# Patient Record
Sex: Male | Born: 1955 | Race: White | Hispanic: No | Marital: Married | Smoking: Never smoker
Health system: Southern US, Community
[De-identification: ages and names within clinical notes are randomized; demographics above are authoritative.]

## PROBLEM LIST (undated history)

## (undated) DIAGNOSIS — I219 Acute myocardial infarction, unspecified: Secondary | ICD-10-CM

## (undated) DIAGNOSIS — G4730 Sleep apnea, unspecified: Secondary | ICD-10-CM

## (undated) DIAGNOSIS — K649 Unspecified hemorrhoids: Secondary | ICD-10-CM

## (undated) DIAGNOSIS — E785 Hyperlipidemia, unspecified: Secondary | ICD-10-CM

## (undated) HISTORY — PX: SPINE SURGERY: SHX786

## (undated) HISTORY — DX: Acute myocardial infarction, unspecified: I21.9

## (undated) HISTORY — DX: Hyperlipidemia, unspecified: E78.5

## (undated) HISTORY — PX: FRACTURE SURGERY: SHX138

---
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
## 2007-04-17 DIAGNOSIS — I219 Acute myocardial infarction, unspecified: Secondary | ICD-10-CM

## 2007-04-17 HISTORY — PX: CARDIAC CATHETERIZATION: SHX172

## 2007-04-17 HISTORY — DX: Acute myocardial infarction, unspecified: I21.9

## 2018-04-17 ENCOUNTER — Ambulatory Visit: Payer: BLUE CROSS/BLUE SHIELD | Admitting: Emergency Medicine

## 2018-04-17 ENCOUNTER — Encounter: Payer: Self-pay | Admitting: Emergency Medicine

## 2018-04-17 VITALS — BP 146/92 | HR 60 | Temp 97.8°F | Resp 17 | Ht 70.5 in | Wt 213.0 lb

## 2018-04-17 DIAGNOSIS — Z8679 Personal history of other diseases of the circulatory system: Secondary | ICD-10-CM | POA: Diagnosis not present

## 2018-04-17 DIAGNOSIS — E785 Hyperlipidemia, unspecified: Secondary | ICD-10-CM | POA: Diagnosis not present

## 2018-04-17 DIAGNOSIS — Z7689 Persons encountering health services in other specified circumstances: Secondary | ICD-10-CM

## 2018-04-17 NOTE — Patient Instructions (Signed)

## 2018-04-17 NOTE — Progress Notes (Signed)
Johnny Boyer 63 y.o.   Chief Complaint  Patient presents with  . Establish Care    HISTORY OF PRESENT ILLNESS: This is a 63 y.o. male here to establish care.  Recently moved to this area from [place].  Has a history of coronary artery disease, status post MI 2009, had 2 stents placed.  Also has a history of high cholesterol and sleep apnea.  HPI   Prior to Admission medications   Medication Sig Start Date End Date Taking? Authorizing Provider  aspirin EC 81 MG tablet Take 81 mg by mouth daily.   Yes [provider]  atorvastatin (LIPITOR) 80 MG tablet Take 80 mg by mouth daily.   Yes [provider]    Not on File  There are no active problems to display for this patient.   Past Medical History:  Diagnosis Date  . Hyperlipidemia   . Myocardial infarction [facility](HCC)       Social History   Socioeconomic History  . Marital status: Married    Spouse name: Not on file  . Number of children: Not on file  . Years of education: Not on file  . Highest education level: Not on file  Occupational History  . Not on file  Social Needs  . Financial resource strain: Not on file  . Food insecurity:    Worry: Not on file    Inability: Not on file  . Transportation needs:    Medical: Not on file    Non-medical: Not on file  Tobacco Use  . Smoking status: Never Smoker  . Smokeless tobacco: Never Used  Substance and Sexual Activity  . Alcohol use: Not on file  . Drug use: Never  . Sexual activity: Never  Lifestyle  . Physical activity:    Days per week: Not on file    Minutes per session: Not on file  . Stress: Not on file  Relationships  . Social connections:    Talks on phone: Not on file    Gets together: Not on file    Attends religious service: Not on file    Active member of club or organization: Not on file    Attends meetings of clubs or organizations: Not on file    Relationship status: Not on file  . Intimate partner violence:    Fear of  current or ex partner: Not on file    Emotionally abused: Not on file    Physically abused: Not on file    Forced sexual activity: Not on file  Other Topics Concern  . Not on file  Social History Narrative  . Not on file    Family History  Problem Relation Age of Onset  . Heart disease Mother   . Hyperlipidemia Mother   . Hyperlipidemia Sister   . Hypertension Sister   . Hyperlipidemia Brother   . Hypertension Brother      Review of Systems  Constitutional: Negative.  Negative for chills and fever.  HENT: Negative.  Negative for sore throat.   Eyes: Negative.  Negative for blurred vision and double vision.  Respiratory: Negative for cough, hemoptysis and shortness of breath.   Cardiovascular: Negative.  Negative for chest pain and palpitations.  Gastrointestinal: Negative.  Negative for abdominal pain, diarrhea, nausea and vomiting.  Genitourinary: Negative.   Skin: Negative.  Negative for rash.  Neurological: Negative for dizziness and headaches.  Endo/Heme/Allergies: Negative.   All other systems reviewed and are negative.   Vitals:   04/17/18  1542  BP: (!) 146/92  Pulse: 60  Resp: 17  Temp: 97.8 F (36.6 C)  SpO2: 98%    Physical Exam Vitals signs reviewed.  Constitutional:      Appearance: Normal appearance.  HENT:     Head: Normocephalic and atraumatic.     Nose: Nose normal.     Mouth/Throat:     Mouth: Mucous membranes are moist.     Pharynx: Oropharynx is clear.  Eyes:     Extraocular Movements: Extraocular movements intact.     Conjunctiva/sclera: Conjunctivae normal.     Pupils: Pupils are equal, round, and reactive to light.  Neck:     Musculoskeletal: Normal range of motion and neck supple.  Cardiovascular:     Rate and Rhythm: Normal rate and regular rhythm.     Heart sounds: Normal heart sounds.  Pulmonary:     Effort: Pulmonary effort is normal.     Breath sounds: Normal breath sounds.  Neurological:     Mental Status: He is alert.       ASSESSMENT & PLAN: Johnny Boyer was seen today for establish care.  Diagnoses and all orders for this visit:  Encounter to establish care  Dyslipidemia  History of coronary artery disease    Patient Instructions  Health Maintenance, Male A healthy lifestyle and preventive care is important for your health and wellness. Ask your health care provider about what schedule of regular examinations is right for you. What should I know about weight and diet? Eat a Healthy Diet  Eat plenty of vegetables, fruits, whole grains, low-fat dairy products, and lean protein.  Do not eat a lot of foods high in solid fats, added sugars, or salt.  Maintain a Healthy Weight Regular exercise can help you achieve or maintain a healthy weight. You should:  Do at least 150 minutes of exercise each week. The exercise should increase your heart rate and make you sweat (moderate-intensity exercise).  Do strength-training exercises at least twice a week. Watch Your Levels of Cholesterol and Blood Lipids  Have your blood tested for lipids and cholesterol every 5 years starting at 64 years of age. If you are at high risk for heart disease, you should start having your blood tested when you are 64 years old. You may need to have your cholesterol levels checked more often if: ? Your lipid or cholesterol levels are high. ? You are older than 64 years of age. ? You are at high risk for heart disease. What should I know about cancer screening? Many types of cancers can be detected early and may often be prevented. Lung Cancer  You should be screened every year for lung cancer if: ? You are a current smoker who has smoked for at least 30 years. ? You are a former smoker who has quit within the past 15 years.  Talk to your health care provider about your screening options, when you should start screening, and how often you should be screened. Colorectal Cancer  Routine colorectal cancer screening usually  begins at 64 years of age and should be repeated every 5-10 years until you are 64 years old. You may need to be screened more often if early forms of precancerous polyps or small growths are found. Your health care provider may recommend screening at an earlier age if you have risk factors for colon cancer.  Your health care provider may recommend using home test kits to check for hidden blood in the stool.  A small  camera at the end of a tube can be used to examine your colon (sigmoidoscopy or colonoscopy). This checks for the earliest forms of colorectal cancer. Prostate and Testicular Cancer  Depending on your age and overall health, your health care provider may do certain tests to screen for prostate and testicular cancer.  Talk to your health care provider about any symptoms or concerns you have about testicular or prostate cancer. Skin Cancer  Check your skin from head to toe regularly.  Tell your health care provider about any new moles or changes in moles, especially if: ? There is a change in a mole's size, shape, or color. ? You have a mole that is larger than a pencil eraser.  Always use sunscreen. Apply sunscreen liberally and repeat throughout the day.  Protect yourself by wearing long sleeves, pants, a wide-brimmed hat, and sunglasses when outside. What should I know about heart disease, diabetes, and high blood pressure?  If you are 63-57 years of age, have your blood pressure checked every 3-5 years. If you are 63 years of age or older, have your blood pressure checked every year. You should have your blood pressure measured twice-once when you are at a hospital or clinic, and once when you are not at a hospital or clinic. Record the average of the two measurements. To check your blood pressure when you are not at a hospital or clinic, you can use: ? An automated blood pressure machine at a pharmacy. ? A home blood pressure monitor.  Talk to your health care provider  about your target blood pressure.  If you are between 28-34 years old, ask your health care provider if you should take aspirin to prevent heart disease.  Have regular diabetes screenings by checking your fasting blood sugar level. ? If you are at a normal weight and have a low risk for diabetes, have this test once every three years after the age of 28. ? If you are overweight and have a high risk for diabetes, consider being tested at a younger age or more often.  A one-time screening for abdominal aortic aneurysm (AAA) by ultrasound is recommended for men aged 65-75 years who are current or former smokers. What should I know about preventing infection? Hepatitis B If you have a higher risk for hepatitis B, you should be screened for this virus. Talk with your health care provider to find out if you are at risk for hepatitis B infection. Hepatitis C Blood testing is recommended for:  Everyone born from [birth year] through 1966.  Anyone with known risk factors for hepatitis C. Sexually Transmitted Diseases (STDs)  You should be screened each year for STDs including gonorrhea and chlamydia if: ? You are sexually active and are younger than 64 years of age. ? You are older than 64 years of age and your health care provider tells you that you are at risk for this type of infection. ? Your sexual activity has changed since you were last screened and you are at an increased risk for chlamydia or gonorrhea. Ask your health care provider if you are at risk.  Talk with your health care provider about whether you are at high risk of being infected with HIV. Your health care provider may recommend a prescription medicine to help prevent HIV infection. What else can I do?  Schedule regular health, dental, and eye exams.  Stay current with your vaccines (immunizations).  Do not use any tobacco products, such as cigarettes, chewing tobacco,  and e-cigarettes. If you need help quitting, ask your health  care provider.  Limit alcohol intake to no more than 2 drinks per day. One drink equals 12 ounces of beer, 5 ounces of wine, or 1 ounces of hard liquor.  Do not use street drugs.  Do not share needles.  Ask your health care provider for help if you need support or information about quitting drugs.  Tell your health care provider if you often feel depressed.  Tell your health care provider if you have ever been abused or do not feel safe at home. This information is not intended to replace advice given to you by your health care provider. Make sure you discuss any questions you have with your health care provider. Document Released: 09/29/2007 Document Revised: 11/30/2015 Document Reviewed: 01/04/2015 Elsevier Interactive Patient Education  2019 Elsevier Inc.      Edwina BarthMiguel Charlot Gouin, MD [facility]

## 2018-06-05 DIAGNOSIS — J00 Acute nasopharyngitis [common cold]: Secondary | ICD-10-CM | POA: Diagnosis not present

## 2018-12-03 ENCOUNTER — Encounter: Payer: Self-pay | Admitting: Emergency Medicine

## 2018-12-03 ENCOUNTER — Telehealth (INDEPENDENT_AMBULATORY_CARE_PROVIDER_SITE_OTHER): Payer: BC Managed Care – PPO | Admitting: Emergency Medicine

## 2018-12-03 ENCOUNTER — Other Ambulatory Visit: Payer: Self-pay

## 2018-12-03 VITALS — Ht 69.0 in | Wt 210.0 lb

## 2018-12-03 DIAGNOSIS — E785 Hyperlipidemia, unspecified: Secondary | ICD-10-CM

## 2018-12-03 DIAGNOSIS — Z8679 Personal history of other diseases of the circulatory system: Secondary | ICD-10-CM

## 2018-12-03 DIAGNOSIS — Z1211 Encounter for screening for malignant neoplasm of colon: Secondary | ICD-10-CM | POA: Diagnosis not present

## 2018-12-03 DIAGNOSIS — K644 Residual hemorrhoidal skin tags: Secondary | ICD-10-CM | POA: Diagnosis not present

## 2018-12-03 MED ORDER — HYDROCORTISONE (PERIANAL) 2.5 % EX CREA: 1.0000 "application " | TOPICAL_CREAM | Freq: Two times a day (BID) | CUTANEOUS | 0 refills | Status: DC

## 2018-12-03 NOTE — Progress Notes (Signed)
Telemedicine Encounter- SOAP NOTE Established Patient  This telephone encounter was conducted with the patient's (or proxy's) verbal consent via audio telecommunications: yes/no: Yes Patient was instructed to have this encounter in a suitably private space; and to only have persons present to whom they give permission to participate. In addition, patient identity was confirmed by use of name plus two identifiers (DOB and address).  I discussed the limitations, risks, security and privacy concerns of performing an evaluation and management service by telephone and the availability of in person appointments. I also discussed with the patient that there may be a patient responsible charge related to this service. The patient expressed understanding and agreed to proceed.  I spent a total of TIME; 0 MIN TO 60 MIN: 15 minutes talking with the patient or their proxy.  No chief complaint on file. Requesting blood work  Subjective   Johnny Boyer is a 63 y.o. male established patient. Telephone visit today for follow-up of chronic medical problems.  Requesting blood work.  Patient has history of coronary artery disease and dyslipidemia.  On atorvastatin. Also inquiring about Cologuard. Also complaining of painful external hemorrhoids. No other complaints or medical concerns today.  HPI   There are no active problems to display for this patient.   Past Medical History:  Diagnosis Date  . Hyperlipidemia   . Myocardial infarction [facility])     Current Outpatient Medications  Medication Sig Dispense Refill  . aspirin EC 81 MG tablet Take 81 mg by mouth daily.    Marland Kitchen atorvastatin (LIPITOR) 80 MG tablet Take 80 mg by mouth daily.     No current facility-administered medications for this visit.     No Known Allergies  Social History   Socioeconomic History  . Marital status: Married    Spouse name: Not on file  . Number of children: Not on file  . Years of education: Not on file  .  Highest education level: Not on file  Occupational History  . Not on file  Social Needs  . Financial resource strain: Not on file  . Food insecurity    Worry: Not on file    Inability: Not on file  . Transportation needs    Medical: Not on file    Non-medical: Not on file  Tobacco Use  . Smoking status: Never Smoker  . Smokeless tobacco: Never Used  Substance and Sexual Activity  . Alcohol use: Not on file  . Drug use: Never  . Sexual activity: Never  Lifestyle  . Physical activity    Days per week: Not on file    Minutes per session: Not on file  . Stress: Not on file  Relationships  . Social Herbalist on phone: Not on file    Gets together: Not on file    Attends religious service: Not on file    Active member of club or organization: Not on file    Attends meetings of clubs or organizations: Not on file    Relationship status: Not on file  . Intimate partner violence    Fear of current or ex partner: Not on file    Emotionally abused: Not on file    Physically abused: Not on file    Forced sexual activity: Not on file  Other Topics Concern  . Not on file  Social History Narrative  . Not on file    Review of Systems  Constitutional: Negative.  Negative for chills and fever.  HENT: Negative for congestion and sore throat.   Respiratory: Negative.  Negative for cough and shortness of breath.   Cardiovascular: Negative.  Negative for chest pain and palpitations.  Gastrointestinal:       Painful external hemorrhoids  Genitourinary: Negative.   Musculoskeletal: Negative.  Negative for myalgias.  Skin: Negative for rash.  Neurological: Negative for dizziness and headaches.  All other systems reviewed and are negative.   Objective   Vitals as reported by the patient: Today's Vitals   12/03/18 1320  Weight: 210 lb (95.3 kg)  Height: 5\' 9"  (1.753 m)  Alert and oriented x3 in no apparent respiratory distress  There are no diagnoses linked to this  encounter.  Diagnoses and all orders for this visit:  External hemorrhoids -     hydrocortisone (ANUSOL-HC) 2.5 % rectal cream; Place 1 application rectally 2 (two) times daily. -     CBC with Differential/Platelet; Future  Dyslipidemia -     Hemoglobin A1c; Future -     Comprehensive metabolic panel; Future -     Lipid panel; Future  History of coronary artery disease -     Hemoglobin A1c; Future -     Comprehensive metabolic panel; Future  Colon cancer screening -     Cologuard; Future    Clinically stable.  No medical concerns identified during this visit. Blood work requested.  Cologuard requested. Office visit in 6 months.  I discussed the assessment and treatment plan with the patient. The patient was provided an opportunity to ask questions and all were answered. The patient agreed with the plan and demonstrated an understanding of the instructions.   The patient was advised to call back or seek an in-person evaluation if the symptoms worsen or if the condition fails to improve as anticipated.  I provided 15 minutes of non-face-to-face time during this encounter.  Georgina QuintMiguel Jose Fatema Rabe, MD  [facility]

## 2018-12-03 NOTE — Progress Notes (Signed)
Requested to get labs work done  Normal labs.  Cont.

## 2018-12-04 ENCOUNTER — Telehealth: Payer: Self-pay | Admitting: Emergency Medicine

## 2018-12-04 NOTE — Telephone Encounter (Signed)
LVM to schedule lab appt for 12/05/2018 per Dr. Mitchel Honour

## 2018-12-04 NOTE — Progress Notes (Signed)
LVM to schedule appt

## 2018-12-05 ENCOUNTER — Ambulatory Visit (INDEPENDENT_AMBULATORY_CARE_PROVIDER_SITE_OTHER): Payer: BC Managed Care – PPO | Admitting: Family Medicine

## 2018-12-05 ENCOUNTER — Other Ambulatory Visit: Payer: Self-pay

## 2018-12-05 DIAGNOSIS — E785 Hyperlipidemia, unspecified: Secondary | ICD-10-CM

## 2018-12-05 DIAGNOSIS — K644 Residual hemorrhoidal skin tags: Secondary | ICD-10-CM

## 2018-12-05 DIAGNOSIS — Z8679 Personal history of other diseases of the circulatory system: Secondary | ICD-10-CM

## 2018-12-06 ENCOUNTER — Encounter: Payer: Self-pay | Admitting: Emergency Medicine

## 2018-12-06 LAB — COMPREHENSIVE METABOLIC PANEL
ALT: 31 IU/L (ref 0–44)
AST: 32 IU/L (ref 0–40)
Albumin/Globulin Ratio: 2 (ref 1.2–2.2)
Albumin: 4.5 g/dL (ref 3.8–4.8)
Alkaline Phosphatase: 101 IU/L (ref 39–117)
BUN/Creatinine Ratio: 18 (ref 10–24)
BUN: 22 mg/dL (ref 8–27)
Bilirubin Total: 0.4 mg/dL (ref 0.0–1.2)
CO2: 21 mmol/L (ref 20–29)
Calcium: 9.4 mg/dL (ref 8.6–10.2)
Chloride: 103 mmol/L (ref 96–106)
Creatinine, Ser: 1.19 mg/dL (ref 0.76–1.27)
GFR calc Af Amer: 74 mL/min/{1.73_m2} (ref >59)
GFR calc non Af Amer: 64 mL/min/{1.73_m2} (ref >59)
Globulin, Total: 2.3 g/dL (ref 1.5–4.5)
Glucose: 92 mg/dL (ref 65–99)
Potassium: 4.3 mmol/L (ref 3.5–5.2)
Sodium: 140 mmol/L (ref 134–144)
Total Protein: 6.8 g/dL (ref 6.0–8.5)

## 2018-12-06 LAB — CBC WITH DIFFERENTIAL/PLATELET
Basophils Absolute: 0.1 10*3/uL (ref 0.0–0.2)
Basos: 1 %
EOS (ABSOLUTE): 0.1 10*3/uL (ref 0.0–0.4)
Eos: 2 %
Hematocrit: 41.8 % (ref 37.5–51.0)
Hemoglobin: 14.5 g/dL (ref 13.0–17.7)
Immature Grans (Abs): 0 10*3/uL (ref 0.0–0.1)
Immature Granulocytes: 0 %
Lymphocytes Absolute: 3.1 10*3/uL (ref 0.7–3.1)
Lymphs: 41 %
MCH: 31.3 pg (ref 26.6–33.0)
MCHC: 34.7 g/dL (ref 31.5–35.7)
MCV: 90 fL (ref 79–97)
Monocytes Absolute: 0.7 10*3/uL (ref 0.1–0.9)
Monocytes: 9 %
Neutrophils Absolute: 3.6 10*3/uL (ref 1.4–7.0)
Neutrophils: 47 %
Platelets: 191 10*3/uL (ref 150–450)
RBC: 4.64 x10E6/uL (ref 4.14–5.80)
RDW: 13.8 % (ref 11.6–15.4)
WBC: 7.5 10*3/uL (ref 3.4–10.8)

## 2018-12-06 LAB — LIPID PANEL
Chol/HDL Ratio: 3.3 ratio (ref 0.0–5.0)
Cholesterol, Total: 184 mg/dL (ref 100–199)
HDL: 55 mg/dL (ref >39)
LDL Calculated: 109 mg/dL — ABNORMAL HIGH (ref 0–99)
Triglycerides: 102 mg/dL (ref 0–149)
VLDL Cholesterol Cal: 20 mg/dL (ref 5–40)

## 2018-12-06 LAB — HEMOGLOBIN A1C
Est. average glucose Bld gHb Est-mCnc: 114 mg/dL
Hgb A1c MFr Bld: 5.6 % (ref 4.8–5.6)

## 2018-12-27 ENCOUNTER — Encounter: Payer: Self-pay | Admitting: Emergency Medicine

## 2018-12-28 NOTE — Progress Notes (Signed)
labs

## 2019-01-14 ENCOUNTER — Encounter: Payer: Self-pay | Admitting: Emergency Medicine

## 2019-01-19 ENCOUNTER — Other Ambulatory Visit: Payer: Self-pay

## 2019-01-19 DIAGNOSIS — Z1211 Encounter for screening for malignant neoplasm of colon: Secondary | ICD-10-CM

## 2019-03-03 LAB — COLOGUARD: Cologuard: NEGATIVE

## 2019-06-22 ENCOUNTER — Telehealth: Payer: Self-pay | Admitting: Emergency Medicine

## 2019-06-22 NOTE — Telephone Encounter (Signed)
Pt dropped off COVID vaccine form for pcp to fill. Section E only. Form given to [place]. She will give to provider

## 2019-08-18 ENCOUNTER — Other Ambulatory Visit: Payer: Self-pay

## 2019-08-18 ENCOUNTER — Encounter: Payer: Self-pay | Admitting: Emergency Medicine

## 2019-08-18 DIAGNOSIS — W57XXXA Bitten or stung by nonvenomous insect and other nonvenomous arthropods, initial encounter: Secondary | ICD-10-CM

## 2019-08-18 NOTE — Telephone Encounter (Signed)
Spoke with pt, pt has been scheduled to come in for lab draw on Friday, labs have been placed

## 2019-08-20 ENCOUNTER — Encounter: Payer: Self-pay | Admitting: Emergency Medicine

## 2019-08-21 ENCOUNTER — Ambulatory Visit: Payer: BC Managed Care – PPO | Admitting: Family Medicine

## 2019-08-21 ENCOUNTER — Other Ambulatory Visit: Payer: Self-pay

## 2019-08-21 DIAGNOSIS — W57XXXA Bitten or stung by nonvenomous insect and other nonvenomous arthropods, initial encounter: Secondary | ICD-10-CM

## 2019-08-21 NOTE — Progress Notes (Unsigned)
Lab only visit 

## 2019-08-24 LAB — RICKETTSIAL FEVER GROUP IGG/M
Spotted Fever Group IgG: <1:64 {titer}
Spotted Fever Group IgM: <1:64 {titer}
Typhus Fever Group IgG: <1:64 {titer}
Typhus Fever Group IgM: <1:64 {titer}

## 2019-08-24 LAB — LYME AB/WESTERN BLOT REFLEX
LYME DISEASE AB, QUANT, IGM: <0.8 index (ref 0.00–0.79)
Lyme IgG/IgM Ab: <0.91 {ISR} (ref 0.00–0.90)

## 2019-09-30 ENCOUNTER — Encounter: Payer: Self-pay | Admitting: Emergency Medicine

## 2019-10-15 ENCOUNTER — Telehealth: Payer: Self-pay | Admitting: Emergency Medicine

## 2019-10-15 DIAGNOSIS — Z1211 Encounter for screening for malignant neoplasm of colon: Secondary | ICD-10-CM

## 2019-10-15 DIAGNOSIS — E785 Hyperlipidemia, unspecified: Secondary | ICD-10-CM

## 2019-10-15 DIAGNOSIS — Z131 Encounter for screening for diabetes mellitus: Secondary | ICD-10-CM

## 2019-10-15 NOTE — Telephone Encounter (Signed)
Please order fasting labs for physical scheduled for 12/15/2019

## 2019-10-16 NOTE — Telephone Encounter (Signed)
Labs signed for Future.

## 2019-10-31 ENCOUNTER — Emergency Department (HOSPITAL_COMMUNITY)
Admission: EM | Admit: 2019-10-31 | Discharge: 2019-10-31 | Disposition: A | Discharge status: Home / Self Care | Payer: BC Managed Care – PPO | Attending: Emergency Medicine | Admitting: Emergency Medicine

## 2019-10-31 ENCOUNTER — Other Ambulatory Visit: Payer: Self-pay

## 2019-10-31 ENCOUNTER — Encounter (HOSPITAL_COMMUNITY): Payer: Self-pay

## 2019-10-31 DIAGNOSIS — E860 Dehydration: Secondary | ICD-10-CM

## 2019-10-31 DIAGNOSIS — Z7982 Long term (current) use of aspirin: Secondary | ICD-10-CM | POA: Diagnosis not present

## 2019-10-31 DIAGNOSIS — Z79899 Other long term (current) drug therapy: Secondary | ICD-10-CM | POA: Diagnosis not present

## 2019-10-31 LAB — URINALYSIS, ROUTINE W REFLEX MICROSCOPIC
Bilirubin Urine: NEGATIVE
Glucose, UA: NEGATIVE mg/dL
Ketones, ur: NEGATIVE mg/dL
Leukocytes,Ua: NEGATIVE
Nitrite: NEGATIVE
Protein, ur: NEGATIVE mg/dL
Specific Gravity, Urine: 1.01 (ref 1.005–1.030)
pH: 7 (ref 5.0–8.0)

## 2019-10-31 LAB — BASIC METABOLIC PANEL
Anion gap: 17 — ABNORMAL HIGH (ref 5–15)
BUN: 22 mg/dL (ref 8–23)
CO2: 18 mmol/L — ABNORMAL LOW (ref 22–32)
Calcium: 9 mg/dL (ref 8.9–10.3)
Chloride: 101 mmol/L (ref 98–111)
Creatinine, Ser: 2.03 mg/dL — ABNORMAL HIGH (ref 0.61–1.24)
GFR calc Af Amer: 39 mL/min — ABNORMAL LOW (ref >60)
GFR calc non Af Amer: 34 mL/min — ABNORMAL LOW (ref >60)
Glucose, Bld: 85 mg/dL (ref 70–99)
Potassium: 3.4 mmol/L — ABNORMAL LOW (ref 3.5–5.1)
Sodium: 136 mmol/L (ref 135–145)

## 2019-10-31 LAB — CBC WITH DIFFERENTIAL/PLATELET
Abs Immature Granulocytes: 0.04 10*3/uL (ref 0.00–0.07)
Basophils Absolute: 0.1 10*3/uL (ref 0.0–0.1)
Basophils Relative: 1 %
Eosinophils Absolute: 0 10*3/uL (ref 0.0–0.5)
Eosinophils Relative: 0 %
HCT: 39.5 % (ref 39.0–52.0)
Hemoglobin: 13.8 g/dL (ref 13.0–17.0)
Immature Granulocytes: 0 %
Lymphocytes Relative: 12 %
Lymphs Abs: 1.6 10*3/uL (ref 0.7–4.0)
MCH: 31.2 pg (ref 26.0–34.0)
MCHC: 34.9 g/dL (ref 30.0–36.0)
MCV: 89.4 fL (ref 80.0–100.0)
Monocytes Absolute: 1.1 10*3/uL — ABNORMAL HIGH (ref 0.1–1.0)
Monocytes Relative: 8 %
Neutro Abs: 10.4 10*3/uL — ABNORMAL HIGH (ref 1.7–7.7)
Neutrophils Relative %: 79 %
Platelets: 170 10*3/uL (ref 150–400)
RBC: 4.42 MIL/uL (ref 4.22–5.81)
RDW: 12.6 % (ref 11.5–15.5)
WBC: 13.2 10*3/uL — ABNORMAL HIGH (ref 4.0–10.5)
nRBC: 0 % (ref 0.0–0.2)

## 2019-10-31 LAB — TROPONIN I (HIGH SENSITIVITY)
Troponin I (High Sensitivity): 20 ng/L — ABNORMAL HIGH (ref <18)
Troponin I (High Sensitivity): 19 ng/L — ABNORMAL HIGH (ref <18)

## 2019-10-31 LAB — CK: Total CK: 220 U/L (ref 49–397)

## 2019-10-31 NOTE — Discharge Instructions (Signed)
You are seen today for heat exposure and dehydration.  Overall, your work-up is reassuring.  It did appear that your kidney function was slightly increased today from your last kidney function 11 months ago.  However, this is likely from the dehydration and the fluids should fix this.  You should follow-up with your primary care doctor in 1 week as well for reevaluation.  Initially, your heart enzyme called troponin was also elevated which would be normal in the setting of running as much as you did.  We did a second troponin which was decreasing so this is reassuring.  If you have any new or worsening symptoms that you need to return to the emergency department.  Continue to drink plenty of fluids and stay hydrated and rest.

## 2019-10-31 NOTE — ED Notes (Signed)
CBC clotted per lab, new order placed

## 2019-10-31 NOTE — ED Provider Notes (Signed)
[facility] EMERGENCY DEPARTMENT Provider Note   CSN: [id number] Arrival date & time: 10/31/19  1127     History Chief Complaint  Patient presents with  . Dehydration  . Heat Exposure    Johnny Boyer is a 64 y.o. male.  Patient is a 64 year old gentleman who has a past medical history of hyperlipidemia and myocardial infarction. He takes aspirin and atorvastatin. He is presenting to the emergency department for lightheadedness and heat exposure. Patient reports that he is an avid runner and runs frequently. He was attempting to run the longest that he has run before which is 18 miles when, at mile 16, he began to feel fatigued and lightheaded. He sat down on the ground near his daughter and then had to lay down due to the severity of the symptoms. He did not lose consciousness. 911 was called and fluids were started. He reports now that he feels very fatigued and he is having cramps in his muscles all over. Patient reports that he usually runs 3 to 5 miles during the week but once a week he runs an extended amount of time. Last week he did complete 17 miles without any issue. Reports that he was staying hydrated as usual throughout his run and preceding the days before today. Denies any chest pain, shortness of breath, palpitations        Past Medical History:  Diagnosis Date  . Hyperlipidemia   . Myocardial infarction (HCC)     There are no problems to display for this patient.   Past Surgical History:  Procedure Laterality Date  . FRACTURE SURGERY    . SPINE SURGERY         Family History  Problem Relation Age of Onset  . Heart disease Mother   . Hyperlipidemia Mother   . Hyperlipidemia Sister   . Hypertension Sister   . Hyperlipidemia Brother   . Hypertension Brother     Social History   Tobacco Use  . Smoking status: Never Smoker  . Smokeless tobacco: Never Used  Substance Use Topics  . Alcohol use: Not on file  . Drug use: Never     Home Medications Prior to Admission medications   Medication Sig Start Date End Date Taking? Authorizing Provider  aspirin EC 81 MG tablet Take 81 mg by mouth daily.    [provider]  atorvastatin (LIPITOR) 80 MG tablet Take 80 mg by mouth daily.    [provider]  hydrocortisone (ANUSOL-HC) 2.5 % rectal cream Place 1 application rectally 2 (two) times daily. 12/03/18   Georgina Quint, MD    Allergies    Patient has no known allergies.  Review of Systems   Review of Systems  Constitutional: Positive for fatigue. Negative for chills and fever.  HENT: Negative for congestion.   Respiratory: Negative for cough and shortness of breath.   Cardiovascular: Negative for chest pain, palpitations and leg swelling.  Gastrointestinal: Negative for abdominal pain, diarrhea, nausea and vomiting.  Musculoskeletal: Positive for myalgias. Negative for back pain.  Skin: Negative for rash.  Neurological: Positive for light-headedness. Negative for dizziness, syncope, speech difficulty and numbness.    Physical Exam Updated Vital Signs BP (!) 141/88   Pulse 60   Temp 97.8 F (36.6 C) (Oral)   Resp (!) 23   Ht 5\' 9"  (1.753 m)   Wt 86.2 kg   SpO2 99%   BMI 28.06 kg/m   Physical Exam Vitals and nursing note reviewed.  Constitutional:      General: He is not in acute distress.    Appearance: Normal appearance. He is not ill-appearing, toxic-appearing or diaphoretic.  HENT:     Head: Normocephalic.  Eyes:     Conjunctiva/sclera: Conjunctivae normal.  Cardiovascular:     Rate and Rhythm: Normal rate and regular rhythm.  Pulmonary:     Effort: Pulmonary effort is normal.  Musculoskeletal:     Comments: He does have occasional rigors.   Skin:    General: Skin is dry.     Capillary Refill: Capillary refill takes less than 2 seconds.  Neurological:     General: No focal deficit present.     Mental Status: He is alert and oriented to person, place, and time.   Psychiatric:        Mood and Affect: Mood normal.     ED Results / Procedures / Treatments   Labs (all labs ordered are listed, but only abnormal results are displayed) Labs Reviewed  BASIC METABOLIC PANEL - Abnormal; Notable for the following components:      Result Value   Potassium 3.4 (*)    CO2 18 (*)    Creatinine, Ser 2.03 (*)    GFR calc non Af Amer 34 (*)    GFR calc Af Amer 39 (*)    Anion gap 17 (*)    All other components within normal limits  URINALYSIS, ROUTINE W REFLEX MICROSCOPIC - Abnormal; Notable for the following components:   Hgb urine dipstick SMALL (*)    Bacteria, UA RARE (*)    All other components within normal limits  CBC WITH DIFFERENTIAL/PLATELET - Abnormal; Notable for the following components:   WBC 13.2 (*)    Neutro Abs 10.4 (*)    Monocytes Absolute 1.1 (*)    All other components within normal limits  TROPONIN I (HIGH SENSITIVITY) - Abnormal; Notable for the following components:   Troponin I (High Sensitivity) 20 (*)    All other components within normal limits  TROPONIN I (HIGH SENSITIVITY) - Abnormal; Notable for the following components:   Troponin I (High Sensitivity) 19 (*)    All other components within normal limits  CK  CBC WITH DIFFERENTIAL/PLATELET    EKG None  Radiology No results found.  Procedures Procedures (including critical care time)  Medications Ordered in ED Medications - No data to display  ED Course  I have reviewed the triage vital signs and the nursing notes.  Pertinent labs & imaging results that were available during my care of the patient were reviewed by me and considered in my medical decision making (see chart for details).  Clinical Course as of Oct 31 1602  Sat Oct 31, 2019  1356 Patient presenting with extreme fatigue and near syncope after running several miles a day.  He does run regularly.  Also has a history of MI in the past.  Has not had chest pain or shortness of breath.  Work-up  reveals a normal CK, he has a creatinine of 2.3 which is increased from his baseline 11 months ago which was normal.  Will obtain a troponin given his history which was twenty today.  We will repeat this.  He was given a liter of fluids and he is now feeling significantly improved.  If his troponin is flat or decreasing he may go home.  However, if his troponin is increasing we will likely consult with cardiology   [KM]  1603 Patient is feeling much better,  trop down trending. He would like to go home. Discussed f/u with PMD and return precautions.    [KM]    Clinical Course User Index [KM] Jeral Pinch   MDM Rules/Calculators/A&P                          Based on review of vitals, medical screening exam, lab work and/or imaging, there does not appear to be an acute, emergent etiology for the patient's symptoms. Counseled pt on good return precautions and encouraged both PCP and ED follow-up as needed.  Prior to discharge, I also discussed incidental imaging findings with patient in detail and advised appropriate, recommended follow-up in detail.  Clinical Impression: 1. Dehydration     Disposition: Discharge  Prior to providing a prescription for a controlled substance, I independently reviewed the patient's recent prescription history on the [place] Controlled Substance Reporting System. The patient had no recent or regular prescriptions and was deemed appropriate for a brief, less than 3 day prescription of narcotic for acute analgesia.  This note was prepared with assistance of Conservation officer, historic buildings. Occasional wrong-word or sound-a-like substitutions may have occurred due to the inherent limitations of voice recognition software.  Final Clinical Impression(s) / ED Diagnoses Final diagnoses:  Dehydration    Rx / DC Orders ED Discharge Orders    None       Jeral Pinch 10/31/19 1604    Geoffery Lyons, MD 10/31/19 2040

## 2019-10-31 NOTE — ED Triage Notes (Signed)
[place] [facility] [place] c/o dehydration/heat exposure after running 16 mi. Pt experiencing tremors/cramping, received 500 mL NS per EMS. Outside approx 4 hours, cardiac, hx, NSR att. VS stable. A&Ox4.

## 2019-11-02 ENCOUNTER — Encounter: Payer: Self-pay | Admitting: Emergency Medicine

## 2019-11-03 NOTE — Telephone Encounter (Signed)
EKG looks normal!

## 2019-11-04 ENCOUNTER — Telehealth: Payer: Self-pay | Admitting: Emergency Medicine

## 2019-11-04 DIAGNOSIS — Z1159 Encounter for screening for other viral diseases: Secondary | ICD-10-CM

## 2019-11-04 NOTE — Telephone Encounter (Signed)
Noted. Orders have been placed.

## 2019-11-04 NOTE — Telephone Encounter (Signed)
Pt sent in a request to have Hepatitis C screening added to there nurse visit for their cpe on 12/10/19. Please advise.

## 2019-12-10 ENCOUNTER — Other Ambulatory Visit: Payer: Self-pay

## 2019-12-10 ENCOUNTER — Ambulatory Visit (INDEPENDENT_AMBULATORY_CARE_PROVIDER_SITE_OTHER): Payer: Medicare Other | Admitting: Emergency Medicine

## 2019-12-10 DIAGNOSIS — Z131 Encounter for screening for diabetes mellitus: Secondary | ICD-10-CM

## 2019-12-10 DIAGNOSIS — Z1159 Encounter for screening for other viral diseases: Secondary | ICD-10-CM

## 2019-12-10 DIAGNOSIS — E785 Hyperlipidemia, unspecified: Secondary | ICD-10-CM

## 2019-12-11 LAB — CMP14+EGFR
ALT: 34 IU/L (ref 0–44)
AST: 32 IU/L (ref 0–40)
Albumin/Globulin Ratio: 1.9 (ref 1.2–2.2)
Albumin: 4.5 g/dL (ref 3.8–4.8)
Alkaline Phosphatase: 122 IU/L — ABNORMAL HIGH (ref 48–121)
BUN/Creatinine Ratio: 18 (ref 10–24)
BUN: 20 mg/dL (ref 8–27)
Bilirubin Total: 0.7 mg/dL (ref 0.0–1.2)
CO2: 23 mmol/L (ref 20–29)
Calcium: 9.7 mg/dL (ref 8.6–10.2)
Chloride: 100 mmol/L (ref 96–106)
Creatinine, Ser: 1.14 mg/dL (ref 0.76–1.27)
GFR calc Af Amer: 78 mL/min/{1.73_m2} (ref >59)
GFR calc non Af Amer: 67 mL/min/{1.73_m2} (ref >59)
Globulin, Total: 2.4 g/dL (ref 1.5–4.5)
Glucose: 77 mg/dL (ref 65–99)
Potassium: 5.3 mmol/L — ABNORMAL HIGH (ref 3.5–5.2)
Sodium: 139 mmol/L (ref 134–144)
Total Protein: 6.9 g/dL (ref 6.0–8.5)

## 2019-12-11 LAB — HEMOGLOBIN A1C
Est. average glucose Bld gHb Est-mCnc: 117 mg/dL
Hgb A1c MFr Bld: 5.7 % — ABNORMAL HIGH (ref 4.8–5.6)

## 2019-12-11 LAB — LIPID PANEL
Chol/HDL Ratio: 3.2 ratio (ref 0.0–5.0)
Cholesterol, Total: 181 mg/dL (ref 100–199)
HDL: 56 mg/dL (ref >39)
LDL Chol Calc (NIH): 104 mg/dL — ABNORMAL HIGH (ref 0–99)
Triglycerides: 121 mg/dL (ref 0–149)
VLDL Cholesterol Cal: 21 mg/dL (ref 5–40)

## 2019-12-11 LAB — HEPATITIS C ANTIBODY: Hep C Virus Ab: <0.1 s/co ratio (ref 0.0–0.9)

## 2019-12-15 ENCOUNTER — Other Ambulatory Visit: Payer: Self-pay

## 2019-12-15 ENCOUNTER — Ambulatory Visit (INDEPENDENT_AMBULATORY_CARE_PROVIDER_SITE_OTHER): Payer: Medicare Other | Admitting: Emergency Medicine

## 2019-12-15 ENCOUNTER — Encounter: Payer: Self-pay | Admitting: Emergency Medicine

## 2019-12-15 VITALS — BP 157/92 | HR 50 | Temp 98.0°F | Resp 16 | Ht 69.0 in | Wt 197.0 lb

## 2019-12-15 DIAGNOSIS — E785 Hyperlipidemia, unspecified: Secondary | ICD-10-CM | POA: Diagnosis not present

## 2019-12-15 DIAGNOSIS — Z8679 Personal history of other diseases of the circulatory system: Secondary | ICD-10-CM | POA: Diagnosis not present

## 2019-12-15 DIAGNOSIS — Z23 Encounter for immunization: Secondary | ICD-10-CM

## 2019-12-15 DIAGNOSIS — K644 Residual hemorrhoidal skin tags: Secondary | ICD-10-CM | POA: Diagnosis not present

## 2019-12-15 DIAGNOSIS — Z0000 Encounter for general adult medical examination without abnormal findings: Secondary | ICD-10-CM

## 2019-12-15 MED ORDER — ATORVASTATIN CALCIUM 80 MG PO TABS: 80.0000 mg | ORAL_TABLET | Freq: Every day | ORAL | 3 refills | Status: DC

## 2019-12-15 NOTE — Progress Notes (Signed)
Johnny Boyer 64 y.o.   Chief Complaint  Patient presents with  . Annual Exam  . Medication Refill    Atorvastatin    HISTORY OF PRESENT ILLNESS: This is a 65 y.o. male here for annual exam. Has history of dyslipidemia and coronary artery disease status post MI 2009. Doing well.  Exercises regularly.  No chest pain on exertion. Takes atorvastatin.  Needs medication refill. Complaining of external hemorrhoids. Fully vaccinated against Covid. Up-to-date with colonoscopy and Cologuard. Recent blood work unremarkable labs with acceptable values. Completely recovered kidney function test.  July 2021 visit to ED for dehydration showed acute kidney injury which has completely resolved now. No other complaints or medical concerns.  HPI   Prior to Admission medications   Medication Sig Start Date End Date Taking? Authorizing Provider  aspirin EC 81 MG tablet Take 81 mg by mouth daily.   Yes [provider]  atorvastatin (LIPITOR) 80 MG tablet Take 80 mg by mouth daily.   Yes [provider]  hydrocortisone (ANUSOL-HC) 2.5 % rectal cream Place 1 application rectally 2 (two) times daily. 12/03/18   Georgina Quint, MD    No Known Allergies  There are no problems to display for this patient.   Past Medical History:  Diagnosis Date  . Hyperlipidemia   . Myocardial infarction [facility])     Past Surgical History:  Procedure Laterality Date  . FRACTURE SURGERY    . SPINE SURGERY      Social History   Socioeconomic History  . Marital status: Married    Spouse name: Not on file  . Number of children: Not on file  . Years of education: Not on file  . Highest education level: Not on file  Occupational History  . Not on file  Tobacco Use  . Smoking status: Never Smoker  . Smokeless tobacco: Never Used  Substance and Sexual Activity  . Alcohol use: Not on file  . Drug use: Never  . Sexual activity: Never  Other Topics Concern  . Not on file  Social  History Narrative  . Not on file   Social Determinants of Health   Financial Resource Strain:   . Difficulty of Paying Living Expenses: Not on file  Food Insecurity:   . Worried About Programme researcher, broadcasting/film/video in the Last Year: Not on file  . Ran Out of Food in the Last Year: Not on file  Transportation Needs:   . Lack of Transportation (Medical): Not on file  . Lack of Transportation (Non-Medical): Not on file  Physical Activity:   . Days of Exercise per Week: Not on file  . Minutes of Exercise per Session: Not on file  Stress:   . Feeling of Stress : Not on file  Social Connections:   . Frequency of Communication with Friends and Family: Not on file  . Frequency of Social Gatherings with Friends and Family: Not on file  . Attends Religious Services: Not on file  . Active Member of Clubs or Organizations: Not on file  . Attends Banker Meetings: Not on file  . Marital Status: Not on file  Intimate Partner Violence:   . Fear of Current or Ex-Partner: Not on file  . Emotionally Abused: Not on file  . Physically Abused: Not on file  . Sexually Abused: Not on file    Family History  Problem Relation Age of Onset  . Heart disease Mother   . Hyperlipidemia Mother   . Hyperlipidemia  Sister   . Hypertension Sister   . Hyperlipidemia Brother   . Hypertension Brother      Review of Systems  Constitutional: Negative.  Negative for chills and fever.  HENT: Negative.  Negative for congestion and sore throat.   Respiratory: Negative.  Negative for cough and shortness of breath.   Cardiovascular: Negative.  Negative for chest pain and palpitations.  Gastrointestinal: Negative.  Negative for abdominal pain, blood in stool, diarrhea, melena, nausea and vomiting.  Genitourinary: Negative.  Negative for dysuria and hematuria.  Musculoskeletal: Negative.  Negative for myalgias and neck pain.  Skin: Negative.   Neurological: Negative.  Negative for dizziness and headaches.    All other systems reviewed and are negative.    Physical Exam Vitals reviewed.  Constitutional:      Appearance: Normal appearance.  HENT:     Head: Normocephalic.  Eyes:     Extraocular Movements: Extraocular movements intact.     Conjunctiva/sclera: Conjunctivae normal.     Pupils: Pupils are equal, round, and reactive to light.  Cardiovascular:     Rate and Rhythm: Normal rate and regular rhythm.     Pulses: Normal pulses.     Heart sounds: Normal heart sounds.  Pulmonary:     Effort: Pulmonary effort is normal.     Breath sounds: Normal breath sounds.  Abdominal:     General: Bowel sounds are normal. There is no distension.     Palpations: Abdomen is soft. There is no mass.     Tenderness: There is no abdominal tenderness.  Genitourinary:    Rectum: External hemorrhoid (Not swollen or thrombosed.  No bleeding.) present.  Musculoskeletal:     Cervical back: Normal range of motion and neck supple. No tenderness.  Lymphadenopathy:     Cervical: No cervical adenopathy.  Skin:    General: Skin is warm and dry.     Capillary Refill: Capillary refill takes less than 2 seconds.  Neurological:     General: No focal deficit present.     Mental Status: He is alert and oriented to person, place, and time.  Psychiatric:        Mood and Affect: Mood normal.        Behavior: Behavior normal.      ASSESSMENT & PLAN: Johnny Boyer was seen today for annual exam and medication refill.  Diagnoses and all orders for this visit:  Routine general medical examination at a health care facility  Dyslipidemia -     atorvastatin (LIPITOR) 80 MG tablet; Take 1 tablet (80 mg total) by mouth daily.  History of coronary artery disease  External hemorrhoids -     Ambulatory referral to Colorectal Surgery  Need for vaccination with 13-polyvalent pneumococcal conjugate vaccine -     Pneumococcal conjugate vaccine 13-valent IM    Patient Instructions       If you have lab work done  today you will be contacted with your lab results within the next 2 weeks.  If you have not heard from [place] then please contact us. The fastest way to get your results is to register for My Chart.   IF you received an x-ray today, you will receive an invoice from [facility]. Please contact [facility] at [phone] with questions or concerns regarding your invoice.   IF you received labwork today, you will receive an invoice from [facility]. Please contact [facility] at [phone] with questions or concerns regarding your invoice.   Our billing staff will not be  able to assist you with questions regarding bills from these companies.  You will be contacted with the lab results as soon as they are available. The fastest way to get your results is to activate your My Chart account. Instructions are located on the last page of this paperwork. If you have not heard from [place] regarding the results in 2 weeks, please contact this office.      Health Maintenance, Male Adopting a healthy lifestyle and getting preventive care are important in promoting health and wellness. Ask your health care provider about:  The right schedule for you to have regular tests and exams.  Things you can do on your own to prevent diseases and keep yourself healthy. What should I know about diet, weight, and exercise? Eat a healthy diet   Eat a diet that includes Boyer of vegetables, fruits, low-fat dairy products, and lean protein.  Do not eat a lot of foods that are high in solid fats, added sugars, or sodium. Maintain a healthy weight Body mass index (BMI) is a measurement that can be used to identify possible weight problems. It estimates body fat based on height and weight. Your health care provider can help determine your BMI and help you achieve or maintain a healthy weight. Get regular exercise Get regular exercise. This is one of the most important things you can do for your health. Most  adults should:  Exercise for at least 150 minutes each week. The exercise should increase your heart rate and make you sweat (moderate-intensity exercise).  Do strengthening exercises at least twice a week. This is in addition to the moderate-intensity exercise.  Spend less time sitting. Even light physical activity can be beneficial. Watch cholesterol and blood lipids Have your blood tested for lipids and cholesterol at 65 years of age, then have this test every 5 years. You may need to have your cholesterol levels checked more often if:  Your lipid or cholesterol levels are high.  You are older than 65 years of age.  You are at high risk for heart disease. What should I know about cancer screening? Many types of cancers can be detected early and may often be prevented. Depending on your health history and family history, you may need to have cancer screening at various ages. This may include screening for:  Colorectal cancer.  Prostate cancer.  Skin cancer.  Lung cancer. What should I know about heart disease, diabetes, and high blood pressure? Blood pressure and heart disease  High blood pressure causes heart disease and increases the risk of stroke. This is more likely to develop in people who have high blood pressure readings, are of African descent, or are overweight.  Talk with your health care provider about your target blood pressure readings.  Have your blood pressure checked: ? Every 3-5 years if you are 21-65 years of age. ? Every year if you are 49 years old or older.  If you are between the ages of 40 and 67 and are a current or former smoker, ask your health care provider if you should have a one-time screening for abdominal aortic aneurysm (AAA). Diabetes Have regular diabetes screenings. This checks your fasting blood sugar level. Have the screening done:  Once every three years after age 47 if you are at a normal weight and have a low risk for  diabetes.  More often and at a younger age if you are overweight or have a high risk for diabetes. What should I know  about preventing infection? Hepatitis B If you have a higher risk for hepatitis B, you should be screened for this virus. Talk with your health care provider to find out if you are at risk for hepatitis B infection. Hepatitis C Blood testing is recommended for:  Everyone born from [id number] through 1966.  Anyone with known risk factors for hepatitis C. Sexually transmitted infections (STIs)  You should be screened each year for STIs, including gonorrhea and chlamydia, if: ? You are sexually active and are younger than 65 years of age. ? You are older than 65 years of age and your health care provider tells you that you are at risk for this type of infection. ? Your sexual activity has changed since you were last screened, and you are at increased risk for chlamydia or gonorrhea. Ask your health care provider if you are at risk.  Ask your health care provider about whether you are at high risk for HIV. Your health care provider may recommend a prescription medicine to help prevent HIV infection. If you choose to take medicine to prevent HIV, you should first get tested for HIV. You should then be tested every 3 months for as long as you are taking the medicine. Follow these instructions at home: Lifestyle  Do not use any products that contain nicotine or tobacco, such as cigarettes, e-cigarettes, and chewing tobacco. If you need help quitting, ask your health care provider.  Do not use street drugs.  Do not share needles.  Ask your health care provider for help if you need support or information about quitting drugs. Alcohol use  Do not drink alcohol if your health care provider tells you not to drink.  If you drink alcohol: ? Limit how much you have to 0-2 drinks a day. ? Be aware of how much alcohol is in your drink. In the [place] one drink equals one 12 oz bottle of  beer (355 mL), one 5 oz glass of wine (148 mL), or one 1 oz glass of hard liquor (44 mL). General instructions  Schedule regular health, dental, and eye exams.  Stay current with your vaccines.  Tell your health care provider if: ? You often feel depressed. ? You have ever been abused or do not feel safe at home. Summary  Adopting a healthy lifestyle and getting preventive care are important in promoting health and wellness.  Follow your health care provider's instructions about healthy diet, exercising, and getting tested or screened for diseases.  Follow your health care provider's instructions on monitoring your cholesterol and blood pressure. This information is not intended to replace advice given to you by your health care provider. Make sure you discuss any questions you have with your health care provider. Document Revised: 03/26/2018 Document Reviewed: 03/26/2018 Elsevier Patient Education  2020 [employer]      Edwina BarthMiguel Nashon Erbes, MD [facility]

## 2019-12-15 NOTE — Patient Instructions (Addendum)
   If you have lab work done today you will be contacted with your lab results within the next 2 weeks.  If you have not heard from us then please contact us. The fastest way to get your results is to register for My Chart.   IF you received an x-ray today, you will receive an invoice from [facility]. Please contact [facility] at [phone] with questions or concerns regarding your invoice.   IF you received labwork today, you will receive an invoice from [facility]. Please contact [facility] at [phone] with questions or concerns regarding your invoice.   Our billing staff will not be able to assist you with questions regarding bills from these companies.  You will be contacted with the lab results as soon as they are available. The fastest way to get your results is to activate your My Chart account. Instructions are located on the last page of this paperwork. If you have not heard from us regarding the results in 2 weeks, please contact this office.      Health Maintenance, Male Adopting a healthy lifestyle and getting preventive care are important in promoting health and wellness. Ask your health care provider about:  The right schedule for you to have regular tests and exams.  Things you can do on your own to prevent diseases and keep yourself healthy. What should I know about diet, weight, and exercise? Eat a healthy diet   Eat a diet that includes plenty of vegetables, fruits, low-fat dairy products, and lean protein.  Do not eat a lot of foods that are high in solid fats, added sugars, or sodium. Maintain a healthy weight Body mass index (BMI) is a measurement that can be used to identify possible weight problems. It estimates body fat based on height and weight. Your health care provider can help determine your BMI and help you achieve or maintain a healthy weight. Get regular exercise Get regular exercise. This is one of the most important things you  can do for your health. Most adults should:  Exercise for at least 150 minutes each week. The exercise should increase your heart rate and make you sweat (moderate-intensity exercise).  Do strengthening exercises at least twice a week. This is in addition to the moderate-intensity exercise.  Spend less time sitting. Even light physical activity can be beneficial. Watch cholesterol and blood lipids Have your blood tested for lipids and cholesterol at 65 years of age, then have this test every 5 years. You may need to have your cholesterol levels checked more often if:  Your lipid or cholesterol levels are high.  You are older than 65 years of age.  You are at high risk for heart disease. What should I know about cancer screening? Many types of cancers can be detected early and may often be prevented. Depending on your health history and family history, you may need to have cancer screening at various ages. This may include screening for:  Colorectal cancer.  Prostate cancer.  Skin cancer.  Lung cancer. What should I know about heart disease, diabetes, and high blood pressure? Blood pressure and heart disease  High blood pressure causes heart disease and increases the risk of stroke. This is more likely to develop in people who have high blood pressure readings, are of African descent, or are overweight.  Talk with your health care provider about your target blood pressure readings.  Have your blood pressure checked: ? Every 3-5 years if you are 18-39   years of age. ? Every year if you are 40 years old or older.  If you are between the ages of 65 and 75 and are a current or former smoker, ask your health care provider if you should have a one-time screening for abdominal aortic aneurysm (AAA). Diabetes Have regular diabetes screenings. This checks your fasting blood sugar level. Have the screening done:  Once every three years after age 45 if you are at a normal weight and have  a low risk for diabetes.  More often and at a younger age if you are overweight or have a high risk for diabetes. What should I know about preventing infection? Hepatitis B If you have a higher risk for hepatitis B, you should be screened for this virus. Talk with your health care provider to find out if you are at risk for hepatitis B infection. Hepatitis C Blood testing is recommended for:  Everyone born from 1945 through 1965.  Anyone with known risk factors for hepatitis C. Sexually transmitted infections (STIs)  You should be screened each year for STIs, including gonorrhea and chlamydia, if: ? You are sexually active and are younger than 65 years of age. ? You are older than 65 years of age and your health care provider tells you that you are at risk for this type of infection. ? Your sexual activity has changed since you were last screened, and you are at increased risk for chlamydia or gonorrhea. Ask your health care provider if you are at risk.  Ask your health care provider about whether you are at high risk for HIV. Your health care provider may recommend a prescription medicine to help prevent HIV infection. If you choose to take medicine to prevent HIV, you should first get tested for HIV. You should then be tested every 3 months for as long as you are taking the medicine. Follow these instructions at home: Lifestyle  Do not use any products that contain nicotine or tobacco, such as cigarettes, e-cigarettes, and chewing tobacco. If you need help quitting, ask your health care provider.  Do not use street drugs.  Do not share needles.  Ask your health care provider for help if you need support or information about quitting drugs. Alcohol use  Do not drink alcohol if your health care provider tells you not to drink.  If you drink alcohol: ? Limit how much you have to 0-2 drinks a day. ? Be aware of how much alcohol is in your drink. In the [place] one drink equals one 12  oz bottle of beer (355 mL), one 5 oz glass of wine (148 mL), or one 1 oz glass of hard liquor (44 mL). General instructions  Schedule regular health, dental, and eye exams.  Stay current with your vaccines.  Tell your health care provider if: ? You often feel depressed. ? You have ever been abused or do not feel safe at home. Summary  Adopting a healthy lifestyle and getting preventive care are important in promoting health and wellness.  Follow your health care provider's instructions about healthy diet, exercising, and getting tested or screened for diseases.  Follow your health care provider's instructions on monitoring your cholesterol and blood pressure. This information is not intended to replace advice given to you by your health care provider. Make sure you discuss any questions you have with your health care provider. Document Revised: 03/26/2018 Document Reviewed: 03/26/2018 Elsevier Patient Education  2020 Elsevier Inc.  

## 2019-12-24 NOTE — Addendum Note (Signed)
Addended by: Evie Lacks on: 12/24/2019 06:08 PM   Modules accepted: Level of Service

## 2020-02-15 ENCOUNTER — Ambulatory Visit: Payer: Self-pay | Admitting: Surgery

## 2020-02-15 NOTE — H&P (Signed)
Johnny Boyer Appointment: 02/15/2020 8:45 AM Location: [facility] Patient #: [id number] DOB: 08/24/1955 Married / Language: Johnny Boyer / Race: White Male  History of Present Illness Johnny Sportsman MD; 02/15/2020 1:00 PM) The patient is a 64 year old male who presents with hemorrhoids. Note for "Hemorrhoids": ` ` ` Patient sent for surgical consultation at the request of Johnny Boyer, Johnny Kempf, MD Chief Complaint: Worsening hemorrhoid ` ` The patient is a active relatively healthy male who has had hemorrhoids for most of his life. Usually the been manageable but in the last year they have gotten worse. Painful irritated. Bleeding with bowel movements. Originally from [place]. [place]ocated [place]. Recalls having colonoscopy about 5 years ago that showed a few small polyps. Pull-up those records. Usually moves his bowels every day. 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 Since that he is adjusted diet. He runs about 4 miles a day. No other cardiac events. On low-dose aspirin. He was diagnosed with sleep apnea. Not able to tolerate CPAP machine surgery does not use it. Sounds like he's intentionally lost some weight. No anorectal interventions. No prior abdominal surgeries. No tobacco or diabetes.  No personal nor family history of GI/colon cancer, inflammatory bowel disease, irritable bowel syndrome, allergy such as Celiac Sprue, dietary/dairy problems, colitis, ulcers nor gastritis. No recent sick contacts/gastroenteritis. No travel outside the country. No changes in diet. No dysphagia to solids or liquids. No significant heartburn or reflux. No melena, hematemesis, coffee ground emesis. No evidence of prior gastric/peptic ulceration.  (Review of systems as stated in this history (HPI) or in the review of systems. Otherwise all other 12 point ROS are  negative) ` ` ###########################################`  This patient encounter took 30 minutes today to perform the following: obtain history, perform exam, review outside records, interpret tests & imaging, counsel the patient on their diagnosis; and, document this encounter, including findings & plan in the electronic health record (EHR).   Past Surgical History (Chanel Lonni Fix, CMA; 02/15/2020 8:33 AM) Colon Polyp Removal - Colonoscopy Foot Surgery Right. Knee Surgery Left. Oral Surgery Shoulder Surgery Bilateral. Spinal Surgery - Neck  Diagnostic Studies History [facility] Lonni Fix, CMA; 02/15/2020 8:33 AM) Colonoscopy 5-10 years ago  Allergies Hedy Camara Lonni Fix, CMA; 02/15/2020 8:34 AM) No Known Drug Allergies [02/15/2020]: Allergies Reconciled  Medication History (Chanel Lonni Fix, CMA; 02/15/2020 8:34 AM) Atorvastatin Calcium (80MG  Tablet, Oral) Active. Aspirin (81MG  Tablet, Oral) Active. Medications Reconciled  Other Problems (Chanel , CMA; 02/15/2020 8:33 AM) Back Pain Hemorrhoids Hypercholesterolemia Myocardial infarction Sleep Apnea     Review of Systems ([name] CMA; 02/15/2020 8:33 AM) General Not Present- Appetite Loss, Chills, Fatigue, Fever, Night Sweats, Weight Gain and Weight Loss. Skin Not Present- Change in Wart/Mole, Dryness, Hives, Jaundice, New Lesions, Non-Healing Wounds, Rash and Ulcer. HEENT Present- Nose Bleed and Wears glasses/contact lenses. Not Present- Earache, Hearing Loss, Hoarseness, Oral Ulcers, Ringing in the Ears, Seasonal Allergies, Sinus Pain, Sore Throat, Visual Disturbances and Yellow Eyes. Respiratory Not Present- Bloody sputum, Chronic Cough, Difficulty Breathing, Snoring and Wheezing. Breast Not Present- Breast Mass, Breast Pain, Nipple Discharge and Skin Changes. Cardiovascular Not Present- Chest Pain, Difficulty Breathing Lying Down, Leg Cramps, Palpitations, Rapid Heart Rate, Shortness of Breath and Swelling of  Extremities. Gastrointestinal Present- Hemorrhoids and Rectal Pain. Not Present- Abdominal Pain, Bloating, Bloody Stool, Change in Bowel Habits, Chronic diarrhea, Constipation, Difficulty Swallowing, Excessive gas, Gets full quickly at meals, Indigestion, Nausea and Vomiting. Male Genitourinary  Present- Impotence. Not Present- Blood in Urine, Change in Urinary Stream, Frequency, Nocturia, Painful Urination, Urgency and Urine Leakage. Musculoskeletal Present- Back Pain. Not Present- Joint Pain, Joint Stiffness, Muscle Pain, Muscle Weakness and Swelling of Extremities. Neurological Not Present- Decreased Memory, Fainting, Headaches, Numbness, Seizures, Tingling, Tremor, Trouble walking and Weakness. Psychiatric Not Present- Anxiety, Bipolar, Change in Sleep Pattern, Depression, Fearful and Frequent crying. Endocrine Not Present- Cold Intolerance, Excessive Hunger, Hair Changes, Heat Intolerance, Hot flashes and New Diabetes. Hematology Not Present- Blood Thinners, Easy Bruising, Excessive bleeding, Gland problems, HIV and Persistent Infections.  Vitals ([name] CMA; 02/15/2020 8:34 AM) 02/15/2020 8:34 AM Weight: 196.38 lb Height: 69in Body Surface Area: 2.05 m Body Mass Index: 29 kg/m  Temp.: 96.53F  Pulse: 62 (Regular)  BP: 128/80(Sitting, Left Arm, Standard)        Physical Exam Johnny Sportsman MD; 02/15/2020 9:26 AM)  General Mental Status-Alert. General Appearance-Not in acute distress, Not Sickly. Orientation-Oriented X3. Hydration-Well hydrated. Voice-Normal.  Integumentary Global Assessment Upon inspection and palpation of skin surfaces of the - Axillae: non-tender, no inflammation or ulceration, no drainage. and Distribution of scalp and body hair is normal. General Characteristics Temperature - normal warmth is noted.  Head and Neck Head-normocephalic, atraumatic with no lesions or palpable masses. Face Global Assessment - atraumatic, no  absence of expression. Neck Global Assessment - no abnormal movements, no bruit auscultated on the right, no bruit auscultated on the left, no decreased range of motion, non-tender. Trachea-midline. Thyroid Gland Characteristics - non-tender.  Eye Eyeball - Left-Extraocular movements intact, No Nystagmus - Left. Eyeball - Right-Extraocular movements intact, No Nystagmus - Right. Cornea - Left-No Hazy - Left. Cornea - Right-No Hazy - Right. Sclera/Conjunctiva - Left-No scleral icterus, No Discharge - Left. Sclera/Conjunctiva - Right-No scleral icterus, No Discharge - Right. Pupil - Left-Direct reaction to light normal. Pupil - Right-Direct reaction to light normal.  ENMT Ears Pinna - Left - no drainage observed, no generalized tenderness observed. Pinna - Right - no drainage observed, no generalized tenderness observed. Nose and Sinuses External Inspection of the Nose - no destructive lesion observed. Inspection of the nares - Left - quiet respiration. Inspection of the nares - Right - quiet respiration. Mouth and Throat Lips - Upper Lip - no fissures observed, no pallor noted. Lower Lip - no fissures observed, no pallor noted. Nasopharynx - no discharge present. Oral Cavity/Oropharynx - Tongue - no dryness observed. Oral Mucosa - no cyanosis observed. Hypopharynx - no evidence of airway distress observed.  Chest and Lung Exam Inspection Movements - Normal and Symmetrical. Accessory muscles - No use of accessory muscles in breathing. Palpation Palpation of the chest reveals - Non-tender. Auscultation Breath sounds - Normal and Clear.  Cardiovascular Auscultation Rhythm - Regular. Murmurs & Other Heart Sounds - Auscultation of the heart reveals - No Murmurs and No Systolic Clicks.  Abdomen Inspection Inspection of the abdomen reveals - No Visible peristalsis and No Abnormal pulsations. Umbilicus - No Bleeding, No Urine drainage. Palpation/Percussion Palpation  and Percussion of the abdomen reveal - Soft, Non Tender, No Rebound tenderness, No Rigidity (guarding) and No Cutaneous hyperesthesia. Note: Abdomen soft. Nontender. Not distended. No umbilical or incisional hernias. No guarding.  Male Genitourinary Sexual Maturity Tanner 5 - Adult hair pattern and Adult penile size and shape. Note: No inguinal hernias. Normal external genitalia. Epididymi, testes, and spermatic cords normal without any masses.  Rectal Note: Perianal skin clear. Large grade 4 right anterior hemorrhoid with external/internal component. Numerous external hemorrhoidal  tags as well especially right posterior and left lateral.  No fissure or fistula or abscess. Irritated internal hemorrhoids as well. Prostate smooth and not large fatty masses. No proctitis. No condyloma. Skin nodule in upper left inner gluteal cleft but not consistent with pilonidal disease. More like flat acrochordon. A few others on his lower back as well.  Peripheral Vascular Upper Extremity Inspection - Left - No Cyanotic nailbeds - Left, Not Ischemic. Inspection - Right - No Cyanotic nailbeds - Right, Not Ischemic.  Neurologic Neurologic evaluation reveals -normal attention span and ability to concentrate, able to name objects and repeat phrases. Appropriate fund of knowledge , normal sensation and normal coordination. Mental Status Affect - not angry, not paranoid. Cranial Nerves-Normal Bilaterally. Gait-Normal.  Neuropsychiatric Mental status exam performed with findings of-able to articulate well with normal speech/language, rate, volume and coherence, thought content normal with ability to perform basic computations and apply abstract reasoning and no evidence of hallucinations, delusions, obsessions or homicidal/suicidal ideation.  Musculoskeletal Global Assessment Spine, Ribs and Pelvis - no instability, subluxation or laxity. Right Upper Extremity - no instability, subluxation or  laxity.  Lymphatic Head & Neck  General Head & Neck Lymphatics: Bilateral - Description - No Localized lymphadenopathy. Axillary  General Axillary Region: Bilateral - Description - No Localized lymphadenopathy. Femoral & Inguinal  Generalized Femoral & Inguinal Lymphatics: Left - Description - No Localized lymphadenopathy. Right - Description - No Localized lymphadenopathy.   Results Johnny Boyer(Kinley Ferrentino C. Bralynn Velador MD; 02/15/2020 1:01 PM) Procedures  Name Value Date Hemorrhoids Procedure Other: Perianal skin clear. Large grade 4 right anterior hemorrhoid with external/internal component. Numerous external hemorrhoidal tags as well especially right posterior and left lateral............Marland Kitchen.No fissure or fistula or abscess. Irritated internal hemorrhoids as well. Prostate smooth and not large fatty masses. No proctitis. No condyloma. Skin nodule in upper left inner gluteal cleft but not consistent with pilonidal disease. More like flat acrochordon. A few others on his lower back as well.  Performed: 02/15/2020 9:27 AM    Assessment & Plan Johnny Boyer(Jeffrie Stander C. Kyri Dai MD; 02/15/2020 1:00 PM)  EXTERNAL HEMORRHOIDS WITH COMPLICATION (K64.4) Impression: Enlarged hemorrhoids with especially external component. Right anterior grade 4.  I think he would benefit from hemorrhoid surgery. Ligation pexy with embolectomy of draining tissue. He is interested in proceeding.  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 I cautioned against him going back to work right-of-way really working for home for the first few weeks. Gradually increased physical activity to unrestricted activity by 6 weeks. Questions answered. He is interested in proceeding. We will work to coordinate a convenient time  Current Plans Pt Education - [facility] Hemorrhoids (Dahna Hattabaugh): discussed with patient and provided information. Pt Education - Pamphlet Given - The Hemorrhoid Book: discussed with patient and provided  information. You are being scheduled for surgery- Our schedulers will call you.  You should hear from our office's scheduling department within 5 working days about the location, date, and time of surgery. We try to make accommodations for patient's preferences in scheduling surgery, but sometimes the OR schedule or the surgeon's schedule prevents us from making those accommodations.  If you have not heard from our office [phone]) in 5 working days, call the office and ask for your surgeon's nurse.  If you have other questions about your diagnosis, plan, or surgery, call the office and ask for your surgeon's nurse.  The anatomy & physiology of the anorectal region was discussed. The pathophysiology of hemorrhoids and differential diagnosis was discussed. Natural  history risks without surgery was discussed. I stressed the importance of a bowel regimen to have daily soft bowel movements to minimize progression of disease. Interventions such as sclerotherapy & banding were discussed.  The patient's symptoms are not adequately controlled by medicines and other non-operative treatments. I feel the risks & problems of no surgery outweigh the operative risks; therefore, I recommended surgery to treat the hemorrhoids by ligation, pexy, and possible resection.  Risks such as bleeding, infection, urinary difficulties, need for further treatment, heart attack, death, and other risks were discussed. I noted a good likelihood this will help address the problem. Goals of post-operative recovery were discussed as well. Possibility that this will not correct all symptoms was explained. Post-operative pain, bleeding, constipation, and other problems after surgery were discussed. We will work to minimize complications. Educational handouts further explaining the pathology, treatment options, and bowel regimen were given as well. Questions were answered. The patient expresses understanding &  wishes to proceed with surgery.   PROLAPSED INTERNAL HEMORRHOIDS, GRADE 4 (K64.3) Impression: Worsening hemorrhoids. I think he would benefit from surgical removal. He agrees.  Current Plans ANOSCOPY, DIAGNOSTIC (38182)  ENCOUNTER FOR PREOPERATIVE EXAMINATION FOR GENERAL SURGICAL PROCEDURE (Z01.818)  Current Plans You are being scheduled for surgery- Our schedulers will call you.  You should hear from our office's scheduling department within 5 working days about the location, date, and time of surgery. We try to make accommodations for patient's preferences in scheduling surgery, but sometimes the OR schedule or the surgeon's schedule prevents [place] from making those accommodations.  If you have not heard from our office [phone]) in 5 working days, call the office and ask for your surgeon's nurse.  If you have other questions about your diagnosis, plan, or surgery, call the office and ask for your surgeon's nurse.  Pt Education - [facility] Rectal Prep for Anorectal outpatient/office surgery: discussed with patient and provided information. Pt Education - [facility] Rectal Surgery HCI (Correen Bubolz): discussed with patient and provided information.  PROLAPSED INTERNAL HEMORRHOIDS, GRADE 2 (K64.1)   OSA (OBSTRUCTIVE SLEEP APNEA) (G47.33) Impression: He does have sleep apnea but cannot tolerate CPAP. In good chape   PRESENCE OF STENT IN CORONARY ARTERY IN PATIENT WITH CORONARY ARTERY DISEASE (I25.10) Impression: History of coronary disease with stenting in 2009 after myocardial infarction. He is just on aspirin only. He runs 4 miles a day with excellent exercise tolerance.  I do not feel strongly needs cardiac clearance for a low risk surgery. Did not seem to be any concerns from his primary care physician either.  Johnny Sportsman, MD, FACS, MASCRS Gastrointestinal and Minimally Invasive Surgery  [facility] [address] [place], [place] [address] [phone]  Fax [phone] Main/Paging  CONTACT INFORMATION: Weekday (9AM-5PM) concerns: Call [facility] main office at [phone] Weeknight (5PM-9AM) or Weekend/Holiday concerns: Check [email or web address] for General Surgery [facility] coverage (Please, do not use SecureChat as it is not reliable communication to operating surgeons for immediate patient care)

## 2020-04-14 ENCOUNTER — Other Ambulatory Visit: Payer: Self-pay

## 2020-04-14 ENCOUNTER — Encounter (HOSPITAL_BASED_OUTPATIENT_CLINIC_OR_DEPARTMENT_OTHER): Payer: Self-pay | Admitting: Surgery

## 2020-04-14 NOTE — Progress Notes (Signed)
Spoke w/ via phone for pre-op interview---pt Lab needs dos----  I stat 8             Lab results------ekg 10-30-2020  no current cardiologist primary md dr Irving Shows sagardia handles all patient needs, echo 06-03-2013 care everywhere COVID test ------04-19-2019 805 Arrive at -------900 am 04-21-2020 NPO after MN NO Solid Food.  Clear liquids from MN until---800 am then npo Medications to take morning of surgery -----atorvastatin Diabetic medication -----n/a Patient Special Instructions -----patient staying on 81 mg aspirin will take last dose day before surgery Pre-Op special Istructions -----none Patient verbalized understanding of instructions that were given at this phone interview. Patient denies shortness of breath, chest pain, fever, cough at this phone interview.

## 2020-04-18 ENCOUNTER — Other Ambulatory Visit (HOSPITAL_COMMUNITY)
Admission: RE | Admit: 2020-04-18 | Discharge: 2020-04-18 | Disposition: A | Discharge status: Home / Self Care | Payer: Medicare Other | Source: Ambulatory Visit | Attending: Surgery | Admitting: Surgery

## 2020-04-18 DIAGNOSIS — Z20822 Contact with and (suspected) exposure to covid-19: Secondary | ICD-10-CM | POA: Diagnosis not present

## 2020-04-18 DIAGNOSIS — Z01812 Encounter for preprocedural laboratory examination: Secondary | ICD-10-CM | POA: Insufficient documentation

## 2020-04-18 LAB — SARS CORONAVIRUS 2 (TAT 6-24 HRS): SARS Coronavirus 2: NEGATIVE

## 2020-04-20 MED ORDER — BUPIVACAINE LIPOSOME 1.3 % IJ SUSP: 20.0000 mL | Freq: Once | INTRAMUSCULAR | Status: DC

## 2020-04-20 NOTE — Anesthesia Preprocedure Evaluation (Signed)
Anesthesia Evaluation  Patient identified by MRN, date of birth, ID band Patient awake    Reviewed: Allergy & Precautions, NPO status , Patient's Chart, lab work & pertinent test results  Airway Mallampati: II  TM Distance: >3 FB Neck ROM: Full    Dental no notable dental hx. (+) Teeth Intact, Dental Advisory Given   Pulmonary sleep apnea ,    Pulmonary exam normal breath sounds clear to auscultation       Cardiovascular Exercise Tolerance: Good + Past MI (in 2009)  Normal cardiovascular exam Rhythm:Regular Rate:Normal     Neuro/Psych negative neurological ROS  negative psych ROS   GI/Hepatic negative GI ROS, Neg liver ROS,   Endo/Other  negative endocrine ROS  Renal/GU negative Renal ROS     Musculoskeletal negative musculoskeletal ROS (+)   Abdominal   Peds  Hematology Lab Results      Component                Value               Date                      WBC                      13.2 (H)            10/31/2019                HGB                      13.8                10/31/2019                HCT                      39.5                10/31/2019                MCV                      89.4                10/31/2019                PLT                      170                 10/31/2019              Anesthesia Other Findings   Reproductive/Obstetrics                            Anesthesia Physical Anesthesia Plan  ASA: II  Anesthesia Plan: General   Post-op Pain Management:    Induction: Intravenous  PONV Risk Score and Plan: 2 and Treatment may vary due to age or medical condition, Ondansetron, Dexamethasone and Midazolam  Airway Management Planned: Oral ETT  Additional Equipment: None  Intra-op Plan:   Post-operative Plan: Extubation in OR  Informed Consent: I have reviewed the patients History and Physical, chart, labs and discussed the procedure including the risks,  benefits and alternatives for the proposed anesthesia with the patient or authorized  representative who has indicated his/her understanding and acceptance.     Dental advisory given  Plan Discussed with: CRNA and Anesthesiologist  Anesthesia Plan Comments:        Anesthesia Quick Evaluation

## 2020-04-21 ENCOUNTER — Other Ambulatory Visit: Payer: Self-pay

## 2020-04-21 ENCOUNTER — Ambulatory Visit (HOSPITAL_BASED_OUTPATIENT_CLINIC_OR_DEPARTMENT_OTHER): Payer: Medicare Other | Admitting: Anesthesiology

## 2020-04-21 ENCOUNTER — Encounter (HOSPITAL_BASED_OUTPATIENT_CLINIC_OR_DEPARTMENT_OTHER)
Admission: RE | Disposition: A | Discharge status: Home / Self Care | Payer: Self-pay | Source: Home / Self Care | Attending: Surgery

## 2020-04-21 ENCOUNTER — Ambulatory Visit (HOSPITAL_BASED_OUTPATIENT_CLINIC_OR_DEPARTMENT_OTHER)
Admission: RE | Admit: 2020-04-21 | Discharge: 2020-04-21 | Disposition: A | Discharge status: Home / Self Care | Payer: Medicare Other | Attending: Surgery | Admitting: Surgery

## 2020-04-21 ENCOUNTER — Encounter (HOSPITAL_BASED_OUTPATIENT_CLINIC_OR_DEPARTMENT_OTHER): Payer: Self-pay | Admitting: Surgery

## 2020-04-21 DIAGNOSIS — I252 Old myocardial infarction: Secondary | ICD-10-CM | POA: Insufficient documentation

## 2020-04-21 DIAGNOSIS — Z7982 Long term (current) use of aspirin: Secondary | ICD-10-CM | POA: Insufficient documentation

## 2020-04-21 DIAGNOSIS — G4733 Obstructive sleep apnea (adult) (pediatric): Secondary | ICD-10-CM | POA: Insufficient documentation

## 2020-04-21 DIAGNOSIS — Z955 Presence of coronary angioplasty implant and graft: Secondary | ICD-10-CM | POA: Insufficient documentation

## 2020-04-21 DIAGNOSIS — K642 Third degree hemorrhoids: Secondary | ICD-10-CM | POA: Diagnosis not present

## 2020-04-21 DIAGNOSIS — K643 Fourth degree hemorrhoids: Secondary | ICD-10-CM | POA: Insufficient documentation

## 2020-04-21 DIAGNOSIS — K644 Residual hemorrhoidal skin tags: Secondary | ICD-10-CM | POA: Insufficient documentation

## 2020-04-21 HISTORY — DX: Unspecified hemorrhoids: K64.9

## 2020-04-21 HISTORY — DX: Sleep apnea, unspecified: G47.30

## 2020-04-21 HISTORY — PX: EVALUATION UNDER ANESTHESIA WITH HEMORRHOIDECTOMY: SHX5624

## 2020-04-21 LAB — POCT I-STAT, CHEM 8
BUN: 17 mg/dL (ref 8–23)
Calcium, Ion: 1.27 mmol/L (ref 1.15–1.40)
Chloride: 102 mmol/L (ref 98–111)
Creatinine, Ser: 0.9 mg/dL (ref 0.61–1.24)
Glucose, Bld: 94 mg/dL (ref 70–99)
HCT: 44 % (ref 39.0–52.0)
Hemoglobin: 15 g/dL (ref 13.0–17.0)
Potassium: 4.4 mmol/L (ref 3.5–5.1)
Sodium: 140 mmol/L (ref 135–145)
TCO2: 27 mmol/L (ref 22–32)

## 2020-04-21 SURGERY — EXAM UNDER ANESTHESIA WITH HEMORRHOIDECTOMY
Anesthesia: General | Site: Rectum

## 2020-04-21 MED ORDER — KETOROLAC TROMETHAMINE 30 MG/ML IJ SOLN
INTRAMUSCULAR | Status: DC | PRN
  Administered 2020-04-21: 30 mg via INTRAVENOUS

## 2020-04-21 MED ORDER — FENTANYL CITRATE (PF) 100 MCG/2ML IJ SOLN
INTRAMUSCULAR | Status: AC
  Filled 2020-04-21: qty 2

## 2020-04-21 MED ORDER — BUPIVACAINE-EPINEPHRINE 0.25% -1:200000 IJ SOLN
INTRAMUSCULAR | Status: DC | PRN
  Administered 2020-04-21: 20 mL

## 2020-04-21 MED ORDER — KETOROLAC TROMETHAMINE 15 MG/ML IJ SOLN
INTRAMUSCULAR | Status: AC
  Filled 2020-04-21: qty 1

## 2020-04-21 MED ORDER — OXYCODONE HCL 5 MG PO TABS
ORAL_TABLET | ORAL | Status: AC
  Filled 2020-04-21: qty 1

## 2020-04-21 MED ORDER — ROCURONIUM BROMIDE 10 MG/ML (PF) SYRINGE
PREFILLED_SYRINGE | INTRAVENOUS | Status: AC
  Filled 2020-04-21: qty 10

## 2020-04-21 MED ORDER — KETOROLAC TROMETHAMINE 30 MG/ML IJ SOLN
15.0000 mg | Freq: Once | INTRAMUSCULAR | Status: AC | PRN
  Administered 2020-04-21: 15 mg via INTRAVENOUS

## 2020-04-21 MED ORDER — CEFTRIAXONE SODIUM 2 G IJ SOLR
INTRAMUSCULAR | Status: AC
  Filled 2020-04-21: qty 20

## 2020-04-21 MED ORDER — HYDROMORPHONE HCL 1 MG/ML IJ SOLN: 0.2500 mg | INTRAMUSCULAR | Status: DC | PRN

## 2020-04-21 MED ORDER — LIDOCAINE HCL (PF) 2 % IJ SOLN
INTRAMUSCULAR | Status: AC
  Filled 2020-04-21: qty 5

## 2020-04-21 MED ORDER — CELECOXIB 200 MG PO CAPS
ORAL_CAPSULE | ORAL | Status: AC
  Filled 2020-04-21: qty 1

## 2020-04-21 MED ORDER — SODIUM CHLORIDE 0.9 % IV SOLN
INTRAVENOUS | Status: AC
  Filled 2020-04-21: qty 100

## 2020-04-21 MED ORDER — SODIUM CHLORIDE 0.9 % IV SOLN: INTRAVENOUS | Status: DC

## 2020-04-21 MED ORDER — CELECOXIB 200 MG PO CAPS
200.0000 mg | ORAL_CAPSULE | ORAL | Status: AC
  Administered 2020-04-21: 200 mg via ORAL

## 2020-04-21 MED ORDER — ONDANSETRON HCL 4 MG/2ML IJ SOLN: 4.0000 mg | Freq: Once | INTRAMUSCULAR | Status: DC | PRN

## 2020-04-21 MED ORDER — ACETAMINOPHEN 500 MG PO TABS
1000.0000 mg | ORAL_TABLET | ORAL | Status: AC
  Administered 2020-04-21: 1000 mg via ORAL

## 2020-04-21 MED ORDER — ONDANSETRON HCL 4 MG/2ML IJ SOLN
INTRAMUSCULAR | Status: AC
  Filled 2020-04-21: qty 2

## 2020-04-21 MED ORDER — MIDAZOLAM HCL 2 MG/2ML IJ SOLN
INTRAMUSCULAR | Status: AC
  Filled 2020-04-21: qty 2

## 2020-04-21 MED ORDER — DIAZEPAM 5 MG PO TABS
ORAL_TABLET | ORAL | Status: AC
  Filled 2020-04-21: qty 1

## 2020-04-21 MED ORDER — LIDOCAINE 2% (20 MG/ML) 5 ML SYRINGE
INTRAMUSCULAR | Status: DC | PRN
  Administered 2020-04-21: 100 mg via INTRAVENOUS

## 2020-04-21 MED ORDER — PROPOFOL 10 MG/ML IV BOLUS
INTRAVENOUS | Status: AC
  Filled 2020-04-21: qty 20

## 2020-04-21 MED ORDER — SUGAMMADEX SODIUM 200 MG/2ML IV SOLN
INTRAVENOUS | Status: DC | PRN
  Administered 2020-04-21: 200 mg via INTRAVENOUS

## 2020-04-21 MED ORDER — SODIUM CHLORIDE 0.9 % IV SOLN
2.0000 g | INTRAVENOUS | Status: AC
  Administered 2020-04-21: 2 g via INTRAVENOUS

## 2020-04-21 MED ORDER — GLYCOPYRROLATE 0.2 MG/ML IJ SOLN
INTRAMUSCULAR | Status: DC | PRN
  Administered 2020-04-21: .2 mg via INTRAVENOUS

## 2020-04-21 MED ORDER — KETOROLAC TROMETHAMINE 30 MG/ML IJ SOLN
INTRAMUSCULAR | Status: AC
  Filled 2020-04-21: qty 1

## 2020-04-21 MED ORDER — GABAPENTIN 300 MG PO CAPS
300.0000 mg | ORAL_CAPSULE | ORAL | Status: AC
  Administered 2020-04-21: 300 mg via ORAL

## 2020-04-21 MED ORDER — CHLORHEXIDINE GLUCONATE CLOTH 2 % EX PADS: 6.0000 | MEDICATED_PAD | Freq: Once | CUTANEOUS | Status: DC

## 2020-04-21 MED ORDER — GABAPENTIN 300 MG PO CAPS
ORAL_CAPSULE | ORAL | Status: AC
  Filled 2020-04-21: qty 1

## 2020-04-21 MED ORDER — ACETAMINOPHEN 500 MG PO TABS
ORAL_TABLET | ORAL | Status: AC
  Filled 2020-04-21: qty 2

## 2020-04-21 MED ORDER — BUPIVACAINE LIPOSOME 1.3 % IJ SUSP
INTRAMUSCULAR | Status: DC | PRN
  Administered 2020-04-21: 20 mL

## 2020-04-21 MED ORDER — DIAZEPAM 5 MG PO TABS: 5.0000 mg | ORAL_TABLET | Freq: Four times a day (QID) | ORAL | 2 refills | Status: DC | PRN

## 2020-04-21 MED ORDER — EPHEDRINE SULFATE 50 MG/ML IJ SOLN
INTRAMUSCULAR | Status: DC | PRN
  Administered 2020-04-21 (×3): 10 mg via INTRAVENOUS

## 2020-04-21 MED ORDER — DIAZEPAM 5 MG PO TABS
5.0000 mg | ORAL_TABLET | Freq: Once | ORAL | Status: AC
  Administered 2020-04-21: 5 mg via ORAL

## 2020-04-21 MED ORDER — DIBUCAINE (PERIANAL) 1 % EX OINT
TOPICAL_OINTMENT | CUTANEOUS | Status: DC | PRN
  Administered 2020-04-21: 1 via RECTAL

## 2020-04-21 MED ORDER — GLYCOPYRROLATE PF 0.2 MG/ML IJ SOSY
PREFILLED_SYRINGE | INTRAMUSCULAR | Status: AC
  Filled 2020-04-21: qty 1

## 2020-04-21 MED ORDER — PROPOFOL 10 MG/ML IV BOLUS
INTRAVENOUS | Status: DC | PRN
  Administered 2020-04-21: 150 mg via INTRAVENOUS

## 2020-04-21 MED ORDER — ENSURE PRE-SURGERY PO LIQD: 296.0000 mL | Freq: Once | ORAL | Status: DC

## 2020-04-21 MED ORDER — METRONIDAZOLE IN NACL 5-0.79 MG/ML-% IV SOLN
500.0000 mg | INTRAVENOUS | Status: AC
  Administered 2020-04-21: 500 mg via INTRAVENOUS

## 2020-04-21 MED ORDER — OXYCODONE HCL 5 MG/5ML PO SOLN
5.0000 mg | Freq: Once | ORAL | Status: AC | PRN
Start: 2020-04-21 — End: 2020-04-21

## 2020-04-21 MED ORDER — FENTANYL CITRATE (PF) 100 MCG/2ML IJ SOLN
INTRAMUSCULAR | Status: DC | PRN
  Administered 2020-04-21: 100 ug via INTRAVENOUS
  Administered 2020-04-21 (×2): 50 ug via INTRAVENOUS

## 2020-04-21 MED ORDER — METRONIDAZOLE IN NACL 5-0.79 MG/ML-% IV SOLN
INTRAVENOUS | Status: AC
  Filled 2020-04-21: qty 100

## 2020-04-21 MED ORDER — MIDAZOLAM HCL 2 MG/2ML IJ SOLN
INTRAMUSCULAR | Status: DC | PRN
  Administered 2020-04-21: 2 mg via INTRAVENOUS

## 2020-04-21 MED ORDER — EPHEDRINE 5 MG/ML INJ
INTRAVENOUS | Status: AC
  Filled 2020-04-21: qty 10

## 2020-04-21 MED ORDER — ROCURONIUM BROMIDE 10 MG/ML (PF) SYRINGE
PREFILLED_SYRINGE | INTRAVENOUS | Status: DC | PRN
  Administered 2020-04-21: 70 mg via INTRAVENOUS

## 2020-04-21 MED ORDER — OXYCODONE HCL 5 MG PO TABS: 5.0000 mg | ORAL_TABLET | Freq: Four times a day (QID) | ORAL | 0 refills | Status: DC | PRN

## 2020-04-21 MED ORDER — ONDANSETRON HCL 4 MG/2ML IJ SOLN
INTRAMUSCULAR | Status: DC | PRN
  Administered 2020-04-21: 4 mg via INTRAVENOUS

## 2020-04-21 MED ORDER — DEXAMETHASONE SODIUM PHOSPHATE 10 MG/ML IJ SOLN
INTRAMUSCULAR | Status: AC
  Filled 2020-04-21: qty 1

## 2020-04-21 MED ORDER — OXYCODONE HCL 5 MG PO TABS
5.0000 mg | ORAL_TABLET | Freq: Once | ORAL | Status: AC | PRN
  Administered 2020-04-21: 5 mg via ORAL

## 2020-04-21 MED ORDER — PROPOFOL 10 MG/ML IV BOLUS
INTRAVENOUS | Status: AC
  Filled 2020-04-21: qty 40

## 2020-04-21 MED ORDER — DEXAMETHASONE SODIUM PHOSPHATE 10 MG/ML IJ SOLN
INTRAMUSCULAR | Status: DC | PRN
  Administered 2020-04-21: 10 mg via INTRAVENOUS

## 2020-04-21 SURGICAL SUPPLY — 59 items
APL SKNCLS STERI-STRIP NONHPOA (GAUZE/BANDAGES/DRESSINGS) ×1
BENZOIN TINCTURE PRP APPL 2/3 (GAUZE/BANDAGES/DRESSINGS) ×2 IMPLANT
BLADE HEX COATED 2.75 (ELECTRODE) ×2 IMPLANT
BLADE SURG 10 STRL SS (BLADE) IMPLANT
BLADE SURG 15 STRL LF DISP TIS (BLADE) ×1 IMPLANT
BLADE SURG 15 STRL SS (BLADE) ×2
BRIEF STRETCH FOR OB PAD LRG (UNDERPADS AND DIAPERS) ×2 IMPLANT
CANISTER SUCT 1200ML W/VALVE (MISCELLANEOUS) IMPLANT
COVER BACK TABLE 60X90IN (DRAPES) ×2 IMPLANT
COVER MAYO STAND STRL (DRAPES) ×2 IMPLANT
COVER WAND RF STERILE (DRAPES) ×2 IMPLANT
DECANTER SPIKE VIAL GLASS SM (MISCELLANEOUS) ×2 IMPLANT
DRAPE HYSTEROSCOPY (MISCELLANEOUS) IMPLANT
DRAPE LAPAROTOMY 100X72 PEDS (DRAPES) ×2 IMPLANT
DRAPE SHEET LG 3/4 BI-LAMINATE (DRAPES) IMPLANT
DRSG PAD ABDOMINAL 8X10 ST (GAUZE/BANDAGES/DRESSINGS) ×2 IMPLANT
ELECT NEEDLE TIP 2.8 STRL (NEEDLE) IMPLANT
ELECT REM PT RETURN 15FT ADLT (MISCELLANEOUS) ×2 IMPLANT
FILTER STRAW (MISCELLANEOUS) IMPLANT
GAUZE SPONGE 4X4 12PLY STRL LF (GAUZE/BANDAGES/DRESSINGS) ×2 IMPLANT
GLOVE BIO SURGEON STRL SZ7 (GLOVE) ×2 IMPLANT
GLOVE BIOGEL PI IND STRL 7.5 (GLOVE) ×1 IMPLANT
GLOVE BIOGEL PI INDICATOR 7.5 (GLOVE) ×1
GLOVE ECLIPSE 8.0 STRL XLNG CF (GLOVE) ×2 IMPLANT
GLOVE INDICATOR 8.0 STRL GRN (GLOVE) ×2 IMPLANT
GLOVE SURG SS PI 7.0 STRL IVOR (GLOVE) ×2 IMPLANT
GOWN STRL REUS W/TWL LRG LVL3 (GOWN DISPOSABLE) ×2 IMPLANT
GOWN STRL REUS W/TWL XL LVL3 (GOWN DISPOSABLE) ×2 IMPLANT
IV CATH PLACEMENT 20 GA (IV SOLUTION) ×2 IMPLANT
KIT SIGMOIDOSCOPE (SET/KITS/TRAYS/PACK) IMPLANT
KIT TURNOVER CYSTO (KITS) ×2 IMPLANT
LEGGING LITHOTOMY PAIR STRL (DRAPES) IMPLANT
MANIFOLD NEPTUNE II (INSTRUMENTS) ×2 IMPLANT
NEEDLE HYPO 22GX1.5 SAFETY (NEEDLE) ×2 IMPLANT
NS IRRIG 500ML POUR BTL (IV SOLUTION) ×2 IMPLANT
PACK BASIN DAY SURGERY FS (CUSTOM PROCEDURE TRAY) ×2 IMPLANT
PAD PREP 24X48 CUFFED NSTRL (MISCELLANEOUS) IMPLANT
PENCIL SMOKE EVACUATOR (MISCELLANEOUS) ×2 IMPLANT
SCRUB TECHNI CARE 4 OZ NO DYE (MISCELLANEOUS) ×2 IMPLANT
SHEARS HARMONIC 9CM CVD (BLADE) IMPLANT
SURGILUBE 2OZ TUBE FLIPTOP (MISCELLANEOUS) ×2 IMPLANT
SUT CHROMIC 2 0 SH (SUTURE) IMPLANT
SUT CHROMIC 3 0 SH 27 (SUTURE) ×4 IMPLANT
SUT VIC AB 2-0 SH 27 (SUTURE)
SUT VIC AB 2-0 SH 27XBRD (SUTURE) IMPLANT
SUT VIC AB 2-0 UR6 27 (SUTURE) ×14 IMPLANT
SUT VICRYL 0 UR6 27IN ABS (SUTURE) IMPLANT
SUT VICRYL AB 2 0 TIE (SUTURE) IMPLANT
SUT VICRYL AB 2 0 TIES (SUTURE)
SYR 20ML LL LF (SYRINGE) ×4 IMPLANT
SYR 27GX1/2 1ML LL SAFETY (SYRINGE) ×2 IMPLANT
SYR BULB IRRIG 60ML STRL (SYRINGE) ×2 IMPLANT
SYR CONTROL 10ML LL (SYRINGE) IMPLANT
TAPE CLOTH 3X10 TAN LF (GAUZE/BANDAGES/DRESSINGS) ×2 IMPLANT
TOWEL OR 17X26 10 PK STRL BLUE (TOWEL DISPOSABLE) ×2 IMPLANT
TRAY DSU PREP LF (CUSTOM PROCEDURE TRAY) ×2 IMPLANT
TUBE CONNECTING 12X1/4 (SUCTIONS) ×2 IMPLANT
UNDERPAD 30X36 HEAVY ABSORB (UNDERPADS AND DIAPERS) ×2 IMPLANT
YANKAUER SUCT BULB TIP NO VENT (SUCTIONS) ×2 IMPLANT

## 2020-04-21 NOTE — H&P (Signed)
Johnny Boyer Appointment: 02/15/2020 8:45 AM Location: [facility] Patient #: [id number] DOB: 1955/04/18 Married / Language: Johnny Boyer / Race: White Male  History of Present Illness Adin Hector MD; 02/15/2020 1:00 PM) The patient is a 65 year old male who presents with hemorrhoids. Note for "Hemorrhoids": ` ` ` Patient sent for surgical consultation at the request of Sagardia, [name], MD Chief Complaint: Worsening hemorrhoid ` ` The patient is a active relatively healthy male who has had hemorrhoids for most of his life. Usually the been manageable but in the last year they have gotten worse. Painful irritated. Bleeding with bowel movements. Originally from [place]. Relocated [place] few years ago. Recalls having colonoscopy about 5 years ago that showed a few small polyps. Pull-up those records. Usually moves his bowels every day. 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 Since that he is adjusted diet. He runs about 4 miles a day. No other cardiac events. On low-dose aspirin. He was diagnosed with sleep apnea. Not able to tolerate CPAP machine surgery does not use it. Sounds like he's intentionally lost some weight. No anorectal interventions. No prior abdominal surgeries. No tobacco or diabetes.  No personal nor family history of GI/colon cancer, inflammatory bowel disease, irritable bowel syndrome, allergy such as Celiac Sprue, dietary/dairy problems, colitis, ulcers nor gastritis. No recent sick contacts/gastroenteritis. No travel outside the country. No changes in diet. No dysphagia to solids or liquids. No significant heartburn or reflux. No melena, hematemesis, coffee ground emesis. No evidence of prior gastric/peptic ulceration.  (Review of systems as stated in this history (HPI) or in the review of systems. Otherwise all other 12 point ROS are  negative) ` ` ###########################################`  This patient encounter took 30 minutes today to perform the following: obtain history, perform exam, review outside records, interpret tests & imaging, counsel the patient on their diagnosis; and, document this encounter, including findings & plan in the electronic health record (EHR).   Past Surgical History (Johnny Boyer, [place]; 02/15/2020 8:33 AM) Colon Polyp Removal - Colonoscopy Foot Surgery Right. Knee Surgery Left. Oral Surgery Shoulder Surgery Bilateral. Spinal Surgery - Neck  Diagnostic Studies History [facility] Johnny Boyer, [place]; 02/15/2020 8:33 AM) Colonoscopy 5-10 years ago  Allergies Emmaline Kluver Johnny Boyer, [place]; 02/15/2020 8:34 AM) No Known Drug Allergies [02/15/2020]: Allergies Reconciled  Medication History (Johnny Boyer, CMA; 02/15/2020 8:34 AM) Atorvastatin Calcium (80MG  Tablet, Oral) Active. Aspirin (81MG  Tablet, Oral) Active. Medications Reconciled  Other Problems (Johnny Boyer, CMA; 02/15/2020 8:33 AM) Back Pain Hemorrhoids Hypercholesterolemia Myocardial infarction Sleep Apnea     Review of Systems (Johnny Boyer CMA; 02/15/2020 8:33 AM) General Not Present- Appetite Loss, Chills, Fatigue, Fever, Night Sweats, Weight Gain and Weight Loss. Skin Not Present- Change in Wart/Mole, Dryness, Hives, Jaundice, New Lesions, Non-Healing Wounds, Rash and Ulcer. HEENT Present- Nose Bleed and Wears glasses/contact lenses. Not Present- Earache, Hearing Loss, Hoarseness, Oral Ulcers, Ringing in the Ears, Seasonal Allergies, Sinus Pain, Sore Throat, Visual Disturbances and Yellow Eyes. Respiratory Not Present- Bloody sputum, Chronic Cough, Difficulty Breathing, Snoring and Wheezing. Breast Not Present- Breast Mass, Breast Pain, Nipple Discharge and Skin Changes. Cardiovascular Not Present- Chest Pain, Difficulty Breathing Lying Down, Leg Cramps, Palpitations, Rapid Heart Rate, Shortness of Breath and  Swelling of Extremities. Gastrointestinal Present- Hemorrhoids and Rectal Pain. Not Present- Abdominal Pain, Bloating, Bloody Stool, Change in Bowel Habits, Chronic diarrhea, Constipation, Difficulty Swallowing, Excessive gas, Gets full quickly at meals, Indigestion, Nausea and Vomiting. Male Genitourinary  Present- Impotence. Not Present- Blood in Urine, Change in Urinary Stream, Frequency, Nocturia, Painful Urination, Urgency and Urine Leakage. Musculoskeletal Present- Back Pain. Not Present- Joint Pain, Joint Stiffness, Muscle Pain, Muscle Weakness and Swelling of Extremities. Neurological Not Present- Decreased Memory, Fainting, Headaches, Numbness, Seizures, Tingling, Tremor, Trouble walking and Weakness. Psychiatric Not Present- Anxiety, Bipolar, Change in Sleep Pattern, Depression, Fearful and Frequent crying. Endocrine Not Present- Cold Intolerance, Excessive Hunger, Hair Changes, Heat Intolerance, Hot flashes and New Diabetes. Hematology Not Present- Blood Thinners, Easy Bruising, Excessive bleeding, Gland problems, HIV and Persistent Infections.  Vitals (Johnny Boyer CMA; 02/15/2020 8:34 AM) 02/15/2020 8:34 AM Weight: 196.38 lb Height: 69in Body Surface Area: 2.05 m Body Mass Index: 29 kg/m  Temp.: 96.90F  Pulse: 62 (Regular)  BP: 128/80(Sitting, Left Arm, Standard)        Physical Exam Ardeth Sportsman MD; 02/15/2020 9:26 AM)  General Mental Status-Alert. General Appearance-Not in acute distress, Not Sickly. Orientation-Oriented X3. Hydration-Well hydrated. Voice-Normal.  Integumentary Global Assessment Upon inspection and palpation of skin surfaces of the - Axillae: non-tender, no inflammation or ulceration, no drainage. and Distribution of scalp and body hair is normal. General Characteristics Temperature - normal warmth is noted.  Head and Neck Head-normocephalic, atraumatic with no lesions or palpable masses. Face Global  Assessment - atraumatic, no absence of expression. Neck Global Assessment - no abnormal movements, no bruit auscultated on the right, no bruit auscultated on the left, no decreased range of motion, non-tender. Trachea-midline. Thyroid Gland Characteristics - non-tender.  Eye Eyeball - Left-Extraocular movements intact, No Nystagmus - Left. Eyeball - Right-Extraocular movements intact, No Nystagmus - Right. Cornea - Left-No Hazy - Left. Cornea - Right-No Hazy - Right. Sclera/Conjunctiva - Left-No scleral icterus, No Discharge - Left. Sclera/Conjunctiva - Right-No scleral icterus, No Discharge - Right. Pupil - Left-Direct reaction to light normal. Pupil - Right-Direct reaction to light normal.  ENMT Ears Pinna - Left - no drainage observed, no generalized tenderness observed. Pinna - Right - no drainage observed, no generalized tenderness observed. Nose and Sinuses External Inspection of the Nose - no destructive lesion observed. Inspection of the nares - Left - quiet respiration. Inspection of the nares - Right - quiet respiration. Mouth and Throat Lips - Upper Lip - no fissures observed, no pallor noted. Lower Lip - no fissures observed, no pallor noted. Nasopharynx - no discharge present. Oral Cavity/Oropharynx - Tongue - no dryness observed. Oral Mucosa - no cyanosis observed. Hypopharynx - no evidence of airway distress observed.  Chest and Lung Exam Inspection Movements - Normal and Symmetrical. Accessory muscles - No use of accessory muscles in breathing. Palpation Palpation of the chest reveals - Non-tender. Auscultation Breath sounds - Normal and Clear.  Cardiovascular Auscultation Rhythm - Regular. Murmurs & Other Heart Sounds - Auscultation of the heart reveals - No Murmurs and No Systolic Clicks.  Abdomen Inspection Inspection of the abdomen reveals - No Visible peristalsis and No Abnormal pulsations. Umbilicus - No Bleeding, No Urine  drainage. Palpation/Percussion Palpation and Percussion of the abdomen reveal - Soft, Non Tender, No Rebound tenderness, No Rigidity (guarding) and No Cutaneous hyperesthesia. Note: Abdomen soft. Nontender. Not distended. No umbilical or incisional hernias. No guarding.  Male Genitourinary Sexual Maturity Tanner 5 - Adult hair pattern and Adult penile size and shape. Note: No inguinal hernias. Normal external genitalia. Epididymi, testes, and spermatic cords normal without any masses.  Rectal Note: Perianal skin clear. Large grade 4 right anterior hemorrhoid with external/internal component. Numerous external hemorrhoidal  tags as well especially right posterior and left lateral.  No fissure or fistula or abscess. Irritated internal hemorrhoids as well. Prostate smooth and not large fatty masses. No proctitis. No condyloma. Skin nodule in upper left inner gluteal cleft but not consistent with pilonidal disease. More like flat acrochordon. A few others on his lower back as well.  Peripheral Vascular Upper Extremity Inspection - Left - No Cyanotic nailbeds - Left, Not Ischemic. Inspection - Right - No Cyanotic nailbeds - Right, Not Ischemic.  Neurologic Neurologic evaluation reveals -normal attention span and ability to concentrate, able to name objects and repeat phrases. Appropriate fund of knowledge , normal sensation and normal coordination. Mental Status Affect - not angry, not paranoid. Cranial Nerves-Normal Bilaterally. Gait-Normal.  Neuropsychiatric Mental status exam performed with findings of-able to articulate well with normal speech/language, rate, volume and coherence, thought content normal with ability to perform basic computations and apply abstract reasoning and no evidence of hallucinations, delusions, obsessions or homicidal/suicidal ideation.  Musculoskeletal Global Assessment Spine, Ribs and Pelvis - no instability, subluxation or laxity. Right  Upper Extremity - no instability, subluxation or laxity.  Lymphatic Head & Neck  General Head & Neck Lymphatics: Bilateral - Description - No Localized lymphadenopathy. Axillary  General Axillary Region: Bilateral - Description - No Localized lymphadenopathy. Femoral & Inguinal  Generalized Femoral & Inguinal Lymphatics: Left - Description - No Localized lymphadenopathy. Right - Description - No Localized lymphadenopathy.   Results Ardeth Sportsman MD; 02/15/2020 1:01 PM) Procedures  Name Value Date Hemorrhoids Procedure Other: Perianal skin clear. Large grade 4 right anterior hemorrhoid with external/internal component. Numerous external hemorrhoidal tags as well especially right posterior and left lateral............Marland KitchenNo fissure or fistula or abscess. Irritated internal hemorrhoids as well. Prostate smooth and not large fatty masses. No proctitis. No condyloma. Skin nodule in upper left inner gluteal cleft but not consistent with pilonidal disease. More like flat acrochordon. A few others on his lower back as well.  Performed: 02/15/2020 9:27 AM    Assessment & Plan ( EXTERNAL HEMORRHOIDS WITH COMPLICATION (K64.4) Impression: Enlarged hemorrhoids with especially external component. Right anterior grade 4.  I think he would benefit from hemorrhoid surgery. Ligation pexy with embolectomy of draining tissue. He is interested in proceeding.  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 I cautioned against him going back to work right-of-way really working for home for the first few weeks. Gradually increased physical activity to unrestricted activity by 6 weeks. Questions answered. He is interested in proceeding. We will work to coordinate a convenient time  The anatomy & physiology of the anorectal region was discussed. The pathophysiology of hemorrhoids and differential diagnosis was discussed. Natural history risks without surgery  was discussed. I stressed the importance of a bowel regimen to have daily soft bowel movements to minimize progression of disease. Interventions such as sclerotherapy & banding were discussed.  The patient's symptoms are not adequately controlled by medicines and other non-operative treatments. I feel the risks & problems of no surgery outweigh the operative risks; therefore, I recommended surgery to treat the hemorrhoids by ligation, pexy, and possible resection.  Risks such as bleeding, infection, urinary difficulties, need for further treatment, heart attack, death, and other risks were discussed. I noted a good likelihood this will help address the problem. Goals of post-operative recovery were discussed as well. Possibility that this will not correct all symptoms was explained. Post-operative pain, bleeding, constipation, and other problems after surgery were discussed. We will work to minimize  complications. Educational handouts further explaining the pathology, treatment options, and bowel regimen were given as well. Questions were answered. The patient expresses understanding & wishes to proceed with surgery.   PROLAPSED INTERNAL HEMORRHOIDS, GRADE 4 (K64.3) Impression: Worsening hemorrhoids. I think he would benefit from surgical removal. He agrees.  Current Plans ANOSCOPY, DIAGNOSTIC (28315)  ENCOUNTER FOR PREOPERATIVE EXAMINATION FOR GENERAL SURGICAL PROCEDURE (Z01.818)  Current Plans You are being scheduled for surgery- Our schedulers will call you.  You should hear from our office's scheduling department within 5 working days about the location, date, and time of surgery. We try to make accommodations for patient's preferences in scheduling surgery, but sometimes the OR schedule or the surgeon's schedule prevents [place] from making those accommodations.  If you have not heard from our office [phone]) in 5 working days, call the office and ask for your  surgeon's nurse.  If you have other questions about your diagnosis, plan, or surgery, call the office and ask for your surgeon's nurse.  Pt Education - [facility] Rectal Prep for Anorectal outpatient/office surgery: discussed with patient and provided information. Pt Education - [facility] Rectal Surgery HCI (Lashon Hillier): discussed with patient and provided information.  PROLAPSED INTERNAL HEMORRHOIDS, GRADE 2 (K64.1)   OSA (OBSTRUCTIVE SLEEP APNEA) (G47.33) Impression: He does have sleep apnea but cannot tolerate CPAP. In good chape   PRESENCE OF STENT IN CORONARY ARTERY IN PATIENT WITH CORONARY ARTERY DISEASE (I25.10) Impression: History of coronary disease with stenting in 2009 after myocardial infarction. He is just on aspirin only. He runs 4 miles a day with excellent exercise tolerance.  I do not feel strongly needs cardiac clearance for a low risk surgery. Did not seem to be any concerns from his primary care physician either.  Ardeth Sportsman, MD, FACS, MASCRS Gastrointestinal and Minimally Invasive Surgery  [facility] [address] [place], [place] [address] [phone] Fax [phone] Main/Paging  CONTACT INFORMATION: Weekday (9AM-5PM) concerns: Call [facility] main office at [phone] Weeknight (5PM-9AM) or Weekend/Holiday concerns: Check [email or web address] for General Surgery [facility] coverage (Please, do not use SecureChat as it is not reliable communication to operating surgeons for immediate patient care)            Electronically signed by Karie Soda, MD at 02/15/2020 1:01 PM

## 2020-04-21 NOTE — Discharge Instructions (Signed)
ANORECTAL SURGERY:  POST OPERATIVE INSTRUCTIONS  ######################################################################  EAT Start with a pureed / full liquid diet After 24 hours, gradually transition to a high fiber diet.    CONTROL PAIN Control pain so you can tolerate bowel movements,  walk, sleep, tolerate sneezing/coughing, and go up/down stairs.   HAVE A BOWEL MOVEMENT DAILY Keep your bowels regular to avoid problems.   Taking a fiber supplement every day to keep bowels soft.   Try a laxative to override constipation. Use an antidairrheal to slow down diarrhea.   Call if not better after 2 tries  WALK Walk an hour a day.  Control your pain to do that.   CALL IF YOU HAVE PROBLEMS/CONCERNS Call if you are still struggling despite following these instructions. Call if you have concerns not answered by these instructions  ######################################################################    1. Take your usually prescribed home medications unless otherwise directed.  2. DIET: Follow a light bland diet & liquids the first 24 hours after arrival home, such as soup, liquids, starches, etc.  Be sure to drink plenty of fluids.  Quickly advance to a usual solid diet within a few days.  Avoid fast food or heavy meals as your are more likely to get nauseated or have irregular bowels.  A low-fat, high-fiber diet for the rest of your life is ideal.  3. PAIN CONTROL: a. Pain is best controlled by a usual combination of three different methods TOGETHER: i. Ice/Heat ii. Over the counter pain medication iii. Prescription pain medication b. Expect swelling and discomfort in the anus/rectal area.  Warm water baths (30-60 minutes up to 6 times a day, especially after bowel meovements) will help. Use ice for the first few days to help decrease swelling and bruising, then switch to heat such as warm towels, sitz baths, warm baths, etc to help relax tight/sore spots and speed recovery.   Some people prefer to use ice alone, heat alone, alternating between ice & heat.  Experiment to what works for you.   c. It is helpful to take an over-the-counter pain medication continuously for the first few weeks.  Choose one of the following that works best for you: i. Naproxen (Aleve, etc)  Two 220mg  tabs twice a day ii. Ibuprofen (Advil, etc) Three 200mg  tabs four times a day (every meal & bedtime) - Next dose after 3:15 PM today if needed.  iii. Acetaminophen (Tylenol, etc) 500-650mg  four times a day (every meal & bedtime) - Next dose after 3:15 PM today if needed.  d. A  prescription for pain medication (such as oxycodone, hydrocodone, etc) should be given to you upon discharge.  Take your pain medication as prescribed.  i. If you are having problems/concerns with the prescription medicine (does not control pain, nausea, vomiting, rash, itching, etc), please call [phone] to see if we need to switch you to a different pain medicine that will work better for you and/or control your side effect better. ii. If you need a refill on your pain medication, please contact your pharmacy.  They will contact our office to request authorization. Prescriptions will not be filled after 5 pm or on week-ends.  If can take up to 48 hours for it to be filled & ready so avoid waiting until you are down to thel ast pill. e. A topical cream (Dibucaine) or a prescription for a cream (such as diltiazem 2% gel) may be given to you.  Many people find relief with topical creams.  Some  people find it burns too much.  Experiment.  If it helps, use it.  If it burns, don't using it.  Use a Sitz Bath 4-8 times a day for relief   [employer] A sitz bath is a warm water bath taken in the sitting position that covers only the hips and buttocks. It may be used for either healing or hygiene purposes. Sitz baths are also used to relieve pain, itching, or muscle spasms. The water may contain medicine. Moist heat will help  you heal and relax.  HOME CARE INSTRUCTIONS  Take 3 to 4 sitz baths a day. 1. Fill the bathtub half full with warm water. 2. Sit in the water and open the drain a little. 3. Turn on the warm water to keep the tub half full. Keep the water running constantly. 4. Soak in the water for 15 to 20 minutes. 5. After the sitz bath, pat the affected area dry first.   4. KEEP YOUR BOWELS REGULAR a. The goal is one soft bowel movement a day b. Avoid getting constipated.  Between the surgery and the pain medications, it is common to experience some constipation.  Increasing fluid intake and taking a fiber supplement (such as Metamucil, Citrucel, FiberCon, MiraLax, etc) 2-3 times a day regularly will usually help prevent this problem from occurring.  A mild laxative (prune juice, Milk of Magnesia, MiraLax, etc) should be taken according to package directions if there are no bowel movements after 48 hours. c. Watch out for diarrhea.  If you have many loose bowel movements, simplify your diet to bland foods & liquids for a few days.  Stop any stool softeners and decrease your fiber supplement.  Switching to mild anti-diarrheal medications (Kayopectate, Pepto Bismol) can help.  Can try an imodium/loperamide dose.  If this worsens or does not improve, please call us.  5. Wound Care  a. Remove your bandages with your first bowel movement, usually the day after surgery.  Let the gauze fall off with the first bowel movement or shower.   b. Wear an absorbent pad or soft cotton balls in your underwear as needed to catch any drainage and help keep the area  c. Keep the area clean and dry.  Bathe / shower every day.  Keep the area clean by showering / bathing over the incision / wound.   It is okay to soak an open wound to help wash it.  Consider using a squeeze bottle filled with warm water to gently wash the anal area.  Wet wipes or showers / gentle washing after bowel movements is often less traumatic than regular  toilet paper. d. Bonita Quin will often notice bleeding with bowel movements.  This should slow down by the end of the first week of surgery.  Sitting on an ice pack can help. e. Expect some drainage.  This should slow down by the end of the first week of surgery, but you will have occasional bleeding or drainage up to a few months after surgery.  Wear an absorbent pad or soft cotton gauze in your underwear until the drainage stops.  6. ACTIVITIES as tolerated:   a. You may resume regular (light) daily activities beginning the next day--such as daily self-care, walking, climbing stairs--gradually increasing activities as tolerated.  If you can walk 30 minutes without difficulty, it is safe to try more intense activity such as jogging, treadmill, bicycling, low-impact aerobics, swimming, etc. b. Save the most intensive and strenuous activity for last such as  sit-ups, heavy lifting, contact sports, etc  Refrain from any heavy lifting or straining until you are off narcotics for pain control.   c. DO NOT PUSH THROUGH PAIN.  Let pain be your guide: If it hurts to do something, don't do it.  Pain is your body warning you to avoid that activity for another week until the pain goes down. d. You may drive when you are no longer taking prescription pain medication, you can comfortably sit for long periods of time, and you can safely maneuver your car and apply brakes. e. Bonita Quin may have sexual intercourse when it is comfortable.  7. FOLLOW UP in our office a. Please call [facility] at [phone] to set up an appointment to see your surgeon in the office for a follow-up appointment approximately 2-3 weeks after your surgery. b. Make sure that you call for this appointment the day you arrive home to ensure a convenient appointment time.  8. IF YOU HAVE DISABILITY OR FAMILY LEAVE FORMS, BRING THEM TO THE OFFICE FOR PROCESSING.  DO NOT GIVE THEM TO YOUR DOCTOR.        WHEN TO CALL us [phone]: 1. Poor pain  control 2. Reactions / problems with new medications (rash/itching, nausea, etc)  3. Fever over 101.5 F (38.5 C) 4. Inability to urinate 5. Nausea and/or vomiting 6. Worsening swelling or bruising 7. Continued bleeding from incision. 8. Increased pain, redness, or drainage from the incision  The clinic staff is available to answer your questions during regular business hours (8:30am-5pm).  Please don't hesitate to call and ask to speak to one of our nurses for clinical concerns.   A surgeon from [facility] is always on call at the hospitals   If you have a medical emergency, go to the nearest emergency room or call 911.    [facility] [address], [address], [place], [place]  [address] ? MAIN: [phone] ? TOLL FREE: [phone] ? FAX [phone] [email or web address]   HEMORRHOIDS   Hemorrhoidal piles are natural clusters of blood vessels that help the rectum and anal canal stretch to hold stool and allow bowel movements.  Most people will develop a flare of hemorrhoids in their lifetime.  When hemorrhoidals are irritated, they can swell, burn, itch, cause pain, and bleed.  Most flares will calm down gradually within a few weeks.  However, once hemorrhoids are created, they tend to flare more easily.  Fortunately, good habits and simple medical treatment usually control hemorrhoids well, and surgery is needed only in severe cases.  TREATMENT OF HEMORRHOID FLARE Warm soaks. 4-8 times a day This helps more than any topical medication.   1. A sitz bath is a warm water bath taken in the sitting position that covers only the hips and buttocks.Fill the bathtub half full with warm water. 2. Soak in the water for 15 to 30 minutes. 3. After the sitz bath, pat the affected area dry first.  Normalize your bowels.  Extremes of diarrhea or constipation will make hemorrhoids worse.  One soft bowel movement a day is the goal.   Wet wipes  instead of toilet paper Pain control with a NSAID such as ibuprofen (Advil) or naproxen (Aleve) or acetaminophen (Tylenol) around the clock.  Narcotics are constipating and should be minimized if possible Topical creams contain steroids (bydrocortisone) or local anesthetic (xylocaine) can help make pain and itching more tolerable.    TROUBLESHOOTING IRREGULAR BOWELS 1) Avoid extremes of bowel  movements (no bad constipation/diarrhea) 2) Miralax 17gm in 8oz. water or juice every day. May use twice a day.  3) Gas-x or Phazyme as needed for gas & bloating.  4) Soft & bland diet. No spicy, greasy, or fried foods.  5) Omeprazole over-the-counter as needed  6) May hold gluten/wheat products from diet to see if symptoms improve.  7)  May try probiotics (Align, Activa, etc) to help calm the bowels down 7) If symptoms become worse: Call back immediately.   Post Anesthesia Home Care Instructions  Activity: Get plenty of rest for the remainder of the day. A responsible individual must stay with you for 24 hours following the procedure.  For the next 24 hours, DO NOT: -Drive a car -Paediatric nurse -Drink alcoholic beverages -Take any medication unless instructed by your physician -Make any legal decisions or sign important papers.  Meals: Start with liquid foods such as gelatin or soup. Progress to regular foods as tolerated. Avoid greasy, spicy, heavy foods. If nausea and/or vomiting occur, drink only clear liquids until the nausea and/or vomiting subsides. Call your physician if vomiting continues.  Special Instructions/Symptoms: Your throat may feel dry or sore from the anesthesia or the breathing tube placed in your throat during surgery. If this causes discomfort, gargle with warm salt water. The discomfort should disappear within 24 hours.     Information for Discharge Teaching: EXPAREL (bupivacaine liposome injectable suspension)   Your surgeon or anesthesiologist gave you  EXPAREL(bupivacaine) to help control your pain after surgery.   EXPAREL is a local anesthetic that provides pain relief by numbing the tissue around the surgical site.  EXPAREL is designed to release pain medication over time and can control pain for up to 72 hours.  Depending on how you respond to EXPAREL, you may require less pain medication during your recovery.  Possible side effects:  Temporary loss of sensation or ability to move in the area where bupivacaine was injected.  Nausea, vomiting, constipation  Rarely, numbness and tingling in your mouth or lips, lightheadedness, or anxiety may occur.  Call your doctor right away if you think you may be experiencing any of these sensations, or if you have other questions regarding possible side effects.  Follow all other discharge instructions given to you by your surgeon or nurse. Eat a healthy diet and drink plenty of water or other fluids.  If you return to the hospital for any reason within 96 hours following the administration of EXPAREL, it is important for health care providers to know that you have received this anesthetic. A teal colored band has been placed on your arm with the date, time and amount of EXPAREL you have received in order to alert and inform your health care providers. Please leave this armband in place for the full 96 hours following administration, and then you may remove the band (Monday, January 10th).

## 2020-04-21 NOTE — Op Note (Signed)
04/21/2020  1:04 PM  PATIENT:  Johnny Boyer  65 y.o. male  Patient Care Team: Georgina Quint, MD as PCP - General (Internal Medicine) Karie Soda, MD as Consulting Physician (General Surgery)  PRE-OPERATIVE DIAGNOSIS:  hemorrhoids prolapsing grade 3 with bleeding and pain  POST-OPERATIVE DIAGNOSIS:  hemorrhoids prolapsing grade 3 with bleeding and pain  PROCEDURE:  Procedure(s): HEMORRHOIDECTOMY X3 WITH LIGATION AND HEMORRHOIDOPEXY ANORECTAL EXAMINATION UNDER ANESTHESIA  Internal and external hemorrhoidectomy x3 Internal hemorrhoidal ligation and pexy Anorectal examination under anesthesia  SURGEON:  Ardeth Sportsman, MD  ANESTHESIA:   General Anorectal & Local field block (0.25% bupivacaine with epinephrine mixed with Liposomal bupivacaine (Experel)   EBL:  Total I/O In: -  Out: 50 [Blood:50].  See operative record  Delay start of Pharmacological VTE agent (>24hrs) due to surgical blood loss or risk of bleeding:  NO  DRAINS: NONE  SPECIMEN:   Internal & external hemorrhoid x3  DISPOSITION OF SPECIMEN:  PATHOLOGY  COUNTS:  YES  PLAN OF CARE: Discharge home after PACU  PATIENT DISPOSITION:  PACU - hemodynamically stable.  INDICATION: Pleasant patient with struggles with hemorrhoids.  Not able to be managed in the office despite an improved bowel regimen.  I recommended examination under anesthesia and surgical treatment:  The anatomy & physiology of the anorectal region was discussed.  The pathophysiology of hemorrhoids and differential diagnosis was discussed.  Natural history risks without surgery was discussed.   I stressed the importance of a bowel regimen to have daily soft bowel movements to minimize progression of disease.  Interventions such as sclerotherapy & banding were discussed.  The patient's symptoms are not adequately controlled by medicines and other non-operative treatments.  I feel the risks & problems of no surgery outweigh the operative  risks; therefore, I recommended surgery to treat the hemorrhoids by ligation, pexy, and possible resection.  Risks such as bleeding, infection, need for further treatment, heart attack, death, and other risks were discussed.   I noted a good likelihood this will help address the problem.  Goals of post-operative recovery were discussed as well.  Possibility that this will not correct all symptoms was explained.  Post-operative pain, bleeding, constipation, urinary difficulties, and other problems after surgery were discussed.  We will work to minimize complications.   Educational handouts further explaining the pathology, treatment options, and bowel regimen were given as well.  Questions were answered.  The patient expresses understanding & wishes to proceed with surgery.  OR FINDINGS: Patient had very enlarged hemorrhoids. Right anterior grade 4 with external component. Right posterior grade 4. Left lateral at least grade 3. Hemorrhoidal ligation/pexy/excisions done.  DESCRIPTION:   Informed consent was confirmed. Patient underwent general anesthesia without difficulty. Patient was placed into prone positioning.  The perianal region was prepped and draped in sterile fashion. Surgical time-out confirmed our plan.  I did digital rectal examination and then transitioned over to anoscopy to get a sense of the anatomy.  Findings noted above.   I proceeded to do hemorrhoidal ligation and pexy.  I used a 2-0 Vicryl suture on a UR-6 needle in a figure-of-eight fashion 6 cm proximal to the anal verge.  I started at the largest hemorrhoid pile.  Because of redundant hemorrhoidal tissue too bulky to merely ligate or pexy, I excised the excess internal hemorrhoid piles longitudinally in a fusiform biconcave fashion, sparing the anal canal to avoid narrowing.  I then ran that stitch longitudinally more distally to close the hemorrhoidectomy wound to  the anal verge over a large Parks self retaining retractor to  avoid narrowing of the anal canal.  I then tied that stitch down to cause a hemorrhoidopexy.   I also had to do an excision at the  right anterior, right posterior and left lateral pile locations.  I then did hemorrhoidal ligation and pexy at the other 3 hemorrhoidal columns.  At the completion of this, all 6 anorectal columns were ligated and pexied in the classic hexagonal fashion (right anterior/lateral/posterior, left anterior/lateral/posterior). There still is some redundant hemorrhoidal tissue on the left lateral and right anterior aspect but I carefully trimmed over a large Sawyer anal retractor.   I closed the external part of the hemorrhoidectomy wounds with interrupted horizontal mattress 2-0 chromic suture, leaving the last 5 mm open to allow natural drainage.    I redid anoscopy & examination.  At completion of this, all hemorrhoids had been removed or reduced into the rectum.  There is no more prolapse.  Internal & external anatomy was more more normal.  Hemostasis was good.  Fluffed gauze was on-laid over the perianal region.  No packing done.  Patient is being extubated go to go to the recovery room.  I had discussed postop care in detail with the patient in the preop holding area.  Instructions for post-operative recovery and prescriptions are written.  I made an attempt to locate family to discuss patient's status and recommendations.  No one is available at this time   Ardeth Sportsman, M.D., F.A.C.S. Gastrointestinal and Minimally Invasive Surgery [facility] [address], [address] [place], [place] [address] [phone] Main / Paging

## 2020-04-21 NOTE — Anesthesia Postprocedure Evaluation (Signed)
Anesthesia Post Note  Patient: Johnny Boyer  Procedure(s) Performed: HEMORRHOIDECTOMY X3 WITH LIGATION AND HEMORRHOIDOPEXY ANORECTAL EXAMINATION UNDER ANESTHESIA (N/A Rectum)     Patient location during evaluation: PACU Anesthesia Type: General Level of consciousness: awake and alert Pain management: pain level controlled Vital Signs Assessment: post-procedure vital signs reviewed and stable Respiratory status: spontaneous breathing, nonlabored ventilation, respiratory function stable and patient connected to nasal cannula oxygen Cardiovascular status: blood pressure returned to baseline and stable Postop Assessment: no apparent nausea or vomiting Anesthetic complications: no   No complications documented.  Last Vitals:  Vitals:   04/21/20 1312 04/21/20 1315  BP: 138/80 138/80  Pulse: (!) 57 (!) 54  Resp: 17 13  Temp: (!) 36.3 C   SpO2: 100% 100%    Last Pain:  Vitals:   04/21/20 1312  TempSrc:   PainSc: 0-No pain                 Trevor Iha

## 2020-04-21 NOTE — Transfer of Care (Addendum)
Immediate Anesthesia Transfer of Care Note  Patient: Johnny Boyer  Procedure(s) Performed: Procedure(s) (LRB): HEMORRHOIDECTOMY X3 WITH LIGATION AND HEMORRHOIDOPEXY ANORECTAL EXAMINATION UNDER ANESTHESIA (N/A)  Patient Location: PACU  Anesthesia Type: General  Level of Consciousness: awake, sedated, patient cooperative and responds to stimulation  Airway & Oxygen Therapy: Patient Spontanous Breathing and Patient connected to [place] 02 and soft FM   Post-op Assessment: Report given to PACU RN, Post -op Vital signs reviewed and stable and Patient moving all extremities  Post vital signs: Reviewed and stable  Complications: No apparent anesthesia complications

## 2020-04-21 NOTE — Anesthesia Procedure Notes (Signed)
Procedure Name: Intubation Date/Time: 04/21/2020 11:43 AM Performed by: Justice Rocher, CRNA Pre-anesthesia Checklist: Patient identified, Emergency Drugs available, Suction available, Patient being monitored and Timeout performed Patient Re-evaluated:Patient Re-evaluated prior to induction Oxygen Delivery Method: Circle system utilized Preoxygenation: Pre-oxygenation with 100% oxygen Induction Type: IV induction Ventilation: Mask ventilation without difficulty Laryngoscope Size: Mac and 4 Grade View: Grade II Tube type: Oral Tube size: 7.5 mm Number of attempts: 1 Airway Equipment and Method: Stylet and Oral airway Placement Confirmation: ETT inserted through vocal cords under direct vision,  positive ETCO2,  breath sounds checked- equal and bilateral and CO2 detector Secured at: 23 cm Tube secured with: Tape Dental Injury: Teeth and Oropharynx as per pre-operative assessment

## 2020-04-22 ENCOUNTER — Encounter (HOSPITAL_BASED_OUTPATIENT_CLINIC_OR_DEPARTMENT_OTHER): Payer: Self-pay | Admitting: Surgery

## 2020-04-22 LAB — SURGICAL PATHOLOGY

## 2020-05-01 ENCOUNTER — Other Ambulatory Visit: Payer: Self-pay

## 2020-05-01 ENCOUNTER — Ambulatory Visit (HOSPITAL_COMMUNITY)
Admission: EM | Admit: 2020-05-01 | Discharge: 2020-05-01 | Disposition: A | Discharge status: Home / Self Care | Payer: Medicare Other

## 2020-05-01 ENCOUNTER — Ambulatory Visit (INDEPENDENT_AMBULATORY_CARE_PROVIDER_SITE_OTHER): Payer: Medicare Other

## 2020-05-01 ENCOUNTER — Encounter (HOSPITAL_COMMUNITY): Payer: Self-pay | Admitting: *Deleted

## 2020-05-01 DIAGNOSIS — M25531 Pain in right wrist: Secondary | ICD-10-CM

## 2020-05-01 DIAGNOSIS — S6991XA Unspecified injury of right wrist, hand and finger(s), initial encounter: Secondary | ICD-10-CM | POA: Diagnosis not present

## 2020-05-01 DIAGNOSIS — R030 Elevated blood-pressure reading, without diagnosis of hypertension: Secondary | ICD-10-CM

## 2020-05-01 MED ORDER — IBUPROFEN 600 MG PO TABS: 600.0000 mg | ORAL_TABLET | Freq: Four times a day (QID) | ORAL | 0 refills | Status: DC | PRN

## 2020-05-01 NOTE — ED Triage Notes (Signed)
Pt reports waking up in middle of night Friday night/Sat morning, punching pillow from vivid dream.  C/O significant right wrist pain with swelling extending into right hand.  States unable to grip with right hand due to wrist pain.  RUE CMS intact; right hand and fingers slightly cooler to touch than left hand.

## 2020-05-01 NOTE — Discharge Instructions (Addendum)
Take the ibuprofen as prescribed.  Rest and elevate your wrist.  Apply ice packs 2-3 times a day for up to 20 minutes each.  Wear the wrist splint as needed for comfort.    Follow up with an orthopedist if your symptoms are not improving.    Your blood pressure is elevated today at 188/99.  Please have this rechecked by your primary care provider in 1-2 weeks.

## 2020-05-01 NOTE — ED Provider Notes (Signed)
[facility]    CSN: [id number] Arrival date & time: 05/01/20  1104      History   Chief Complaint Chief Complaint  Patient presents with  . Wrist Injury    HPI Johnny Boyer is a 65 y.o. male.   Patient presents with pain and swelling in his right wrist since punching a pillow Friday night.  He denies numbness, weakness, paresthesias, open wounds, redness, ecchymosis, or other symptoms.  No treatments attempted at home.  He has remote history of previous fracture of his right wrist requiring surgical repair.  His medical history includes MI, CAD, dyslipidemia.  The history is provided by the patient and medical records.    Past Medical History:  Diagnosis Date  . Hemorrhoids   . Hyperlipidemia   . Myocardial infarction (HCC) 2009  . Sleep apnea    no cpap used did not tolerate, moderate osa    Patient Active Problem List   Diagnosis Date Noted  . Prolapsed internal hemorrhoids, grade 4, s/p ligation/pexy/hemorrhoidectomy 04/21/2020  . History of coronary artery disease 12/15/2019  . External hemorrhoids 12/15/2019  . Dyslipidemia 12/15/2019    Past Surgical History:  Procedure Laterality Date  . CARDIAC CATHETERIZATION  2009   stents to mid rca x 2  . EVALUATION UNDER ANESTHESIA WITH HEMORRHOIDECTOMY N/A 04/21/2020   Procedure: HEMORRHOIDECTOMY X3 WITH LIGATION AND HEMORRHOIDOPEXY ANORECTAL EXAMINATION UNDER ANESTHESIA;  Surgeon: Karie Soda, MD;  Location: [facility] [place];  Service: General;  Laterality: N/A;  . FRACTURE SURGERY  1974   right wrist  . SPINE SURGERY  2008 and 2009   cervical C 3 C4 C5 fusion       Home Medications    Prior to Admission medications   Medication Sig Start Date End Date Taking? Authorizing Provider  Acetaminophen (TYLENOL PO) Take by mouth.   Yes [provider]  aspirin EC 81 MG tablet Take 81 mg by mouth daily.   Yes [provider]  atorvastatin (LIPITOR) 80 MG tablet Take 1 tablet  (80 mg total) by mouth daily. 12/15/19  Yes Sagardia, Eilleen Kempf, MD  diazepam (VALIUM) 5 MG tablet Take 1 tablet (5 mg total) by mouth every 6 (six) hours as needed for muscle spasms (difficulty urinating). 04/21/20  Yes Karie Soda, MD  ibuprofen (ADVIL) 600 MG tablet Take 1 tablet (600 mg total) by mouth every 6 (six) hours as needed. 05/01/20  Yes Mickie Bail, NP  oxyCODONE (OXY IR/ROXICODONE) 5 MG immediate release tablet Take 1-2 tablets (5-10 mg total) by mouth every 6 (six) hours as needed for moderate pain, severe pain or breakthrough pain. 04/21/20  Loletta Specter, MD    Family History Family History  Problem Relation Age of Onset  . Heart disease Mother   . Hyperlipidemia Mother   . Hyperlipidemia Sister   . Hypertension Sister   . Hyperlipidemia Brother   . Hypertension Brother     Social History Social History   Tobacco Use  . Smoking status: Never Smoker  . Smokeless tobacco: Never Used  Vaping Use  . Vaping Use: Never used  Substance Use Topics  . Alcohol use: Yes    Comment: 1 or 2 beers per day  . Drug use: Never     Allergies   Patient has no known allergies.   Review of Systems Review of Systems  Constitutional: Negative for chills and fever.  HENT: Negative for ear pain and sore throat.   Eyes: Negative for  pain and visual disturbance.  Respiratory: Negative for cough and shortness of breath.   Cardiovascular: Negative for chest pain and palpitations.  Gastrointestinal: Negative for abdominal pain and vomiting.  Genitourinary: Negative for dysuria and hematuria.  Musculoskeletal: Positive for arthralgias. Negative for back pain.  Skin: Negative for color change, rash and wound.  Neurological: Negative for syncope, weakness and numbness.  All other systems reviewed and are negative.    Physical Exam Triage Vital Signs ED Triage Vitals  Enc Vitals Group     BP      Pulse      Resp      Temp      Temp src      SpO2      Weight       Height      Head Circumference      Peak Flow      Pain Score      Pain Loc      Pain Edu?      Excl. in GC?    No data found.  Updated Vital Signs BP (!) 188/99   Pulse 61   Temp 98.1 F (36.7 C) (Oral)   Resp 16   SpO2 95%   Visual Acuity Right Eye Distance:   Left Eye Distance:   Bilateral Distance:    Right Eye Near:   Left Eye Near:    Bilateral Near:     Physical Exam Vitals and nursing note reviewed.  Constitutional:      Appearance: He is well-developed and well-nourished.  HENT:     Head: Normocephalic and atraumatic.     Mouth/Throat:     Mouth: Mucous membranes are moist.  Eyes:     Conjunctiva/sclera: Conjunctivae normal.  Cardiovascular:     Rate and Rhythm: Normal rate and regular rhythm.     Heart sounds: Normal heart sounds.  Pulmonary:     Effort: Pulmonary effort is normal. No respiratory distress.     Breath sounds: Normal breath sounds.  Abdominal:     Palpations: Abdomen is soft.     Tenderness: There is no abdominal tenderness.  Musculoskeletal:        General: Swelling and tenderness present. No deformity or edema.     Cervical back: Neck supple.  Skin:    General: Skin is warm and dry.     Capillary Refill: Capillary refill takes less than 2 seconds.     Findings: No bruising, erythema, lesion or rash.  Neurological:     General: No focal deficit present.     Mental Status: He is alert and oriented to person, place, and time.     Sensory: No sensory deficit.     Motor: No weakness.     Gait: Gait normal.  Psychiatric:        Mood and Affect: Mood and affect and mood normal.        Behavior: Behavior normal.      UC Treatments / Results  Labs (all labs ordered are listed, but only abnormal results are displayed) Labs Reviewed - No data to display  EKG   Radiology DG Wrist Complete Right  Result Date: 05/01/2020 CLINICAL DATA:  Acute right wrist pain after injury 2 days ago. EXAM: RIGHT WRIST - COMPLETE 3+ VIEW  COMPARISON:  None. FINDINGS: There is no evidence of fracture or dislocation. Moderate degenerative changes seen involving the first carpometacarpal joint. Soft tissues are unremarkable. 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 No  acute abnormality seen in the right wrist. Electronically Signed   By: Lupita Raider M.D.   On: 05/01/2020 12:00    Procedures Procedures (including critical care time)  Medications Ordered in UC Medications - No data to display  Initial Impression / Assessment and Plan / UC Course  I have reviewed the triage vital signs and the nursing notes.  Pertinent labs & imaging results that were available during my care of the patient were reviewed by me and considered in my medical decision making (see chart for details).   Right wrist pain.  Elevated blood pressure reading.  X-ray negative for acute abnormality.  Treating with ibuprofen, rest, elevation, ice packs, wrist splint.  Instructed patient to follow-up with orthopedic hand specialist if his symptoms are not improving.  Discussed with patient that his blood pressure is elevated today and needs to be rechecked by his PCP in 1 to 2 weeks.  He agrees to plan of care.   Final Clinical Impressions(s) / UC Diagnoses   Final diagnoses:  Right wrist pain  Elevated blood pressure reading     Discharge Instructions     Take the ibuprofen as prescribed.  Rest and elevate your wrist.  Apply ice packs 2-3 times a day for up to 20 minutes each.  Wear the wrist splint as needed for comfort.    Follow up with an orthopedist if your symptoms are not improving.    Your blood pressure is elevated today at 188/99.  Please have this rechecked by your primary care provider in 1-2 weeks.         ED Prescriptions    Medication Sig Dispense Auth. Provider   ibuprofen (ADVIL) 600 MG tablet Take 1 tablet (600 mg total) by mouth every 6 (six) hours as needed. 30 tablet Mickie Bail, NP      PDMP not reviewed this encounter.   Mickie Bail, NP 05/01/20 [phone]

## 2020-05-16 ENCOUNTER — Encounter (HOSPITAL_COMMUNITY): Payer: Self-pay

## 2020-05-16 ENCOUNTER — Other Ambulatory Visit: Payer: Self-pay

## 2020-05-16 ENCOUNTER — Ambulatory Visit (HOSPITAL_COMMUNITY)
Admission: EM | Admit: 2020-05-16 | Discharge: 2020-05-16 | Disposition: A | Discharge status: Home / Self Care | Payer: Medicare Other | Attending: Internal Medicine | Admitting: Internal Medicine

## 2020-05-16 DIAGNOSIS — M545 Low back pain, unspecified: Secondary | ICD-10-CM

## 2020-05-16 MED ORDER — KETOROLAC TROMETHAMINE 60 MG/2ML IM SOLN
30.0000 mg | Freq: Once | INTRAMUSCULAR | Status: AC
  Administered 2020-05-16: 30 mg via INTRAMUSCULAR

## 2020-05-16 MED ORDER — IBUPROFEN 600 MG PO TABS: 600.0000 mg | ORAL_TABLET | Freq: Four times a day (QID) | ORAL | 0 refills | Status: DC | PRN

## 2020-05-16 MED ORDER — KETOROLAC TROMETHAMINE 60 MG/2ML IM SOLN: 60.0000 mg | Freq: Once | INTRAMUSCULAR | Status: DC

## 2020-05-16 MED ORDER — KETOROLAC TROMETHAMINE 30 MG/ML IJ SOLN
INTRAMUSCULAR | Status: AC
  Filled 2020-05-16: qty 1

## 2020-05-16 NOTE — ED Triage Notes (Signed)
Patient presents to Urgent Care with complaints of left lower back pain since Friday. Has a hx of muscle spasms. Treating discomfort with Flexeril.   Denies fever, hematuria.

## 2020-05-16 NOTE — Discharge Instructions (Signed)
Follow up at urgent care if symptoms worsen  Use ibuprofen as needed for pain management as well as muscle relaxer, use heating pad 15 minutes on and off for comfort,  continue to do stretching exercises,

## 2020-05-16 NOTE — ED Provider Notes (Signed)
[facility]    CSN: [id number] Arrival date & time: 05/16/20  7989      History   Chief Complaint Chief Complaint  Patient presents with  . Back Pain    HPI Johnny Boyer is a 65 y.o. male.   Patient c/o of sharp constant left lower back pain at 10/10, starting Friday evening. Not precipitated by any event. No numbness, no tingling, non-radiating. Similar occurrence years prior but on right side. History of bulging disk which required 6 week course of PT. Attempted use of flexeril and stretching exercises with no relief.   Past Medical History:  Diagnosis Date  . Hemorrhoids   . Hyperlipidemia   . Myocardial infarction (HCC) 2009  . Sleep apnea    no cpap used did not tolerate, moderate osa    Patient Active Problem List   Diagnosis Date Noted  . Prolapsed internal hemorrhoids, grade 4, s/p ligation/pexy/hemorrhoidectomy 04/21/2020  . History of coronary artery disease 12/15/2019  . External hemorrhoids 12/15/2019  . Dyslipidemia 12/15/2019    Past Surgical History:  Procedure Laterality Date  . CARDIAC CATHETERIZATION  2009   stents to mid rca x 2  . EVALUATION UNDER ANESTHESIA WITH HEMORRHOIDECTOMY N/A 04/21/2020   Procedure: HEMORRHOIDECTOMY X3 WITH LIGATION AND HEMORRHOIDOPEXY ANORECTAL EXAMINATION UNDER ANESTHESIA;  Surgeon: Karie Soda, MD;  Location: [facility] [place];  Service: General;  Laterality: N/A;  . FRACTURE SURGERY  1974   right wrist  . SPINE SURGERY  2008 and 2009   cervical C 3 C4 C5 fusion       Home Medications    Prior to Admission medications   Medication Sig Start Date End Date Taking? Authorizing Provider  Aspirin Buf,CaCarb-MgCarb-MgO, 81 MG TABS Take by mouth. 03/15/08  Yes [provider]  Acetaminophen (TYLENOL PO) Take by mouth.    [provider]  aspirin EC 81 MG tablet Take 81 mg by mouth daily.    [provider]  atorvastatin (LIPITOR) 80 MG tablet Take 1 tablet (80 mg  total) by mouth daily. 12/15/19   Georgina Quint, MD  cyclobenzaprine (FLEXERIL) 5 MG tablet Take 5 mg by mouth 2 (two) times daily. 05/13/20   [provider]  diazepam (VALIUM) 5 MG tablet Take 1 tablet (5 mg total) by mouth every 6 (six) hours as needed for muscle spasms (difficulty urinating). 04/21/20   Karie Soda, MD  ibuprofen (ADVIL) 600 MG tablet Take 1 tablet (600 mg total) by mouth every 6 (six) hours as needed. 05/01/20   Mickie Bail, NP  oxyCODONE (OXY IR/ROXICODONE) 5 MG immediate release tablet Take 1-2 tablets (5-10 mg total) by mouth every 6 (six) hours as needed for moderate pain, severe pain or breakthrough pain. 04/21/20   Karie Soda, MD    Family History Family History  Problem Relation Age of Onset  . Heart disease Mother   . Hyperlipidemia Mother   . Hyperlipidemia Sister   . Hypertension Sister   . Hyperlipidemia Brother   . Hypertension Brother     Social History Social History   Tobacco Use  . Smoking status: Never Smoker  . Smokeless tobacco: Never Used  Vaping Use  . Vaping Use: Never used  Substance Use Topics  . Alcohol use: Yes    Comment: 1 or 2 beers per day  . Drug use: Never     Allergies   Patient has no known allergies.   Review of Systems Review of Systems  Constitutional:  Negative.   Respiratory: Negative.   Cardiovascular: Negative.   Genitourinary: Negative.   Musculoskeletal: Positive for back pain. Negative for arthralgias, gait problem, joint swelling, myalgias, neck pain and neck stiffness.  Skin: Negative.   Neurological: Negative.      Physical Exam Triage Vital Signs ED Triage Vitals  Enc Vitals Group     BP 05/16/20 0948 (!) 158/96     Pulse Rate 05/16/20 0948 62     Resp 05/16/20 0948 16     Temp 05/16/20 0948 98.1 F (36.7 C)     Temp Source 05/16/20 0948 Oral     SpO2 05/16/20 0948 97 %     Weight 05/16/20 0944 185 lb (83.9 kg)     Height --      Head Circumference --      Peak Flow --       Pain Score 05/16/20 0944 10     Pain Loc --      Pain Edu? --      Excl. in GC? --    No data found.  Updated Vital Signs BP (!) 158/96 (BP Location: Right Arm)   Pulse 62   Temp 98.1 F (36.7 C) (Oral)   Resp 16   Wt 185 lb (83.9 kg)   SpO2 97%   BMI 27.32 kg/m   Visual Acuity Right Eye Distance:   Left Eye Distance:   Bilateral Distance:    Right Eye Near:   Left Eye Near:    Bilateral Near:     Physical Exam Constitutional:      Appearance: Normal appearance. He is normal weight.  HENT:     Head: Normocephalic.  Eyes:     Extraocular Movements: Extraocular movements intact.  Pulmonary:     Effort: Pulmonary effort is normal.  Musculoskeletal:        General: Tenderness (lateral left lumbar region, tightness noted ) present. No swelling or deformity. Normal range of motion.     Cervical back: Normal range of motion.  Skin:    General: Skin is warm and dry.  Neurological:     General: No focal deficit present.     Mental Status: He is alert and oriented to person, place, and time. Mental status is at baseline.     Motor: No weakness.     Coordination: Coordination normal.     Gait: Gait normal.      UC Treatments / Results  Labs (all labs ordered are listed, but only abnormal results are displayed) Labs Reviewed - No data to display  EKG   Radiology No results found.  Procedures Procedures (including critical care time)  Medications Ordered in UC Medications - No data to display  Initial Impression / Assessment and Plan / UC Course  I have reviewed the triage vital signs and the nursing notes.  Pertinent labs & imaging results that were available during my care of the patient were reviewed by me and considered in my medical decision making (see chart for details).   Lower Back Pain Continue use of ibuprofen and muscle relaxer as needed. IM Toradol given today. Encouraged use of heating pad and gentle ROM stretching. Follow up for  worsening symptoms.    Final Clinical Impressions(s) / UC Diagnoses   Final diagnoses:  None   Discharge Instructions   None    ED Prescriptions    None     PDMP not reviewed this encounter.   Valinda Hoar, NP 05/16/20 1032

## 2020-05-17 ENCOUNTER — Encounter: Payer: Self-pay | Admitting: Emergency Medicine

## 2020-05-19 NOTE — ED Provider Notes (Signed)
[facility]    CSN: [id number] Arrival date & time: 05/16/20  6063      History   Chief Complaint Chief Complaint  Patient presents with  . Back Pain    HPI Johnny Boyer is a 65 y.o. male comes to urgent care with lower back pain which started on Friday.  Symptoms onset was insidious and has been persistent.  No fall or injury to the back.  No numbness or tingling in the lower extremities.  No nausea or vomiting.  No fever or chills.   Pain is of moderate severity, sharp and throbbing at times.  Patient denies any radiation of pain into the legs.  No numbness or tingling.  HPI  Past Medical History:  Diagnosis Date  . Hemorrhoids   . Hyperlipidemia   . Myocardial infarction (HCC) 2009  . Sleep apnea    no cpap used did not tolerate, moderate osa    Patient Active Problem List   Diagnosis Date Noted  . Prolapsed internal hemorrhoids, grade 4, s/p ligation/pexy/hemorrhoidectomy 04/21/2020  . History of coronary artery disease 12/15/2019  . External hemorrhoids 12/15/2019  . Dyslipidemia 12/15/2019    Past Surgical History:  Procedure Laterality Date  . CARDIAC CATHETERIZATION  2009   stents to mid rca x 2  . EVALUATION UNDER ANESTHESIA WITH HEMORRHOIDECTOMY N/A 04/21/2020   Procedure: HEMORRHOIDECTOMY X3 WITH LIGATION AND HEMORRHOIDOPEXY ANORECTAL EXAMINATION UNDER ANESTHESIA;  Surgeon: Karie Soda, MD;  Location: [facility] [place];  Service: General;  Laterality: N/A;  . FRACTURE SURGERY  1974   right wrist  . SPINE SURGERY  2008 and 2009   cervical C 3 C4 C5 fusion       Home Medications    Prior to Admission medications   Medication Sig Start Date End Date Taking? Authorizing Provider  Aspirin Buf,CaCarb-MgCarb-MgO, 81 MG TABS Take by mouth. 03/15/08  Yes [provider]  Acetaminophen (TYLENOL PO) Take by mouth.    [provider]  aspirin EC 81 MG tablet Take 81 mg by mouth daily.    [provider]   atorvastatin (LIPITOR) 80 MG tablet Take 1 tablet (80 mg total) by mouth daily. 12/15/19   Georgina Quint, MD  cyclobenzaprine (FLEXERIL) 5 MG tablet Take 5 mg by mouth 2 (two) times daily. 05/13/20   [provider]  diazepam (VALIUM) 5 MG tablet Take 1 tablet (5 mg total) by mouth every 6 (six) hours as needed for muscle spasms (difficulty urinating). 04/21/20   Karie Soda, MD  ibuprofen (ADVIL) 600 MG tablet Take 1 tablet (600 mg total) by mouth every 6 (six) hours as needed. 05/16/20   White, Elita Boone, NP  oxyCODONE (OXY IR/ROXICODONE) 5 MG immediate release tablet Take 1-2 tablets (5-10 mg total) by mouth every 6 (six) hours as needed for moderate pain, severe pain or breakthrough pain. 04/21/20   Karie Soda, MD    Family History Family History  Problem Relation Age of Onset  . Heart disease Mother   . Hyperlipidemia Mother   . Hyperlipidemia Sister   . Hypertension Sister   . Hyperlipidemia Brother   . Hypertension Brother     Social History Social History   Tobacco Use  . Smoking status: Never Smoker  . Smokeless tobacco: Never Used  Vaping Use  . Vaping Use: Never used  Substance Use Topics  . Alcohol use: Yes    Comment: 1 or 2 beers per day  . Drug use: Never  Allergies   Patient has no known allergies.   Review of Systems Review of Systems  Respiratory: Negative.   Gastrointestinal: Negative.   Musculoskeletal: Positive for back pain and myalgias. Negative for arthralgias.  Neurological: Negative.      Physical Exam Triage Vital Signs ED Triage Vitals  Enc Vitals Group     BP 05/16/20 0948 (!) 158/96     Pulse Rate 05/16/20 0948 62     Resp 05/16/20 0948 16     Temp 05/16/20 0948 98.1 F (36.7 C)     Temp Source 05/16/20 0948 Oral     SpO2 05/16/20 0948 97 %     Weight 05/16/20 0944 185 lb (83.9 kg)     Height --      Head Circumference --      Peak Flow --      Pain Score 05/16/20 0944 10     Pain Loc --      Pain Edu?  --      Excl. in GC? --    No data found.  Updated Vital Signs BP (!) 158/96 (BP Location: Right Arm)   Pulse 62   Temp 98.1 F (36.7 C) (Oral)   Resp 16   Wt 83.9 kg   SpO2 97%   BMI 27.32 kg/m   Visual Acuity Right Eye Distance:   Left Eye Distance:   Bilateral Distance:    Right Eye Near:   Left Eye Near:    Bilateral Near:     Physical Exam Vitals and nursing note reviewed.  Constitutional:      General: He is not in acute distress.    Appearance: He is not ill-appearing.  Cardiovascular:     Rate and Rhythm: Normal rate and regular rhythm.     Pulses: Normal pulses.     Heart sounds: Normal heart sounds.  Pulmonary:     Effort: Pulmonary effort is normal.     Breath sounds: Normal breath sounds.  Abdominal:     General: Bowel sounds are normal.     Palpations: Abdomen is soft.  Neurological:     General: No focal deficit present.     Mental Status: He is alert and oriented to person, place, and time. Mental status is at baseline.      UC Treatments / Results  Labs (all labs ordered are listed, but only abnormal results are displayed) Labs Reviewed - No data to display  EKG   Radiology No results found.  Procedures Procedures (including critical care time)  Medications Ordered in UC Medications  ketorolac (TORADOL) injection 30 mg (30 mg Intramuscular Given 05/16/20 1023)    Initial Impression / Assessment and Plan / UC Course  I have reviewed the triage vital signs and the nursing notes.  Pertinent labs & imaging results that were available during my care of the patient were reviewed by me and considered in my medical decision making (see chart for details).     1.  Acute left-sided low back pain without sciatica: Ibuprofen as needed for pain management Continue Flexeril use Gentle range of motion exercises Heating pad 10 to 15 minutes at a time with 10 to 15 minutes in between. Return to urgent care if symptoms worsen. Final Clinical  Impressions(s) / UC Diagnoses   Final diagnoses:  Acute left-sided low back pain without sciatica     Discharge Instructions     Follow up at urgent care if symptoms worsen  Use ibuprofen as needed for  pain management as well as muscle relaxer, use heating pad 15 minutes on and off for comfort,  continue to do stretching exercises,   ED Prescriptions    Medication Sig Dispense Auth. Provider   ibuprofen (ADVIL) 600 MG tablet Take 1 tablet (600 mg total) by mouth every 6 (six) hours as needed. 30 tablet Valinda Hoar, NP     PDMP not reviewed this encounter.   Merrilee Jansky, MD 05/19/20 [phone]

## 2020-12-09 ENCOUNTER — Other Ambulatory Visit: Payer: Self-pay | Admitting: Emergency Medicine

## 2020-12-09 DIAGNOSIS — E785 Hyperlipidemia, unspecified: Secondary | ICD-10-CM

## 2020-12-14 ENCOUNTER — Ambulatory Visit: Payer: Medicare Other | Admitting: Emergency Medicine

## 2020-12-15 ENCOUNTER — Encounter: Payer: Self-pay | Admitting: Emergency Medicine

## 2020-12-15 ENCOUNTER — Other Ambulatory Visit: Payer: Self-pay

## 2020-12-15 ENCOUNTER — Ambulatory Visit (INDEPENDENT_AMBULATORY_CARE_PROVIDER_SITE_OTHER): Payer: Medicare Other | Admitting: Emergency Medicine

## 2020-12-15 VITALS — BP 128/82 | HR 52 | Ht 69.0 in | Wt 185.0 lb

## 2020-12-15 DIAGNOSIS — Z8679 Personal history of other diseases of the circulatory system: Secondary | ICD-10-CM | POA: Diagnosis not present

## 2020-12-15 DIAGNOSIS — Z125 Encounter for screening for malignant neoplasm of prostate: Secondary | ICD-10-CM

## 2020-12-15 DIAGNOSIS — Z0000 Encounter for general adult medical examination without abnormal findings: Secondary | ICD-10-CM

## 2020-12-15 DIAGNOSIS — Z1329 Encounter for screening for other suspected endocrine disorder: Secondary | ICD-10-CM | POA: Diagnosis not present

## 2020-12-15 DIAGNOSIS — Z13228 Encounter for screening for other metabolic disorders: Secondary | ICD-10-CM

## 2020-12-15 DIAGNOSIS — E785 Hyperlipidemia, unspecified: Secondary | ICD-10-CM | POA: Diagnosis not present

## 2020-12-15 DIAGNOSIS — Z23 Encounter for immunization: Secondary | ICD-10-CM

## 2020-12-15 DIAGNOSIS — Z130 Encounter for screening for diseases of the blood and blood-forming organs and certain disorders involving the immune mechanism: Secondary | ICD-10-CM

## 2020-12-15 LAB — LIPID PANEL
Cholesterol: 163 mg/dL (ref 0–200)
HDL: 55.8 mg/dL (ref >39.00)
LDL Cholesterol: 92 mg/dL (ref 0–99)
NonHDL: 107.55
Total CHOL/HDL Ratio: 3
Triglycerides: 79 mg/dL (ref 0.0–149.0)
VLDL: 15.8 mg/dL (ref 0.0–40.0)

## 2020-12-15 LAB — COMPREHENSIVE METABOLIC PANEL
ALT: 36 U/L (ref 0–53)
AST: 31 U/L (ref 0–37)
Albumin: 4.3 g/dL (ref 3.5–5.2)
Alkaline Phosphatase: 89 U/L (ref 39–117)
BUN: 29 mg/dL — ABNORMAL HIGH (ref 6–23)
CO2: 30 mEq/L (ref 19–32)
Calcium: 9.7 mg/dL (ref 8.4–10.5)
Chloride: 106 mEq/L (ref 96–112)
Creatinine, Ser: 1.17 mg/dL (ref 0.40–1.50)
GFR: 65.18 mL/min (ref >60.00)
Glucose, Bld: 88 mg/dL (ref 70–99)
Potassium: 4.5 mEq/L (ref 3.5–5.1)
Sodium: 142 mEq/L (ref 135–145)
Total Bilirubin: 0.5 mg/dL (ref 0.2–1.2)
Total Protein: 7.2 g/dL (ref 6.0–8.3)

## 2020-12-15 NOTE — Patient Instructions (Signed)
Health Maintenance, Male Adopting a healthy lifestyle and getting preventive care are important in promoting health and wellness. Ask your health care provider about: The right schedule for you to have regular tests and exams. Things you can do on your own to prevent diseases and keep yourself healthy. What should I know about diet, weight, and exercise? Eat a healthy diet  Eat a diet that includes plenty of vegetables, fruits, low-fat dairy products, and lean protein. Do not eat a lot of foods that are high in solid fats, added sugars, or sodium. Maintain a healthy weight Body mass index (BMI) is a measurement that can be used to identify possible weight problems. It estimates body fat based on height and weight. Your health care provider can help determine your BMI and help you achieve or maintain a healthy weight. Get regular exercise Get regular exercise. This is one of the most important things you can do for your health. Most adults should: Exercise for at least 150 minutes each week. The exercise should increase your heart rate and make you sweat (moderate-intensity exercise). Do strengthening exercises at least twice a week. This is in addition to the moderate-intensity exercise. Spend less time sitting. Even light physical activity can be beneficial. Watch cholesterol and blood lipids Have your blood tested for lipids and cholesterol at 66 years of age, then have this test every 5 years. You may need to have your cholesterol levels checked more often if: Your lipid or cholesterol levels are high. You are older than 66 years of age. You are at high risk for heart disease. What should I know about cancer screening? Many types of cancers can be detected early and may often be prevented. Depending on your health history and family history, you may need to have cancer screening at various ages. This may include screening for: Colorectal cancer. Prostate cancer. Skin cancer. Lung  cancer. What should I know about heart disease, diabetes, and high blood pressure? Blood pressure and heart disease High blood pressure causes heart disease and increases the risk of stroke. This is more likely to develop in people who have high blood pressure readings, are of African descent, or are overweight. Talk with your health care provider about your target blood pressure readings. Have your blood pressure checked: Every 3-5 years if you are 18-39 years of age. Every year if you are 40 years old or older. If you are between the ages of 65 and 75 and are a current or former smoker, ask your health care provider if you should have a one-time screening for abdominal aortic aneurysm (AAA). Diabetes Have regular diabetes screenings. This checks your fasting blood sugar level. Have the screening done: Once every three years after age 45 if you are at a normal weight and have a low risk for diabetes. More often and at a younger age if you are overweight or have a high risk for diabetes. What should I know about preventing infection? Hepatitis B If you have a higher risk for hepatitis B, you should be screened for this virus. Talk with your health care provider to find out if you are at risk for hepatitis B infection. Hepatitis C Blood testing is recommended for: Everyone born from 1945 through 1965. Anyone with known risk factors for hepatitis C. Sexually transmitted infections (STIs) You should be screened each year for STIs, including gonorrhea and chlamydia, if: You are sexually active and are younger than 66 years of age. You are older than 66 years   of age and your health care provider tells you that you are at risk for this type of infection. Your sexual activity has changed since you were last screened, and you are at increased risk for chlamydia or gonorrhea. Ask your health care provider if you are at risk. Ask your health care provider about whether you are at high risk for HIV.  Your health care provider may recommend a prescription medicine to help prevent HIV infection. If you choose to take medicine to prevent HIV, you should first get tested for HIV. You should then be tested every 3 months for as long as you are taking the medicine. Follow these instructions at home: Lifestyle Do not use any products that contain nicotine or tobacco, such as cigarettes, e-cigarettes, and chewing tobacco. If you need help quitting, ask your health care provider. Do not use street drugs. Do not share needles. Ask your health care provider for help if you need support or information about quitting drugs. Alcohol use Do not drink alcohol if your health care provider tells you not to drink. If you drink alcohol: Limit how much you have to 0-2 drinks a day. Be aware of how much alcohol is in your drink. In the [place] one drink equals one 12 oz bottle of beer (355 mL), one 5 oz glass of wine (148 mL), or one 1 oz glass of hard liquor (44 mL). General instructions Schedule regular health, dental, and eye exams. Stay current with your vaccines. Tell your health care provider if: You often feel depressed. You have ever been abused or do not feel safe at home. Summary Adopting a healthy lifestyle and getting preventive care are important in promoting health and wellness. Follow your health care provider's instructions about healthy diet, exercising, and getting tested or screened for diseases. Follow your health care provider's instructions on monitoring your cholesterol and blood pressure. This information is not intended to replace advice given to you by your health care provider. Make sure you discuss any questions you have with your health care provider. Document Revised: 06/10/2020 Document Reviewed: 03/26/2018 Elsevier Patient Education  2022 Elsevier Inc.  

## 2020-12-15 NOTE — Progress Notes (Signed)
Johnny Boyer M Coltrane 65 y.o.   Chief Complaint  Patient presents with   Annual Exam    HISTORY OF PRESENT ILLNESS: This is a 65 y.o. male here for annual exam. Has history of dyslipidemia on atorvastatin 80 mg daily Also takes 1 baby aspirins daily. Has no complaints or medical concerns today.  HPI   Prior to Admission medications   Medication Sig Start Date End Date Taking? Authorizing Provider  aspirin EC 81 MG tablet Take 81 mg by mouth daily.    [provider]  atorvastatin (LIPITOR) 80 MG tablet TAKE 1 TABLET BY MOUTH EVERY DAY 12/10/20   Georgina QuintSagardia, Tailer Volkert Jose, MD    No Known Allergies  Patient Active Problem List   Diagnosis Date Noted   Prolapsed internal hemorrhoids, grade 4, s/p ligation/pexy/hemorrhoidectomy 04/21/2020   History of coronary artery disease 12/15/2019   External hemorrhoids 12/15/2019   Dyslipidemia 12/15/2019    Past Medical History:  Diagnosis Date   Hemorrhoids    Hyperlipidemia    Myocardial infarction (HCC) 2009   Sleep apnea    no cpap used did not tolerate, moderate osa    Past Surgical History:  Procedure Laterality Date   CARDIAC CATHETERIZATION  2009   stents to mid rca x 2   EVALUATION UNDER ANESTHESIA WITH HEMORRHOIDECTOMY N/A 04/21/2020   Procedure: HEMORRHOIDECTOMY X3 WITH LIGATION AND HEMORRHOIDOPEXY ANORECTAL EXAMINATION UNDER ANESTHESIA;  Surgeon: Karie SodaGross, Steven, MD;  [facility] [place];  Service: General;  Laterality: N/A;   FRACTURE SURGERY  1974   right wrist   SPINE SURGERY  2008 and 2009   cervical C 3 C4 C5 fusion    Social History   Socioeconomic History   Marital status: Married    Spouse name: Not on file   Number of children: Not on file   Years of education: Not on file   Highest education level: Not on file  Occupational History   Not on file  Tobacco Use   Smoking status: Never   Smokeless tobacco: Never  Vaping Use   Vaping Use: Never used  Substance and Sexual Activity    Alcohol use: Yes    Comment: 1 or 2 beers per day   Drug use: Never   Sexual activity: Not on file  Other Topics Concern   Not on file  Social History Narrative   Not on file   Social Determinants of Health   Financial Resource Strain: Not on file  Food Insecurity: Not on file  Transportation Needs: Not on file  Physical Activity: Not on file  Stress: Not on file  Social Connections: Not on file  Intimate Partner Violence: Not on file    Family History  Problem Relation Age of Onset   Heart disease Mother    Hyperlipidemia Mother    Hyperlipidemia Sister    Hypertension Sister    Hyperlipidemia Brother    Hypertension Brother      Review of Systems  Constitutional: Negative.  Negative for chills and fever.  HENT: Negative.  Negative for congestion and sore throat.   Respiratory: Negative.  Negative for cough and shortness of breath.   Cardiovascular:  Negative for chest pain and palpitations.  Gastrointestinal:  Negative for abdominal pain, blood in stool, diarrhea, melena, nausea and vomiting.  Genitourinary: Negative.  Negative for dysuria and hematuria.  Skin: Negative.  Negative for rash.  Neurological: Negative.  Negative for dizziness and headaches.  All other systems reviewed and are negative.  Today's  Vitals   12/15/20 0759  Weight: 185 lb (83.9 kg)  Height: 5\' 9"  (1.753 m)   Body mass index is 27.32 kg/m. Wt Readings from Last 3 Encounters:  12/15/20 185 lb (83.9 kg)  05/16/20 185 lb (83.9 kg)  04/21/20 195 lb 12.8 oz (88.8 kg)    Physical Exam Vitals reviewed.  Constitutional:      Appearance: Normal appearance.  HENT:     Head: Normocephalic.     Right Ear: Tympanic membrane, ear canal and external ear normal.     Left Ear: Tympanic membrane, ear canal and external ear normal.     Mouth/Throat:     Mouth: Mucous membranes are moist.     Pharynx: Oropharynx is clear.  Eyes:     Extraocular Movements: Extraocular movements intact.      Conjunctiva/sclera: Conjunctivae normal.     Pupils: Pupils are equal, round, and reactive to light.  Neck:     Vascular: No carotid bruit.  Cardiovascular:     Rate and Rhythm: Normal rate and regular rhythm.     Pulses: Normal pulses.     Heart sounds: Normal heart sounds.  Pulmonary:     Effort: Pulmonary effort is normal.     Breath sounds: Normal breath sounds.  Abdominal:     General: Bowel sounds are normal. There is no distension.     Palpations: Abdomen is soft.     Tenderness: There is no abdominal tenderness.  Musculoskeletal:        General: Normal range of motion.     Cervical back: Normal range of motion and neck supple. No tenderness.  Lymphadenopathy:     Cervical: No cervical adenopathy.  Skin:    General: Skin is warm and dry.     Capillary Refill: Capillary refill takes less than 2 seconds.  Neurological:     General: No focal deficit present.     Mental Status: He is alert and oriented to person, place, and time.  Psychiatric:        Mood and Affect: Mood normal.        Behavior: Behavior normal.     ASSESSMENT & PLAN: Johnny Boyer was seen today for annual exam.  Diagnoses and all orders for this visit:  Routine general medical examination at a health care facility  History of coronary artery disease  Dyslipidemia -     Lipid panel  Prostate cancer screening  Screening for deficiency anemia  Screening for endocrine, metabolic and immunity disorder -     Comprehensive metabolic panel  Need for shingles vaccine -     Varicella-Zoster vaccine SQ  Modifiable risk factors discussed with patient. Anticipatory guidance according to age provided. The following topics were also discussed: Social Determinants of Health Smoking Diet and nutrition Benefits of exercise Cancer screening and colonoscopy recommendation.  Cologuard test was negative. Vaccinations recommendations, shingles in particular. Cardiovascular risk assessment The 10-year ASCVD risk  score Onalee Hua DC Denman George., et al., 2013) is: 14%   Values used to calculate the score:     Age: 16 years     Sex: Male     Is Non-Hispanic African American: No     Diabetic: No     Tobacco smoker: No     Systolic Blood Pressure: 128 mmHg     Is BP treated: Yes     HDL Cholesterol: 56 mg/dL     Total Cholesterol: 181 mg/dL  Mental health including depression and anxiety Fall and accident prevention  Patient Instructions  Health Maintenance, Male Adopting a healthy lifestyle and getting preventive care are important in promoting health and wellness. Ask your health care provider about: The right schedule for you to have regular tests and exams. Things you can do on your own to prevent diseases and keep yourself healthy. What should I know about diet, weight, and exercise? Eat a healthy diet  Eat a diet that includes plenty of vegetables, fruits, low-fat dairy products, and lean protein. Do not eat a lot of foods that are high in solid fats, added sugars, or sodium. Maintain a healthy weight Body mass index (BMI) is a measurement that can be used to identify possible weight problems. It estimates body fat based on height and weight. Your health care provider can help determine your BMI and help you achieve or maintain a healthy weight. Get regular exercise Get regular exercise. This is one of the most important things you can do for your health. Most adults should: Exercise for at least 150 minutes each week. The exercise should increase your heart rate and make you sweat (moderate-intensity exercise). Do strengthening exercises at least twice a week. This is in addition to the moderate-intensity exercise. Spend less time sitting. Even light physical activity can be beneficial. Watch cholesterol and blood lipids Have your blood tested for lipids and cholesterol at 66 years of age, then have this test every 5 years. You may need to have your cholesterol levels checked more often if: Your  lipid or cholesterol levels are high. You are older than 66 years of age. You are at high risk for heart disease. What should I know about cancer screening? Many types of cancers can be detected early and may often be prevented. Depending on your health history and family history, you may need to have cancer screening at various ages. This may include screening for: Colorectal cancer. Prostate cancer. Skin cancer. Lung cancer. What should I know about heart disease, diabetes, and high blood pressure? Blood pressure and heart disease High blood pressure causes heart disease and increases the risk of stroke. This is more likely to develop in people who have high blood pressure readings, are of African descent, or are overweight. Talk with your health care provider about your target blood pressure readings. Have your blood pressure checked: Every 3-5 years if you are 53-68 years of age. Every year if you are 20 years old or older. If you are between the ages of 22 and 63 and are a current or former smoker, ask your health care provider if you should have a one-time screening for abdominal aortic aneurysm (AAA). Diabetes Have regular diabetes screenings. This checks your fasting blood sugar level. Have the screening done: Once every three years after age 70 if you are at a normal weight and have a low risk for diabetes. More often and at a younger age if you are overweight or have a high risk for diabetes. What should I know about preventing infection? Hepatitis B If you have a higher risk for hepatitis B, you should be screened for this virus. Talk with your health care provider to find out if you are at risk for hepatitis B infection. Hepatitis C Blood testing is recommended for: Everyone born from [birth year] through 1965. Anyone with known risk factors for hepatitis C. Sexually transmitted infections (STIs) You should be screened each year for STIs, including gonorrhea and chlamydia, if: You  are sexually active and are younger than 66 years of age. You are  older than 66 years of age and your health care provider tells you that you are at risk for this type of infection. Your sexual activity has changed since you were last screened, and you are at increased risk for chlamydia or gonorrhea. Ask your health care provider if you are at risk. Ask your health care provider about whether you are at high risk for HIV. Your health care provider may recommend a prescription medicine to help prevent HIV infection. If you choose to take medicine to prevent HIV, you should first get tested for HIV. You should then be tested every 3 months for as long as you are taking the medicine. Follow these instructions at home: Lifestyle Do not use any products that contain nicotine or tobacco, such as cigarettes, e-cigarettes, and chewing tobacco. If you need help quitting, ask your health care provider. Do not use street drugs. Do not share needles. Ask your health care provider for help if you need support or information about quitting drugs. Alcohol use Do not drink alcohol if your health care provider tells you not to drink. If you drink alcohol: Limit how much you have to 0-2 drinks a day. Be aware of how much alcohol is in your drink. In the [place] one drink equals one 12 oz bottle of beer (355 mL), one 5 oz glass of wine (148 mL), or one 1 oz glass of hard liquor (44 mL). General instructions Schedule regular health, dental, and eye exams. Stay current with your vaccines. Tell your health care provider if: You often feel depressed. You have ever been abused or do not feel safe at home. Summary Adopting a healthy lifestyle and getting preventive care are important in promoting health and wellness. Follow your health care provider's instructions about healthy diet, exercising, and getting tested or screened for diseases. Follow your health care provider's instructions on monitoring your cholesterol  and blood pressure. This information is not intended to replace advice given to you by your health care provider. Make sure you discuss any questions you have with your health care provider. Document Revised: 06/10/2020 Document Reviewed: 03/26/2018 Elsevier Patient Education  2022 Elsevier Inc.   Edwina Barth, MD [facility]

## 2020-12-19 ENCOUNTER — Encounter: Payer: Self-pay | Admitting: Emergency Medicine

## 2020-12-21 NOTE — Telephone Encounter (Signed)
He should contact our office and we will prescribe medication accordingly.  Thanks.

## 2020-12-23 NOTE — Telephone Encounter (Signed)
Sent pt mychart message with Dr. Latrelle Dodrill recommendations.

## 2021-02-13 IMAGING — DX DG WRIST COMPLETE 3+V*R*
4 series · 4 of 4 positions shown · non-contrast
Comparison: None.

CLINICAL DATA: Acute right wrist pain after injury 2 days ago.

EXAM:
RIGHT WRIST - COMPLETE 3+ VIEW

[wrist pa]
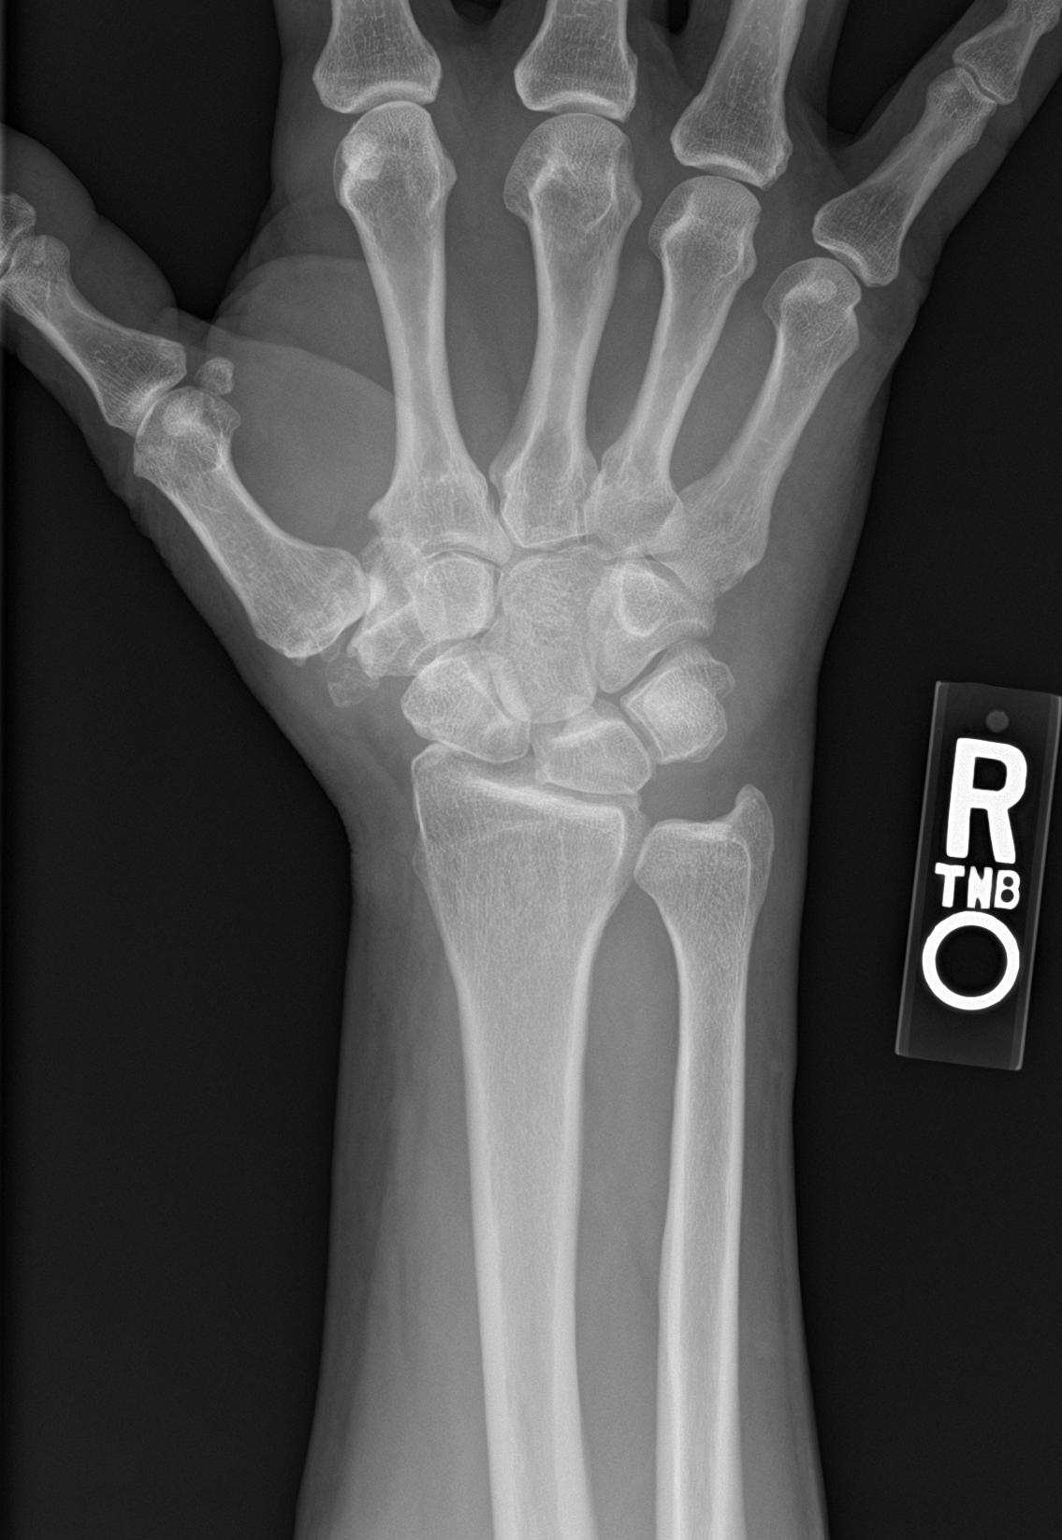

[wrist navicular]
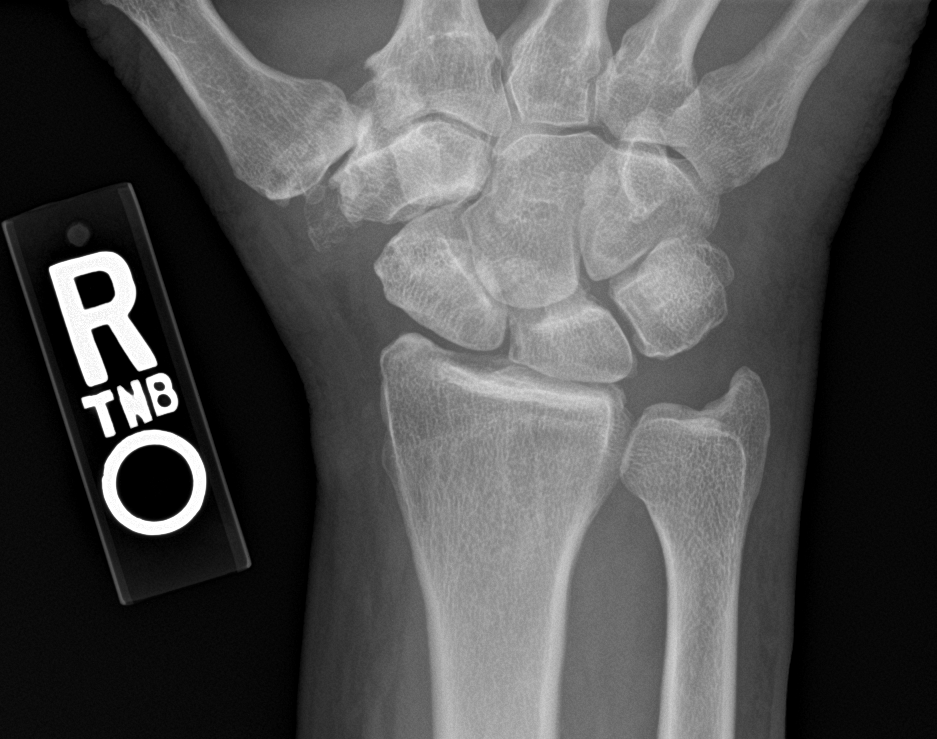

[wrist obl]
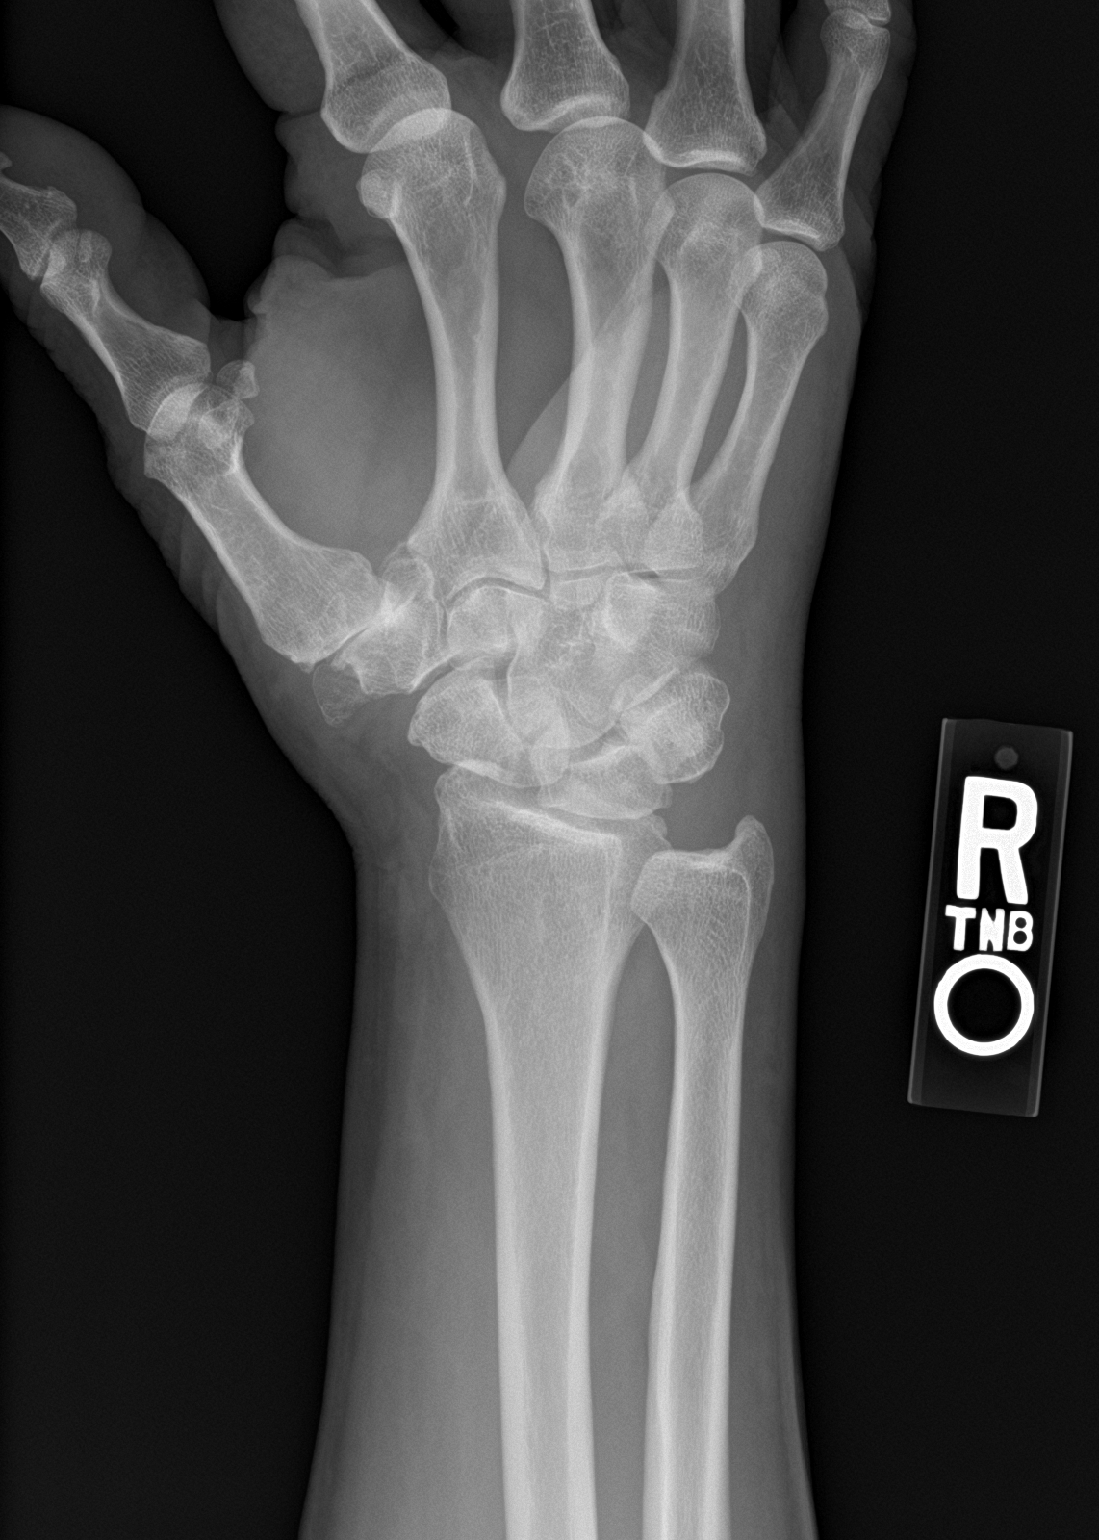

[wrist lat]
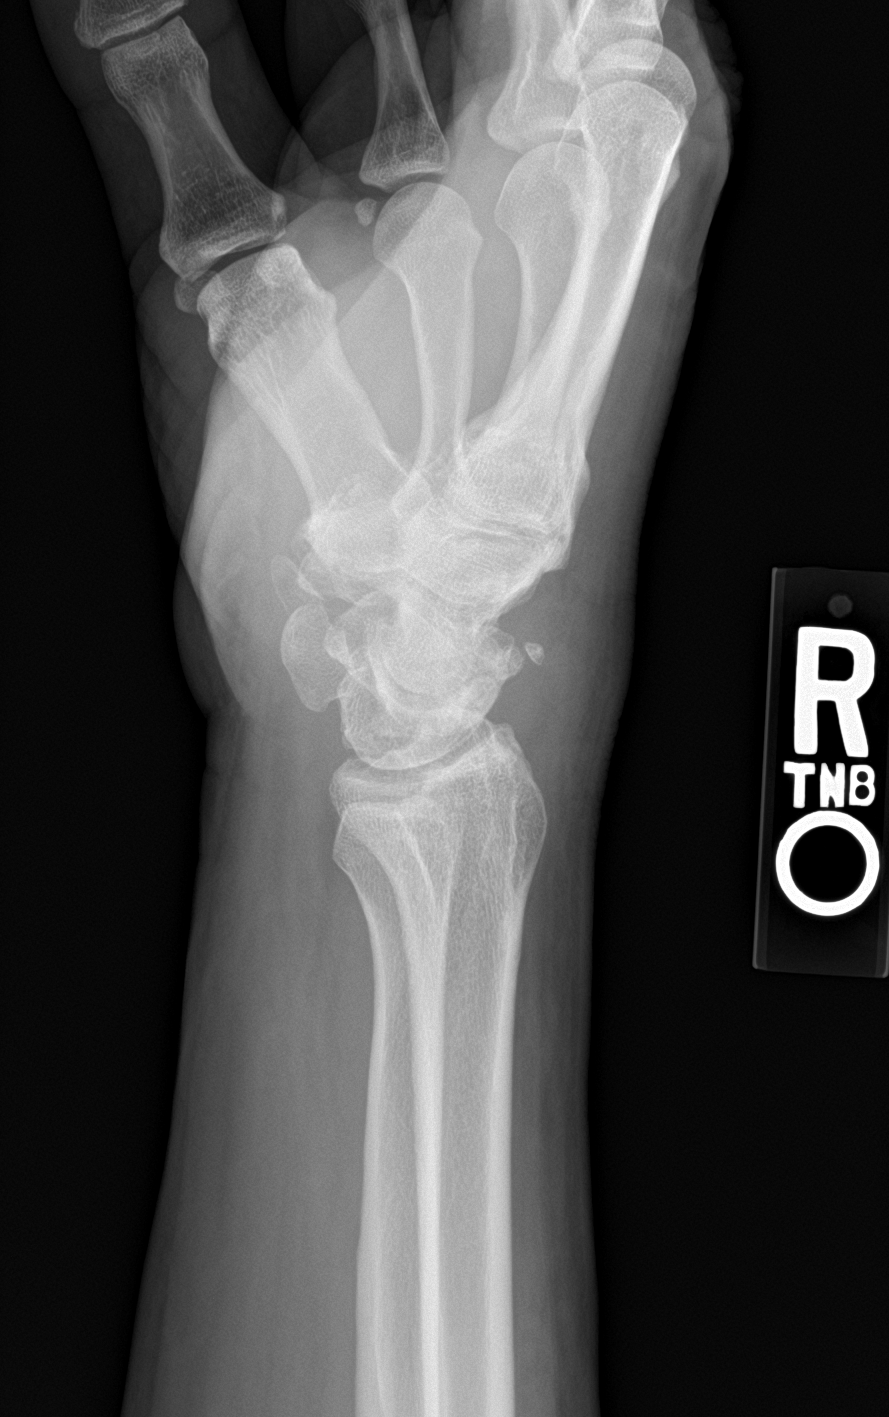

[4 of 4 positions shown; findings below may reference images not displayed]

FINDINGS: There is no evidence of fracture or dislocation. Moderate
degenerative changes seen involving the first carpometacarpal joint.
Soft tissues are unremarkable.
IMPRESSION: Moderate osteoarthritis of the first carpometacarpal joint. No acute
abnormality seen in the right wrist.

## 2021-03-14 ENCOUNTER — Telehealth: Payer: Self-pay | Admitting: Emergency Medicine

## 2021-03-14 NOTE — Telephone Encounter (Signed)
Patient was in for 1 year annual visit 12/15/20   Patient says he received bill for $325 for this visit  Patient says she spoke w/ medicare & they advised him that the visit was coded wrong  Please look into this & update patient [phone]

## 2021-03-17 ENCOUNTER — Ambulatory Visit: Payer: Medicare Other

## 2021-04-19 ENCOUNTER — Encounter: Payer: Self-pay | Admitting: Emergency Medicine

## 2021-04-20 ENCOUNTER — Telehealth: Payer: Self-pay | Admitting: Emergency Medicine

## 2021-04-20 ENCOUNTER — Encounter: Payer: Self-pay | Admitting: Emergency Medicine

## 2021-04-20 ENCOUNTER — Other Ambulatory Visit: Payer: Self-pay | Admitting: Emergency Medicine

## 2021-04-20 DIAGNOSIS — U071 COVID-19: Secondary | ICD-10-CM

## 2021-04-20 MED ORDER — NIRMATRELVIR/RITONAVIR (PAXLOVID)TABLET: 3.0000 | ORAL_TABLET | Freq: Two times a day (BID) | ORAL | 0 refills | Status: AC

## 2021-04-20 NOTE — Telephone Encounter (Signed)
Connected to [facility] 1.4.2023.   Caller states he has a sore throat, congestion, and chills. Tested positive for COVID today. No fever.

## 2021-04-20 NOTE — Telephone Encounter (Signed)
Recommend to start Paxlovid.  Prescription sent to pharmacy of record.

## 2021-04-20 NOTE — Progress Notes (Signed)
Tested positive for COVID today. May benefit from antiviral therapy.

## 2021-04-24 ENCOUNTER — Encounter: Payer: Self-pay | Admitting: Emergency Medicine

## 2021-04-24 NOTE — Telephone Encounter (Signed)
Thanks

## 2021-04-24 NOTE — Telephone Encounter (Signed)
Called and spoke with pt, he states he is feeling better.

## 2021-04-24 NOTE — Telephone Encounter (Signed)
Route to another department.

## 2021-07-03 DIAGNOSIS — H524 Presbyopia: Secondary | ICD-10-CM | POA: Diagnosis not present

## 2021-07-03 DIAGNOSIS — Z0100 Encounter for examination of eyes and vision without abnormal findings: Secondary | ICD-10-CM | POA: Diagnosis not present

## 2021-07-21 ENCOUNTER — Encounter: Payer: Self-pay | Admitting: Emergency Medicine

## 2021-08-17 DIAGNOSIS — Z0100 Encounter for examination of eyes and vision without abnormal findings: Secondary | ICD-10-CM | POA: Diagnosis not present

## 2021-12-02 ENCOUNTER — Other Ambulatory Visit: Payer: Self-pay | Admitting: Emergency Medicine

## 2021-12-02 DIAGNOSIS — E785 Hyperlipidemia, unspecified: Secondary | ICD-10-CM

## 2021-12-19 ENCOUNTER — Encounter: Payer: Self-pay | Admitting: Emergency Medicine

## 2021-12-19 ENCOUNTER — Ambulatory Visit (INDEPENDENT_AMBULATORY_CARE_PROVIDER_SITE_OTHER): Payer: Medicare HMO | Admitting: Emergency Medicine

## 2021-12-19 VITALS — BP 110/72 | HR 53 | Temp 98.1°F | Ht 69.0 in | Wt 197.2 lb

## 2021-12-19 DIAGNOSIS — Z8679 Personal history of other diseases of the circulatory system: Secondary | ICD-10-CM | POA: Diagnosis not present

## 2021-12-19 DIAGNOSIS — E785 Hyperlipidemia, unspecified: Secondary | ICD-10-CM

## 2021-12-19 DIAGNOSIS — Z1329 Encounter for screening for other suspected endocrine disorder: Secondary | ICD-10-CM | POA: Diagnosis not present

## 2021-12-19 DIAGNOSIS — Z130 Encounter for screening for diseases of the blood and blood-forming organs and certain disorders involving the immune mechanism: Secondary | ICD-10-CM | POA: Diagnosis not present

## 2021-12-19 DIAGNOSIS — Z13220 Encounter for screening for lipoid disorders: Secondary | ICD-10-CM

## 2021-12-19 DIAGNOSIS — Z13228 Encounter for screening for other metabolic disorders: Secondary | ICD-10-CM

## 2021-12-19 DIAGNOSIS — Z0000 Encounter for general adult medical examination without abnormal findings: Secondary | ICD-10-CM | POA: Diagnosis not present

## 2021-12-19 LAB — COMPREHENSIVE METABOLIC PANEL
ALT: 27 U/L (ref 0–53)
AST: 24 U/L (ref 0–37)
Albumin: 4.3 g/dL (ref 3.5–5.2)
Alkaline Phosphatase: 91 U/L (ref 39–117)
BUN: 23 mg/dL (ref 6–23)
CO2: 29 mEq/L (ref 19–32)
Calcium: 9.8 mg/dL (ref 8.4–10.5)
Chloride: 108 mEq/L (ref 96–112)
Creatinine, Ser: 1.2 mg/dL (ref 0.40–1.50)
GFR: 62.78 mL/min (ref >60.00)
Glucose, Bld: 74 mg/dL (ref 70–99)
Potassium: 4.4 mEq/L (ref 3.5–5.1)
Sodium: 143 mEq/L (ref 135–145)
Total Bilirubin: 0.5 mg/dL (ref 0.2–1.2)
Total Protein: 7.2 g/dL (ref 6.0–8.3)

## 2021-12-19 LAB — CBC WITH DIFFERENTIAL/PLATELET
Basophils Absolute: 0.1 10*3/uL (ref 0.0–0.1)
Basophils Relative: 0.9 % (ref 0.0–3.0)
Eosinophils Absolute: 0.1 10*3/uL (ref 0.0–0.7)
Eosinophils Relative: 0.9 % (ref 0.0–5.0)
HCT: 41.3 % (ref 39.0–52.0)
Hemoglobin: 14.4 g/dL (ref 13.0–17.0)
Lymphocytes Relative: 23.6 % (ref 12.0–46.0)
Lymphs Abs: 1.7 10*3/uL (ref 0.7–4.0)
MCHC: 34.9 g/dL (ref 30.0–36.0)
MCV: 90.7 fl (ref 78.0–100.0)
Monocytes Absolute: 0.6 10*3/uL (ref 0.1–1.0)
Monocytes Relative: 8.1 % (ref 3.0–12.0)
Neutro Abs: 4.9 10*3/uL (ref 1.4–7.7)
Neutrophils Relative %: 66.5 % (ref 43.0–77.0)
Platelets: 169 10*3/uL (ref 150.0–400.0)
RBC: 4.56 Mil/uL (ref 4.22–5.81)
RDW: 13.3 % (ref 11.5–15.5)
WBC: 7.4 10*3/uL (ref 4.0–10.5)

## 2021-12-19 LAB — LIPID PANEL
Cholesterol: 176 mg/dL (ref 0–200)
HDL: 59 mg/dL (ref >39.00)
LDL Cholesterol: 99 mg/dL (ref 0–99)
NonHDL: 116.55
Total CHOL/HDL Ratio: 3
Triglycerides: 89 mg/dL (ref 0.0–149.0)
VLDL: 17.8 mg/dL (ref 0.0–40.0)

## 2021-12-19 LAB — HEMOGLOBIN A1C: Hgb A1c MFr Bld: 5.9 % (ref 4.6–6.5)

## 2021-12-19 NOTE — Progress Notes (Signed)
Johnny Boyer 66 y.o.   Chief Complaint  Patient presents with   Annual Exam    No concerns     HISTORY OF PRESENT ILLNESS: This is a 66 y.o. male here for annual exam. Has no complaints or medical concerns today. History of dyslipidemia. History of coronary artery disease.  Status post MI 2009 with 2 stents placed. Exercises regularly.  No anginal episodes. Overall doing very well.  HPI   Prior to Admission medications   Medication Sig Start Date End Date Taking? Authorizing Provider  aspirin EC 81 MG tablet Take 81 mg by mouth daily.    [provider]  atorvastatin (LIPITOR) 80 MG tablet TAKE 1 TABLET BY MOUTH EVERY DAY 12/02/21   Horald Pollen, MD    No Known Allergies  Patient Active Problem List   Diagnosis Date Noted   Prolapsed internal hemorrhoids, grade 4, s/p ligation/pexy/hemorrhoidectomy 04/21/2020   History of coronary artery disease 12/15/2019   External hemorrhoids 12/15/2019   Dyslipidemia 12/15/2019    Past Medical History:  Diagnosis Date   Hemorrhoids    Hyperlipidemia    Myocardial infarction ([place]) 2009   Sleep apnea    no cpap used did not tolerate, moderate osa    Past Surgical History:  Procedure Laterality Date   CARDIAC CATHETERIZATION  2009   stents to mid rca x 2   EVALUATION UNDER ANESTHESIA WITH HEMORRHOIDECTOMY N/A 04/21/2020   Procedure: HEMORRHOIDECTOMY X3 WITH LIGATION AND HEMORRHOIDOPEXY ANORECTAL EXAMINATION UNDER ANESTHESIA;  Surgeon: Michael Boston, MD;  Location: [facility];  Service: General;  Laterality: N/A;   FRACTURE SURGERY  1974   right wrist   SPINE SURGERY  2008 and 2009   cervical C 3 C4 C5 fusion    Social History   Socioeconomic History   Marital status: Married    Spouse name: Not on file   Number of children: Not on file   Years of education: Not on file   Highest education level: Not on file  Occupational History   Not on file  Tobacco Use   Smoking status: Never    Smokeless tobacco: Never  Vaping Use   Vaping Use: Never used  Substance and Sexual Activity   Alcohol use: Yes    Comment: 1 or 2 beers per day   Drug use: Never   Sexual activity: Not on file  Other Topics Concern   Not on file  Social History Narrative   Not on file   Social Determinants of Health   Financial Resource Strain: Not on file  Food Insecurity: Not on file  Transportation Needs: Not on file  Physical Activity: Not on file  Stress: Not on file  Social Connections: Not on file  Intimate Partner Violence: Not on file    Family History  Problem Relation Age of Onset   Heart disease Mother    Hyperlipidemia Mother    Hyperlipidemia Sister    Hypertension Sister    Hyperlipidemia Brother    Hypertension Brother      Review of Systems  Constitutional: Negative.  Negative for chills and fever.  HENT:  Positive for nosebleeds. Negative for sore throat.   Respiratory: Negative.  Negative for cough and shortness of breath.   Cardiovascular: Negative.  Negative for chest pain and palpitations.  Gastrointestinal:  Negative for abdominal pain, diarrhea, nausea and vomiting.  Genitourinary: Negative.   Skin:  Negative for rash.  Neurological:  Negative for dizziness and headaches.  All  other systems reviewed and are negative.  Today's Vitals   12/19/21 0808  BP: 110/72  Pulse: (!) 53  Temp: 98.1 F (36.7 C)  TempSrc: Oral  SpO2: 96%  Weight: 197 lb 4 oz (89.5 kg)  Height: 5\' 9"  (1.753 m)   Body mass index is 29.13 kg/m. Wt Readings from Last 3 Encounters:  12/19/21 197 lb 4 oz (89.5 kg)  12/15/20 185 lb (83.9 kg)  05/16/20 185 lb (83.9 kg)     Physical Exam Vitals reviewed.  Constitutional:      Appearance: Normal appearance.  HENT:     Head: Normocephalic.     Right Ear: Tympanic membrane, ear canal and external ear normal.     Left Ear: Tympanic membrane, ear canal and external ear normal.     Mouth/Throat:     Mouth: Mucous membranes are  moist.     Pharynx: Oropharynx is clear.  Eyes:     Extraocular Movements: Extraocular movements intact.     Conjunctiva/sclera: Conjunctivae normal.     Pupils: Pupils are equal, round, and reactive to light.  Cardiovascular:     Rate and Rhythm: Normal rate and regular rhythm.     Pulses: Normal pulses.     Heart sounds: Normal heart sounds.  Pulmonary:     Effort: Pulmonary effort is normal.     Breath sounds: Normal breath sounds.  Abdominal:     General: There is no distension.     Palpations: Abdomen is soft.     Tenderness: There is no abdominal tenderness.  Musculoskeletal:     Cervical back: No tenderness.     Right lower leg: No edema.     Left lower leg: No edema.  Lymphadenopathy:     Cervical: No cervical adenopathy.  Skin:    General: Skin is warm and dry.     Capillary Refill: Capillary refill takes less than 2 seconds.  Neurological:     General: No focal deficit present.     Mental Status: He is alert and oriented to person, place, and time.  Psychiatric:        Mood and Affect: Mood normal.        Behavior: Behavior normal.      ASSESSMENT & PLAN: Problem List Items Addressed This Visit       Other   History of coronary artery disease   Dyslipidemia   Relevant Orders   Lipid panel   Other Visit Diagnoses     Routine general medical examination at a health care facility    -  Primary   Screening for deficiency anemia       Relevant Orders   CBC with Differential   Screening for lipoid disorders       Relevant Orders   Lipid panel   Screening for endocrine, metabolic and immunity disorder       Relevant Orders   Comprehensive metabolic panel   Hemoglobin A1c      Modifiable risk factors discussed with patient. Anticipatory guidance according to age provided. The following topics were also discussed: Social Determinants of Health Smoking.  Non-smoker Diet and nutrition.  Good eating habits Benefits of exercise.  Exercises  regularly Cancer screening and review of most recent Cologuard result Vaccinations recommendations Cardiovascular risk assessment The 10-year ASCVD risk score (Arnett DK, et al., 2019) is: 9.5%   Values used to calculate the score:     Age: 71 years     Sex: Male  Is Non-Hispanic African American: No     Diabetic: No     Tobacco smoker: No     Systolic Blood Pressure: [id number] mmHg     Is BP treated: No     HDL Cholesterol: 55.8 mg/dL     Total Cholesterol: 163 mg/dL Management of recurrent epistaxis episodes. Mental health including depression and anxiety Fall and accident prevention  Patient Instructions  Health Maintenance, Male Adopting a healthy lifestyle and getting preventive care are important in promoting health and wellness. Ask your health care provider about: The right schedule for you to have regular tests and exams. Things you can do on your own to prevent diseases and keep yourself healthy. What should I know about diet, weight, and exercise? Eat a healthy diet  Eat a diet that includes plenty of vegetables, fruits, low-fat dairy products, and lean protein. Do not eat a lot of foods that are high in solid fats, added sugars, or sodium. Maintain a healthy weight Body mass index (BMI) is a measurement that can be used to identify possible weight problems. It estimates body fat based on height and weight. Your health care provider can help determine your BMI and help you achieve or maintain a healthy weight. Get regular exercise Get regular exercise. This is one of the most important things you can do for your health. Most adults should: Exercise for at least 150 minutes each week. The exercise should increase your heart rate and make you sweat (moderate-intensity exercise). Do strengthening exercises at least twice a week. This is in addition to the moderate-intensity exercise. Spend less time sitting. Even light physical activity can be beneficial. Watch cholesterol  and blood lipids Have your blood tested for lipids and cholesterol at 67 years of age, then have this test every 5 years. You may need to have your cholesterol levels checked more often if: Your lipid or cholesterol levels are high. You are older than 67 years of age. You are at high risk for heart disease. What should I know about cancer screening? Many types of cancers can be detected early and may often be prevented. Depending on your health history and family history, you may need to have cancer screening at various ages. This may include screening for: Colorectal cancer. Prostate cancer. Skin cancer. Lung cancer. What should I know about heart disease, diabetes, and high blood pressure? Blood pressure and heart disease High blood pressure causes heart disease and increases the risk of stroke. This is more likely to develop in people who have high blood pressure readings or are overweight. Talk with your health care provider about your target blood pressure readings. Have your blood pressure checked: Every 3-5 years if you are 26-68 years of age. Every year if you are 61 years old or older. If you are between the ages of 50 and 71 and are a current or former smoker, ask your health care provider if you should have a one-time screening for abdominal aortic aneurysm (AAA). Diabetes Have regular diabetes screenings. This checks your fasting blood sugar level. Have the screening done: Once every three years after age 55 if you are at a normal weight and have a low risk for diabetes. More often and at a younger age if you are overweight or have a high risk for diabetes. What should I know about preventing infection? Hepatitis B If you have a higher risk for hepatitis B, you should be screened for this virus. Talk with your health care provider to find  out if you are at risk for hepatitis B infection. Hepatitis C Blood testing is recommended for: Everyone born from [birth year] through  1965. Anyone with known risk factors for hepatitis C. Sexually transmitted infections (STIs) You should be screened each year for STIs, including gonorrhea and chlamydia, if: You are sexually active and are younger than 67 years of age. You are older than 67 years of age and your health care provider tells you that you are at risk for this type of infection. Your sexual activity has changed since you were last screened, and you are at increased risk for chlamydia or gonorrhea. Ask your health care provider if you are at risk. Ask your health care provider about whether you are at high risk for HIV. Your health care provider may recommend a prescription medicine to help prevent HIV infection. If you choose to take medicine to prevent HIV, you should first get tested for HIV. You should then be tested every 3 months for as long as you are taking the medicine. Follow these instructions at home: Alcohol use Do not drink alcohol if your health care provider tells you not to drink. If you drink alcohol: Limit how much you have to 0-2 drinks a day. Know how much alcohol is in your drink. In the [place] one drink equals one 12 oz bottle of beer (355 mL), one 5 oz glass of wine (148 mL), or one 1 oz glass of hard liquor (44 mL). Lifestyle Do not use any products that contain nicotine or tobacco. These products include cigarettes, chewing tobacco, and vaping devices, such as e-cigarettes. If you need help quitting, ask your health care provider. Do not use street drugs. Do not share needles. Ask your health care provider for help if you need support or information about quitting drugs. General instructions Schedule regular health, dental, and eye exams. Stay current with your vaccines. Tell your health care provider if: You often feel depressed. You have ever been abused or do not feel safe at home. Summary Adopting a healthy lifestyle and getting preventive care are important in promoting health and  wellness. Follow your health care provider's instructions about healthy diet, exercising, and getting tested or screened for diseases. Follow your health care provider's instructions on monitoring your cholesterol and blood pressure. This information is not intended to replace advice given to you by your health care provider. Make sure you discuss any questions you have with your health care provider. Document Revised: 08/22/2020 Document Reviewed: 08/22/2020 Elsevier Patient Education  2023 Elsevier Inc.     Edwina Barth, MD [facility]

## 2021-12-19 NOTE — Patient Instructions (Signed)
Health Maintenance, Male Adopting a healthy lifestyle and getting preventive care are important in promoting health and wellness. Ask your health care provider about: The right schedule for you to have regular tests and exams. Things you can do on your own to prevent diseases and keep yourself healthy. What should I know about diet, weight, and exercise? Eat a healthy diet  Eat a diet that includes plenty of vegetables, fruits, low-fat dairy products, and lean protein. Do not eat a lot of foods that are high in solid fats, added sugars, or sodium. Maintain a healthy weight Body mass index (BMI) is a measurement that can be used to identify possible weight problems. It estimates body fat based on height and weight. Your health care provider can help determine your BMI and help you achieve or maintain a healthy weight. Get regular exercise Get regular exercise. This is one of the most important things you can do for your health. Most adults should: Exercise for at least 150 minutes each week. The exercise should increase your heart rate and make you sweat (moderate-intensity exercise). Do strengthening exercises at least twice a week. This is in addition to the moderate-intensity exercise. Spend less time sitting. Even light physical activity can be beneficial. Watch cholesterol and blood lipids Have your blood tested for lipids and cholesterol at 67 years of age, then have this test every 5 years. You may need to have your cholesterol levels checked more often if: Your lipid or cholesterol levels are high. You are older than 67 years of age. You are at high risk for heart disease. What should I know about cancer screening? Many types of cancers can be detected early and may often be prevented. Depending on your health history and family history, you may need to have cancer screening at various ages. This may include screening for: Colorectal cancer. Prostate cancer. Skin cancer. Lung  cancer. What should I know about heart disease, diabetes, and high blood pressure? Blood pressure and heart disease High blood pressure causes heart disease and increases the risk of stroke. This is more likely to develop in people who have high blood pressure readings or are overweight. Talk with your health care provider about your target blood pressure readings. Have your blood pressure checked: Every 3-5 years if you are 18-39 years of age. Every year if you are 40 years old or older. If you are between the ages of 65 and 75 and are a current or former smoker, ask your health care provider if you should have a one-time screening for abdominal aortic aneurysm (AAA). Diabetes Have regular diabetes screenings. This checks your fasting blood sugar level. Have the screening done: Once every three years after age 45 if you are at a normal weight and have a low risk for diabetes. More often and at a younger age if you are overweight or have a high risk for diabetes. What should I know about preventing infection? Hepatitis B If you have a higher risk for hepatitis B, you should be screened for this virus. Talk with your health care provider to find out if you are at risk for hepatitis B infection. Hepatitis C Blood testing is recommended for: Everyone born from 1945 through 1965. Anyone with known risk factors for hepatitis C. Sexually transmitted infections (STIs) You should be screened each year for STIs, including gonorrhea and chlamydia, if: You are sexually active and are younger than 67 years of age. You are older than 67 years of age and your   health care provider tells you that you are at risk for this type of infection. Your sexual activity has changed since you were last screened, and you are at increased risk for chlamydia or gonorrhea. Ask your health care provider if you are at risk. Ask your health care provider about whether you are at high risk for HIV. Your health care provider  may recommend a prescription medicine to help prevent HIV infection. If you choose to take medicine to prevent HIV, you should first get tested for HIV. You should then be tested every 3 months for as long as you are taking the medicine. Follow these instructions at home: Alcohol use Do not drink alcohol if your health care provider tells you not to drink. If you drink alcohol: Limit how much you have to 0-2 drinks a day. Know how much alcohol is in your drink. In the [place] one drink equals one 12 oz bottle of beer (355 mL), one 5 oz glass of wine (148 mL), or one 1 oz glass of hard liquor (44 mL). Lifestyle Do not use any products that contain nicotine or tobacco. These products include cigarettes, chewing tobacco, and vaping devices, such as e-cigarettes. If you need help quitting, ask your health care provider. Do not use street drugs. Do not share needles. Ask your health care provider for help if you need support or information about quitting drugs. General instructions Schedule regular health, dental, and eye exams. Stay current with your vaccines. Tell your health care provider if: You often feel depressed. You have ever been abused or do not feel safe at home. Summary Adopting a healthy lifestyle and getting preventive care are important in promoting health and wellness. Follow your health care provider's instructions about healthy diet, exercising, and getting tested or screened for diseases. Follow your health care provider's instructions on monitoring your cholesterol and blood pressure. This information is not intended to replace advice given to you by your health care provider. Make sure you discuss any questions you have with your health care provider. Document Revised: 08/22/2020 Document Reviewed: 08/22/2020 Elsevier Patient Education  2023 Elsevier Inc.  

## 2022-03-30 ENCOUNTER — Telehealth: Payer: Self-pay | Admitting: Emergency Medicine

## 2022-03-30 NOTE — Telephone Encounter (Signed)
Patient didn't want to schedule

## 2022-03-30 NOTE — Telephone Encounter (Signed)
Left message for patient to call back to schedule Medicare Annual Wellness Visit  ? ?No hx of AWV eligible as of 11/14/20 ? ?Please schedule at anytime with [facility] if patient calls the office back.   ? ? ?Any questions, please call me at [phone]  ?

## 2022-04-27 DIAGNOSIS — R69 Illness, unspecified: Secondary | ICD-10-CM | POA: Diagnosis not present

## 2022-05-31 ENCOUNTER — Other Ambulatory Visit: Payer: Self-pay | Admitting: Emergency Medicine

## 2022-05-31 DIAGNOSIS — E785 Hyperlipidemia, unspecified: Secondary | ICD-10-CM

## 2022-07-23 ENCOUNTER — Encounter: Payer: Self-pay | Admitting: Emergency Medicine

## 2022-07-23 ENCOUNTER — Telehealth (INDEPENDENT_AMBULATORY_CARE_PROVIDER_SITE_OTHER): Payer: Medicare HMO | Admitting: Emergency Medicine

## 2022-07-23 DIAGNOSIS — M25552 Pain in left hip: Secondary | ICD-10-CM | POA: Diagnosis not present

## 2022-07-23 MED ORDER — TRAMADOL HCL 50 MG PO TABS: 50.0000 mg | ORAL_TABLET | Freq: Three times a day (TID) | ORAL | 0 refills | Status: AC | PRN

## 2022-07-23 NOTE — Assessment & Plan Note (Signed)
Differential diagnosis discussed Needs office visit for evaluation Recommend orthopedic evaluation Referral placed today Will need x-rays Pain management discussed Advised to take Tylenol and or Advil for mild to moderate pain and tramadol for moderate to severe pain

## 2022-07-23 NOTE — Progress Notes (Signed)
Telemedicine Encounter- SOAP NOTE Established Patient MyChart video conference Patient: Home  Provider: Office   Patient present only  This video encounter was conducted with the patient's (or proxy's) verbal consent via video telecommunications: yes/no: Yes Patient was instructed to have this encounter in a suitably private space; and to only have persons present to whom they give permission to participate. In addition, patient identity was confirmed by use of name plus two identifiers (DOB and address).  I discussed the limitations, risks, security and privacy concerns of performing an evaluation and management service by telephone and the availability of in person appointments. I also discussed with the patient that there may be a patient responsible charge related to this service. The patient expressed understanding and agreed to proceed.   Chief complaint: Left hip pain  Subjective   Johnny Boyer is a 67 y.o. male established patient. Telephone visit today complaining of left hip pain that started about 2 to 3 weeks ago.  Denies any injuries. Sharp pain with movement.  Better with rest. No associated symptoms No other complaints or medical concerns today  HPI   Patient Active Problem List   Diagnosis Date Noted   Prolapsed internal hemorrhoids, grade 4, s/p ligation/pexy/hemorrhoidectomy 04/21/2020   History of coronary artery disease 12/15/2019   External hemorrhoids 12/15/2019   Dyslipidemia 12/15/2019    Past Medical History:  Diagnosis Date   Hemorrhoids    Hyperlipidemia    Myocardial infarction [facility]) 2009   Sleep apnea    no cpap used did not tolerate, moderate osa    Current Outpatient Medications  Medication Sig Dispense Refill   aspirin EC 81 MG tablet Take 81 mg by mouth daily.     atorvastatin (LIPITOR) 80 MG tablet TAKE 1 TABLET BY MOUTH EVERY DAY 90 tablet 2   No current facility-administered medications for this visit.    No Known  Allergies  Social History   Socioeconomic History   Marital status: Married    Spouse name: Not on file   Number of children: Not on file   Years of education: Not on file   Highest education level: Not on file  Occupational History   Not on file  Tobacco Use   Smoking status: Never   Smokeless tobacco: Never  Vaping Use   Vaping Use: Never used  Substance and Sexual Activity   Alcohol use: Yes    Comment: 1 or 2 beers per day   Drug use: Never   Sexual activity: Not on file  Other Topics Concern   Not on file  Social History Narrative   Not on file   Social Determinants of Health   Financial Resource Strain: Not on file  Food Insecurity: Not on file  Transportation Needs: Not on file  Physical Activity: Not on file  Stress: Not on file  Social Connections: Not on file  Intimate Partner Violence: Not on file    Review of Systems  Constitutional: Negative.  Negative for chills and fever.  HENT: Negative.  Negative for congestion and sore throat.   Respiratory: Negative.  Negative for cough and shortness of breath.   Cardiovascular: Negative.  Negative for chest pain and palpitations.  Gastrointestinal:  Negative for abdominal pain, diarrhea, nausea and vomiting.  Genitourinary: Negative.  Negative for dysuria and hematuria.  Musculoskeletal:  Positive for joint pain (Left hip pain).  Skin: Negative.  Negative for rash.  Neurological: Negative.  Negative for dizziness, tingling, sensory change, focal weakness and  headaches.  All other systems reviewed and are negative.   Objective  Alert and oriented x 3 in no apparent respiratory distress Vitals as reported by the patient: There were no vitals filed for this visit.  Problem List Items Addressed This Visit       Other   Left hip pain - Primary    Differential diagnosis discussed Needs office visit for evaluation Recommend orthopedic evaluation Referral placed today Will need x-rays Pain management  discussed Advised to take Tylenol and or Advil for mild to moderate pain and tramadol for moderate to severe pain      Relevant Medications   traMADol (ULTRAM) 50 MG tablet   Other Relevant Orders   Ambulatory referral to Sports Medicine      I discussed the assessment and treatment plan with the patient. The patient was provided an opportunity to ask questions and all were answered. The patient agreed with the plan and demonstrated an understanding of the instructions.   The patient was advised to call back or seek an in-person evaluation if the symptoms worsen or if the condition fails to improve as anticipated.   Georgina Quint, MD  Primary Care at [facility]

## 2022-07-26 NOTE — Progress Notes (Signed)
IStevenson Clinch, CMA acting as a Neurosurgeon for Johnny Graham, MD.  Subjective:    CC: L hip pain  HPI: Pt is a 67 y/o male c/o L hip pain ongoing for about 3 weeks. No MOI. Pt locates pain to anterior aspect of the hip, worse with hip flexion/raising the leg. Radicular sx present radiating down the lateral thigh to the knee. Infrequent mechanical sx. Sx causing night disturbance. Tightness in the back and legs. Also notes cramping in the calf. No recent diagnostic imaging.   He is a runner running 5 times a week.  Pain is not so bad with running.  He is able to get it to loosen up and feel better when he starts running but pain occurs worse with hip flexion in seated positioning.  Radiates: yes Aggravates: hip flexion Treatments tried: tramadol, Tylenol, IBU  Pertinent review of Systems: No fevers or chills  Relevant historical information: Dyslipidemia   Objective:    Vitals:   07/27/22 0840  BP: (!) 140/98  Pulse: (!) 55  SpO2: 99%   General: Well Developed, well nourished, and in no acute distress.   MSK: Left hip normal-appearing Nontender. Limited range of motion pain with flexion and internal rotation. Strength is intact.  Lab and Radiology Results  Procedure: Real-time Ultrasound Guided Injection of left hip joint intra-articular Device: Philips Affiniti 50G Images permanently stored and available for review in PACS Verbal informed consent obtained.  Discussed risks and benefits of procedure. Warned about infection, bleeding, hyperglycemia damage to structures among others. Patient expresses understanding and agreement Time-out conducted.   Noted no overlying erythema, induration, or other signs of local infection.   Skin prepped in a sterile fashion.   Local anesthesia: Topical Ethyl chloride.   With sterile technique and under real time ultrasound guidance: 40 mg of Kenalog and 2 mL Marcaine injected into hip joint. Fluid seen entering the joint capsule.    Completed without difficulty   Pain immediately resolved suggesting accurate placement of the medication.   Advised to call if fevers/chills, erythema, induration, drainage, or persistent bleeding.   Images permanently stored and available for review in the ultrasound unit.  Impression: Technically successful ultrasound guided injection.     X-ray images left hip and lumbar spine obtained today personally and independently interpreted  Left hip: Mild left hip DJD.  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  No acute fractures are visible.  Await formal radiology review   Impression and Recommendations:    Assessment and Plan: 67 y.o. male with left anterior hip pain concerning for hip impingement or labrum tear.  He does have a little bit of degenerative changes but less than I would expect based on his exam.  He had immediate great relief following intra-articular diagnostic hip injection which indicates that his pain is coming from his hip joint.  Hopefully he will have longer benefit from steroid injection.  If not next step should be MRI arthrogram of the hip.  PDMP not reviewed this encounter. Orders Placed This Encounter  Procedures   [place] LIMITED JOINT SPACE STRUCTURES LOW LEFT(NO LINKED CHARGES)    Order Specific Question:   Reason for Exam (SYMPTOM  OR DIAGNOSIS REQUIRED)    Answer:   left hip pain    Order Specific Question:   Preferred imaging location?    Answer:   [facility] HIP UNILAT  W OR W/O PELVIS 2-3 VIEWS LEFT    Standing Status:   Future    Number of Occurrences:   1    Standing Expiration Date:   08/26/2022    Order Specific Question:   Reason for Exam (SYMPTOM  OR DIAGNOSIS REQUIRED)    Answer:   left hip pain    Order Specific Question:   Preferred imaging location?    Answer:   [facility]   DG Lumbar Spine 2-3  Views    Standing Status:   Future    Number of Occurrences:   1    Standing Expiration Date:   08/26/2022    Order Specific Question:   Reason for Exam (SYMPTOM  OR DIAGNOSIS REQUIRED)    Answer:   left hip/thigh pain    Order Specific Question:   Preferred imaging location?    Answer:   [facility]   No orders of the defined types were placed in this encounter.   Discussed warning signs or symptoms. Please see discharge instructions. Patient expresses understanding.   The above documentation has been reviewed and is accurate and complete Johnny Boyer, M.D.

## 2022-07-27 ENCOUNTER — Other Ambulatory Visit: Payer: Self-pay

## 2022-07-27 ENCOUNTER — Ambulatory Visit: Payer: Medicare HMO | Admitting: Family Medicine

## 2022-07-27 ENCOUNTER — Ambulatory Visit (INDEPENDENT_AMBULATORY_CARE_PROVIDER_SITE_OTHER): Payer: Medicare HMO

## 2022-07-27 VITALS — BP 140/98 | HR 55 | Ht 69.0 in | Wt 199.0 lb

## 2022-07-27 DIAGNOSIS — M25552 Pain in left hip: Secondary | ICD-10-CM

## 2022-07-27 DIAGNOSIS — M545 Low back pain, unspecified: Secondary | ICD-10-CM | POA: Diagnosis not present

## 2022-07-27 DIAGNOSIS — M1612 Unilateral primary osteoarthritis, left hip: Secondary | ICD-10-CM | POA: Diagnosis not present

## 2022-07-27 NOTE — Patient Instructions (Addendum)
Thank you for coming in today.   You received an injection today. Seek immediate medical attention if the joint becomes red, extremely painful, or is oozing fluid.   If this shot does not last longer than 3 months next step is MRI arthrogram.   Let me know.

## 2022-07-29 ENCOUNTER — Encounter: Payer: Self-pay | Admitting: Family Medicine

## 2022-07-30 NOTE — Progress Notes (Signed)
Left hip x-ray is concerning for hip impingement.  If not better we will see this more accurately with an MRI.

## 2022-07-30 NOTE — Progress Notes (Signed)
Lumbar spine x-ray shows severe arthritis at places.

## 2022-09-27 ENCOUNTER — Encounter: Payer: Self-pay | Admitting: Family Medicine

## 2022-09-27 DIAGNOSIS — M25552 Pain in left hip: Secondary | ICD-10-CM

## 2022-09-27 NOTE — Telephone Encounter (Signed)
Per visit note 07/27/22:  Assessment and Plan: 67 y.o. male with left anterior hip pain concerning for hip impingement or labrum tear.  He does have a little bit of degenerative changes but less than I would expect based on his exam.  He had immediate great relief following intra-articular diagnostic hip injection which indicates that his pain is coming from his hip joint.  Hopefully he will have longer benefit from steroid injection.  If not next step should be MRI arthrogram of the hip.

## 2022-10-15 ENCOUNTER — Ambulatory Visit (INDEPENDENT_AMBULATORY_CARE_PROVIDER_SITE_OTHER): Payer: Medicare HMO

## 2022-10-15 ENCOUNTER — Encounter: Payer: Self-pay | Admitting: Emergency Medicine

## 2022-10-15 DIAGNOSIS — Z0000 Encounter for general adult medical examination without abnormal findings: Secondary | ICD-10-CM

## 2022-10-15 NOTE — Progress Notes (Signed)
Subjective:   Johnny Boyer is a 67 y.o. male who presents for an Initial Medicare Annual Wellness Visit.  Visit Complete: Virtual  I connected with  Johnny Boyer on 10/15/22 by a audio enabled telemedicine application and verified that I am speaking with the correct person using two identifiers.  Patient Location: Home  Provider Location: Home Office  I discussed the limitations of evaluation and management by telemedicine. The patient expressed understanding and agreed to proceed.  Patient Medicare AWV questionnaire was completed by the patient on 10/15/2022; I have confirmed that all information answered by patient is correct and no changes since this date.   Cardiac Risk Factors include: advanced age (>46men, >39 women);male gender;dyslipidemia     Objective:    Today's Vitals   10/15/22 1114  PainSc: 4    There is no height or weight on file to calculate BMI.     10/15/2022   11:16 AM 04/21/2020    9:05 AM 10/31/2019   11:36 AM  Advanced Directives  Does Patient Have a Medical Advance Directive? No No No  Would patient like information on creating a medical advance directive? Yes (MAU/Ambulatory/Procedural Areas - Information given) No - Patient declined No - Patient declined    Current Medications (verified) Outpatient Encounter Medications as of 10/15/2022  Medication Sig   aspirin EC 81 MG tablet Take 81 mg by mouth daily.   atorvastatin (LIPITOR) 80 MG tablet TAKE 1 TABLET BY MOUTH EVERY DAY   No facility-administered encounter medications on file as of 10/15/2022.    Allergies (verified) Patient has no known allergies.   History: Past Medical History:  Diagnosis Date   Hemorrhoids    Hyperlipidemia    Myocardial infarction (HCC) 2009   Sleep apnea    no cpap used did not tolerate, moderate osa   Past Surgical History:  Procedure Laterality Date   CARDIAC CATHETERIZATION  2009   stents to mid rca x 2   EVALUATION UNDER ANESTHESIA WITH  HEMORRHOIDECTOMY N/A 04/21/2020   Procedure: HEMORRHOIDECTOMY X3 WITH LIGATION AND HEMORRHOIDOPEXY ANORECTAL EXAMINATION UNDER ANESTHESIA;  Surgeon: Karie Soda, MD;  Location: [facility];  Service: General;  Laterality: N/A;   FRACTURE SURGERY  1974   right wrist   SPINE SURGERY  2008 and 2009   cervical C 3 C4 C5 fusion   Family History  Problem Relation Age of Onset   Heart disease Mother    Hyperlipidemia Mother    Hyperlipidemia Sister    Hypertension Sister    Hyperlipidemia Brother    Hypertension Brother    Social History   Socioeconomic History   Marital status: Married    Spouse name: Not on file   Number of children: Not on file   Years of education: Not on file   Highest education level: Doctorate  Occupational History   Not on file  Tobacco Use   Smoking status: Never   Smokeless tobacco: Never  Vaping Use   Vaping Use: Never used  Substance and Sexual Activity   Alcohol use: Yes    Comment: 1 or 2 beers per day   Drug use: Never   Sexual activity: Not on file  Other Topics Concern   Not on file  Social History Narrative   Not on file   Social Determinants of Health   Financial Resource Strain: Low Risk  (10/15/2022)   Overall Financial Resource Strain (CARDIA)    Difficulty of Paying Living Expenses: Not hard at  all  Food Insecurity: No Food Insecurity (10/15/2022)   Hunger Vital Sign    Worried About Running Out of Food in the Last Year: Never true    Ran Out of Food in the Last Year: Never true  Transportation Needs: No Transportation Needs (10/15/2022)   PRAPARE - Administrator, Civil Service (Medical): No    Lack of Transportation (Non-Medical): No  Physical Activity: Sufficiently Active (10/15/2022)   Exercise Vital Sign    Days of Exercise per Week: 5 days    Minutes of Exercise per Session: 40 min  Stress: No Stress Concern Present (10/15/2022)   [employer] of Occupational Health - Occupational Stress  Questionnaire    Feeling of Stress : Not at all  Social Connections: Moderately Integrated (10/15/2022)   Social Connection and Isolation Panel [NHANES]    Frequency of Communication with Friends and Family: Once a week    Frequency of Social Gatherings with Friends and Family: More than three times a week    Attends Religious Services: Never    Database administrator or Organizations: Yes    Attends Engineer, structural: More than 4 times per year    Marital Status: Married    Tobacco Counseling Counseling given: Not Answered   Clinical Intake:  Pre-visit preparation completed: Yes  Pain : 0-10 Pain Score: 4  Pain Location: Hip     Diabetes: No  How often do you need to have someone help you when you read instructions, pamphlets, or other written materials from your doctor or pharmacy?: 1 - Never What is the last grade level you completed in school?: doctorate  Interpreter Needed?: No      Activities of Daily Living    10/15/2022   11:14 AM 10/13/2022    6:08 AM  In your present state of health, do you have any difficulty performing the following activities:  Hearing? 0 0  Vision? 0 0  Difficulty concentrating or making decisions? 0 0  Walking or climbing stairs? 0 0  Dressing or bathing? 0 0  Doing errands, shopping? 0 0  Preparing Food and eating ? N N  Using the Toilet? N N  In the past six months, have you accidently leaked urine? N N  Do you have problems with loss of bowel control? N N  Managing your Medications? N N  Managing your Finances? N N  Housekeeping or managing your Housekeeping? N N    Patient Care Team: Georgina Quint, MD as PCP - General (Internal Medicine) Karie Soda, MD as Consulting Physician (General Surgery)  Indicate any recent Medical Services you may have received from other than [facility] providers in the past year (date may be approximate).     Assessment:   This is a routine wellness examination for  Johnny Boyer.  Hearing/Vision screen No results found.  Dietary issues and exercise activities discussed:     Goals Addressed             This Visit's Progress    im happy where im at        Depression Screen    10/15/2022   11:17 AM 12/19/2021    8:10 AM 12/15/2020    8:05 AM 12/15/2019    8:04 AM 12/03/2018    1:22 PM  PHQ 2/9 Scores  PHQ - 2 Score 0 0 0 0 0    Fall Risk    10/15/2022   11:16 AM 10/13/2022    6:08 AM  12/19/2021    8:10 AM 12/15/2020    8:04 AM 12/15/2019    8:03 AM  Fall Risk   Falls in the past year? 0 0 0 0 0  Number falls in past yr: 0 0 0 0   Injury with Fall? 0 0 0 0   Risk for fall due to : No Fall Risks  No Fall Risks    Follow up Falls prevention discussed  Falls evaluation completed  Falls evaluation completed    MEDICARE RISK AT HOME:  Medicare Risk at Home - 10/15/22 1116     Any stairs in or around the home? No    If so, are there any without handrails? No    Home free of loose throw rugs in walkways, pet beds, electrical cords, etc? Yes    Adequate lighting in your home to reduce risk of falls? Yes    Life alert? No    Use of a cane, walker or w/c? No    Grab bars in the bathroom? No    Shower chair or bench in shower? No    Elevated toilet seat or a handicapped toilet? No             TIMED UP AND GO:  Was the test performed? No    Cognitive Function:        10/15/2022   11:18 AM  6CIT Screen  What Year? 0 points  What month? 0 points  What time? 0 points  Count back from 20 0 points  Months in reverse 0 points  Repeat phrase 0 points  Total Score 0 points    Immunizations Immunization History  Administered Date(s) Administered   Influenza,inj,quad, With Preservative 02/12/2018   Influenza-Unspecified 01/05/2013, 03/01/2021   PFIZER(Purple Top)SARS-COV-2 Vaccination 06/23/2019, 07/21/2019, 03/08/2020, 08/14/2020, 03/21/2021   Pneumococcal Conjugate-13 12/15/2019   Tdap 01/05/2013   Zoster Recombinant(Shingrix)  12/15/2020    TDAP status: Up to date  Flu Vaccine status: Up to date  Pneumococcal vaccine status: Up to date  Covid-19 vaccine status: Completed vaccines  Qualifies for Shingles Vaccine? Yes   Zostavax completed Yes   Shingrix Completed?: No.    Education has been provided regarding the importance of this vaccine. Patient has been advised to call insurance company to determine out of pocket expense if they have not yet received this vaccine. Advised may also receive vaccine at local pharmacy or Health Dept. Verbalized acceptance and understanding.  Screening Tests Health Maintenance  Topic Date Due   Zoster Vaccines- Shingrix (2 of 2) 02/09/2021   COVID-19 Vaccine (6 - 2023-24 season) 12/15/2021   Fecal DNA (Cologuard)  02/23/2022   Pneumonia Vaccine 58+ Years old (2 of 2 - PPSV23 or PCV20) 12/20/2022 (Originally 12/14/2020)   INFLUENZA VACCINE  11/15/2022   DTaP/Tdap/Td (2 - Td or Tdap) 01/06/2023   Medicare Annual Wellness (AWV)  10/15/2023   Hepatitis C Screening  Completed   HPV VACCINES  Aged Out    Health Maintenance  Health Maintenance Due  Topic Date Due   Zoster Vaccines- Shingrix (2 of 2) 02/09/2021   COVID-19 Vaccine (6 - 2023-24 season) 12/15/2021   Fecal DNA (Cologuard)  02/23/2022    Colorectal cancer screening: Type of screening: Cologuard. Completed 02/24/2019. Repeat every 3 years  Lung Cancer Screening: (Low Dose CT Chest recommended if Age 19-80 years, 20 pack-year currently smoking OR have quit w/in 15years.) does qualify.   Lung Cancer Screening Referral: na  Additional Screening:  Hepatitis C Screening: does  qualify; Completed 12/10/2019  Vision Screening: Recommended annual ophthalmology exams for early detection of glaucoma and other disorders of the eye. Is the patient up to date with their annual eye exam?  Yes  Who is the provider or what is the name of the office in which the patient attends annual eye exams? [facility] If pt is not  established with a provider, would they like to be referred to a provider to establish care?  Established .   Dental Screening: Recommended annual dental exams for proper oral hygiene  Diabetic Foot Exam:   Community Resource Referral / Chronic Care Management: CRR required this visit?  No   CCM required this visit?  No    Plan:     I have personally reviewed and noted the following in the patient's chart:   Medical and social history Use of alcohol, tobacco or illicit drugs  Current medications and supplements including opioid prescriptions. Patient is not currently taking opioid prescriptions. Functional ability and status Nutritional status Physical activity Advanced directives List of other physicians Hospitalizations, surgeries, and ER visits in previous 12 months Vitals Screenings to include cognitive, depression, and falls Referrals and appointments  In addition, I have reviewed and discussed with patient certain preventive protocols, quality metrics, and best practice recommendations. A written personalized care plan for preventive services as well as general preventive health recommendations were provided to patient.     Delana Meyer   10/15/2022   After Visit Summary: (MyChart) Due to this being a telephonic visit, the after visit summary with patients personalized plan was offered to patient via MyChart   Nurse Notes: none

## 2022-10-15 NOTE — Patient Instructions (Signed)
Health Maintenance, Male Adopting a healthy lifestyle and getting preventive care are important in promoting health and wellness. Ask your health care provider about: The right schedule for you to have regular tests and exams. Things you can do on your own to prevent diseases and keep yourself healthy. What should I know about diet, weight, and exercise? Eat a healthy diet  Eat a diet that includes plenty of vegetables, fruits, low-fat dairy products, and lean protein. Do not eat a lot of foods that are high in solid fats, added sugars, or sodium. Maintain a healthy weight Body mass index (BMI) is a measurement that can be used to identify possible weight problems. It estimates body fat based on height and weight. Your health care provider can help determine your BMI and help you achieve or maintain a healthy weight. Get regular exercise Get regular exercise. This is one of the most important things you can do for your health. Most adults should: Exercise for at least 150 minutes each week. The exercise should increase your heart rate and make you sweat (moderate-intensity exercise). Do strengthening exercises at least twice a week. This is in addition to the moderate-intensity exercise. Spend less time sitting. Even light physical activity can be beneficial. Watch cholesterol and blood lipids Have your blood tested for lipids and cholesterol at 68 years of age, then have this test every 5 years. You may need to have your cholesterol levels checked more often if: Your lipid or cholesterol levels are high. You are older than 68 years of age. You are at high risk for heart disease. What should I know about cancer screening? Many types of cancers can be detected early and may often be prevented. Depending on your health history and family history, you may need to have cancer screening at various ages. This may include screening for: Colorectal cancer. Prostate cancer. Skin cancer. Lung  cancer. What should I know about heart disease, diabetes, and high blood pressure? Blood pressure and heart disease High blood pressure causes heart disease and increases the risk of stroke. This is more likely to develop in people who have high blood pressure readings or are overweight. Talk with your health care provider about your target blood pressure readings. Have your blood pressure checked: Every 3-5 years if you are 18-39 years of age. Every year if you are 40 years old or older. If you are between the ages of 65 and 75 and are a current or former smoker, ask your health care provider if you should have a one-time screening for abdominal aortic aneurysm (AAA). Diabetes Have regular diabetes screenings. This checks your fasting blood sugar level. Have the screening done: Once every three years after age 45 if you are at a normal weight and have a low risk for diabetes. More often and at a younger age if you are overweight or have a high risk for diabetes. What should I know about preventing infection? Hepatitis B If you have a higher risk for hepatitis B, you should be screened for this virus. Talk with your health care provider to find out if you are at risk for hepatitis B infection. Hepatitis C Blood testing is recommended for: Everyone born from 1945 through 1965. Anyone with known risk factors for hepatitis C. Sexually transmitted infections (STIs) You should be screened each year for STIs, including gonorrhea and chlamydia, if: You are sexually active and are younger than 68 years of age. You are older than 68 years of age and your   health care provider tells you that you are at risk for this type of infection. Your sexual activity has changed since you were last screened, and you are at increased risk for chlamydia or gonorrhea. Ask your health care provider if you are at risk. Ask your health care provider about whether you are at high risk for HIV. Your health care provider  may recommend a prescription medicine to help prevent HIV infection. If you choose to take medicine to prevent HIV, you should first get tested for HIV. You should then be tested every 3 months for as long as you are taking the medicine. Follow these instructions at home: Alcohol use Do not drink alcohol if your health care provider tells you not to drink. If you drink alcohol: Limit how much you have to 0-2 drinks a day. Know how much alcohol is in your drink. In the [place] one drink equals one 12 oz bottle of beer (355 mL), one 5 oz glass of wine (148 mL), or one 1 oz glass of hard liquor (44 mL). Lifestyle Do not use any products that contain nicotine or tobacco. These products include cigarettes, chewing tobacco, and vaping devices, such as e-cigarettes. If you need help quitting, ask your health care provider. Do not use street drugs. Do not share needles. Ask your health care provider for help if you need support or information about quitting drugs. General instructions Schedule regular health, dental, and eye exams. Stay current with your vaccines. Tell your health care provider if: You often feel depressed. You have ever been abused or do not feel safe at home. Summary Adopting a healthy lifestyle and getting preventive care are important in promoting health and wellness. Follow your health care provider's instructions about healthy diet, exercising, and getting tested or screened for diseases. Follow your health care provider's instructions on monitoring your cholesterol and blood pressure. This information is not intended to replace advice given to you by your health care provider. Make sure you discuss any questions you have with your health care provider. Document Revised: 08/22/2020 Document Reviewed: 08/22/2020 Elsevier Patient Education  2024 [employer]  

## 2022-10-16 ENCOUNTER — Ambulatory Visit (INDEPENDENT_AMBULATORY_CARE_PROVIDER_SITE_OTHER): Payer: Medicare HMO | Admitting: Sports Medicine

## 2022-10-16 ENCOUNTER — Other Ambulatory Visit (INDEPENDENT_AMBULATORY_CARE_PROVIDER_SITE_OTHER): Payer: Medicare HMO

## 2022-10-16 ENCOUNTER — Ambulatory Visit (INDEPENDENT_AMBULATORY_CARE_PROVIDER_SITE_OTHER): Payer: Medicare HMO

## 2022-10-16 DIAGNOSIS — M25552 Pain in left hip: Secondary | ICD-10-CM

## 2022-10-16 DIAGNOSIS — M25452 Effusion, left hip: Secondary | ICD-10-CM | POA: Diagnosis not present

## 2022-10-16 MED ORDER — GADOBUTROL 1 MMOL/ML IV SOLN
1.0000 mL | Freq: Once | INTRAVENOUS | Status: AC | PRN
Start: 2022-10-16 — End: 2022-10-16
  Administered 2022-10-16: 1 mL via INTRAVENOUS

## 2022-10-16 NOTE — Progress Notes (Signed)
    Procedures performed today:    Procedure: Real-time Ultrasound Guided gadolinium contrast injection of left hip joint Device: Samsung HS60  Verbal informed consent obtained.  Time-out conducted.  Noted no overlying erythema, induration, or other signs of local infection.  Skin prepped in a sterile fashion.  Local anesthesia: Topical Ethyl chloride.  With sterile technique and under real time ultrasound guidance: Noted mild appearing synovitis as well as a nonpulsatile cystic structure that appeared anterior to the hip joint and adjacent to the iliopsoas tendon, I advanced a 22-gauge spinal needle into the structure and aspirated approximately 10 mL of slightly cloudy straw-colored fluid, syringe switched and the needle was advanced to the femoral head/neck junction, contacted bone and injected 1 cc kenalog 40, 2 cc lidocaine, 2 cc bupivacaine, syringe switched and 0.1 cc gadolinium injected, syringe again switched and 10 cc sterile saline used to fully distend the joint. Joint visualized and capsule seen distending confirming intra-articular placement of contrast material and medication, as I injected into the hip joint it did appear that some of the contrast and saline filled the cystic structure anterior to the joint.. Completed without difficulty  Advised to call if fevers/chills, erythema, induration, drainage, or persistent bleeding.  Images permanently stored in PACS Impression: Technically successful ultrasound guided para-articular cyst aspiration and gadolinium contrast injection for MR arthrography.  Please see separate MR arthrogram report.  Independent interpretation of notes and tests performed by another provider:   None.  Brief History, Exam, Impression, and Recommendations:    Left hip pain Injection performed today for MR arthrography, further management per primary treating provider. Of note there was a cystic structure anterior to the hip joint, this was aspirated,  fluid was cloudy and will be sent off for cell count, cultures, crystal analysis.     ____________________________________________ Ihor Austin. Benjamin Stain, M.D., ABFM., CAQSM., AME. Primary Care and Sports Medicine [facility]  Adjunct Professor of Family Medicine  [employer] [facility] [employer]  Restaurant manager, fast food

## 2022-10-16 NOTE — Assessment & Plan Note (Addendum)
Injection performed today for MR arthrography, further management per primary treating provider. Of note there was a cystic structure anterior to the hip joint, this was aspirated, fluid was cloudy and will be sent off for cell count, cultures, crystal analysis.

## 2022-10-17 ENCOUNTER — Encounter: Payer: Self-pay | Admitting: Sports Medicine

## 2022-10-19 LAB — ANAEROBIC AND AEROBIC CULTURE: MICRO NUMBER:: 15153236

## 2022-10-22 LAB — SYNOVIAL FLUID ANALYSIS, COMPLETE
Basophils, %: 0 %
Eosinophils-Synovial: 0 % (ref 0–2)
Lymphocytes-Synovial Fld: 22 % (ref 0–74)
Monocyte/Macrophage: 12 % (ref 0–69)
Neutrophil, Synovial: 66 % — ABNORMAL HIGH (ref 0–24)
Synoviocytes, %: 0 % (ref 0–15)
WBC, Synovial: 13130 cells/uL — ABNORMAL HIGH (ref <150)

## 2022-10-22 LAB — ANAEROBIC AND AEROBIC CULTURE
AER RESULT:: NO GROWTH
MICRO NUMBER:: 15153235
SPECIMEN QUALITY:: ADEQUATE
SPECIMEN QUALITY:: ADEQUATE

## 2022-10-23 ENCOUNTER — Encounter: Payer: Self-pay | Admitting: Sports Medicine

## 2022-10-25 ENCOUNTER — Encounter: Payer: Self-pay | Admitting: Family Medicine

## 2022-10-25 NOTE — Progress Notes (Signed)
MRI of the hip shows significant labral tearing and degeneration and some arthritis in the hip.  This probably explains her hip pain.  You had a cortisone shot with the arthrogram injection with Dr. Benjamin Stain recently.  We can repeat that basic injection every 3 months if needed.  If your symptoms or not well-controlled it may be worth her time to talk to a surgeon about potential hip replacement.

## 2022-10-25 NOTE — Telephone Encounter (Signed)
Called and spoke w/ pt in full detail. Explained that a medical dictation software was used to make the result note and that it's not perfect, but is very helpful in managing the workload of medical documentation. Apologized numerous times.   I clarified that Dr. Melvia Heaps injection did in fact inject the hip w/ a steroid as well as the contrast. He was shocked by this information. He noted that his hip was feeling pretty good. And I explained that that was likely due to the steroid within Dr. Melvia Heaps injection. I explained the steroid injection would be similar to what Dr. Denyse Amass could do in the office every 3 months or so. We also discussed surgery and the potential for a hip replacement. He expressed apprehension about his ability to continue to run after a hip replacement.   At the end of the call he still seemed disappointed, so I enlightened him w/ some of the more silly oopsies the Dragon software goofs up on and he seemed to better understand that the transcription isn't going to be perfect.   He is coming in for a f/u next Friday.

## 2022-11-01 NOTE — Progress Notes (Signed)
I, [name], CMA acting as a scribe for Clementeen Graham, MD.  Johnny Boyer is a 67 y.o. male who presents to [facility] today for f/u L hip pain and MRI arthrogram review. Pt was last seen by Dr. Denyse Amass on 07/27/22 and was given a L intra-articular hip steroid injection. Pt sent a MyChart message on June 13th reporting L hip pain returning about 1 month after the injection and a MRI arthrogram was ordered.   Today, pt reports no change in sx since last visit. MRI review today. Steroid injection performed as part of the arthrogram did not help. He has not had PT yet.   Dx imaging: 10/16/22 L hip MRI arthrogram 07/27/22 L hip & L-spine XR  Pertinent review of systems: no fever or chills  Relevant historical information: elevated BP   Exam:  BP (!) 152/108   Pulse (!) 54   Ht 5\' 9"  (1.753 m)   Wt 196 lb 3.2 oz (89 kg)   SpO2 98%   BMI 28.97 kg/m  General: Well Developed, well nourished, and in no acute distress.   MSK: Left hip reduced ROM    Lab and Radiology Results  EXAM: MRI OF THE LEFT HIP WITH CONTRAST (MR Arthrogram)   TECHNIQUE: Multiplanar, multisequence MR imaging of the hip was performed immediately following contrast injection into the hip joint under fluoroscopic guidance. No intravenous contrast was administered.   COMPARISON:  None Available.   FINDINGS: Bone   No hip fracture, dislocation or avascular necrosis. No aggressive osseous lesion.   SI joints are normal. No SI joint widening or erosive changes.   Alignment   Normal. No subluxation.   Dysplasia   None.   Joint effusion   Intraarticular contrast distends the left hip joint capsule. Otherwise, no joint effusions.   Labrum   Severe left superior labral degeneration with a 4 mm superior paralabral cyst likely reflecting a small occult tear.   Cartilage   Partial-thickness cartilage loss of the femoral head and acetabulum bilateral.   Capsule  and ligaments   Normal.   Muscles and Tendons   Flexors: Normal.   Extensors: Normal.   Abductors: Normal.   Adductors: Normal.   Rotators: Normal.   Hamstrings: Mild tendinosis of the left hamstring origin with a tiny partial thickness tear.   Other Findings   Contrast opacifies the left iliopsoas bursa which communicates with the left hip joint space.   Viscera   No abnormality seen in pelvis. No lymphadenopathy. No free fluid in the pelvis.   IMPRESSION: 1. Severe left superior labral degeneration with a 4 mm superior paralabral cyst likely reflecting a small occult tear. 2. Partial-thickness cartilage loss of the femoral head and acetabulum bilateral consistent with mild osteoarthritis. 3. Mild tendinosis of the left hamstring origin with a tiny partial thickness tear.     Electronically Signed   By: Elige Ko M.D.   On: 10/25/2022 06:35 I, Clementeen Graham, personally (independently) visualized and performed the interpretation of the images attached in this note.      Assessment and Plan: 67 y.o. 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  He has not responded well to injections x2 now. He has not tried formal PT yet but I am not optimistic about that.  Plan to refer to orthopedic surgery regarding labrum tear and possible surgical options. Total hip replacement is an option too.  Could also try  PT.   Total encounter time 20 minutes including face-to-face time with the patient and, reviewing past medical record, and charting on the date of service.      PDMP not reviewed this encounter. Orders Placed This Encounter  Procedures   Ambulatory referral to Orthopedic Surgery    Referral Priority:   Routine    Referral Type:   Surgical    Referral Reason:   Specialty Services Required    Referred to Provider:   Huel Cote, MD    Requested Specialty:   Orthopedic Surgery    Number of Visits Requested:   1   No orders of  the defined types were placed in this encounter.    Discussed warning signs or symptoms. Please see discharge instructions. Patient expresses understanding.   The above documentation has been reviewed and is accurate and complete Clementeen Graham, M.D.

## 2022-11-02 ENCOUNTER — Ambulatory Visit (INDEPENDENT_AMBULATORY_CARE_PROVIDER_SITE_OTHER): Payer: Medicare HMO | Admitting: Family Medicine

## 2022-11-02 ENCOUNTER — Encounter: Payer: Self-pay | Admitting: Family Medicine

## 2022-11-02 VITALS — BP 152/108 | HR 54 | Ht 69.0 in | Wt 196.2 lb

## 2022-11-02 DIAGNOSIS — M25552 Pain in left hip: Secondary | ICD-10-CM

## 2022-11-02 NOTE — Patient Instructions (Addendum)
Thank you for coming in today.   You should hear from Dr Serena Croissant office.   Please let me know if you have any problems.

## 2022-11-16 ENCOUNTER — Ambulatory Visit (HOSPITAL_BASED_OUTPATIENT_CLINIC_OR_DEPARTMENT_OTHER): Payer: Medicare HMO | Admitting: Orthopaedic Surgery

## 2022-11-16 DIAGNOSIS — M25852 Other specified joint disorders, left hip: Secondary | ICD-10-CM | POA: Diagnosis not present

## 2022-11-16 NOTE — Progress Notes (Signed)
Chief Complaint: Left hip pain     History of Present Illness:    Johnny Boyer is a 67 y.o. male presents with ongoing left hip pain that has been worsening over the course of this year.  He has previously been seen by Dr. Denyse Amass as well as Dr. Karie Schwalbe.  He has been experiencing a pinch in his groin predominantly with putting socks on or sitting with the leg figure-of-four.  He is a very avid runner and does enjoy running.  He denies significant pain while running predominantly only with terminal range of motion.  He did have an injection with Dr. Denyse Amass which did give him significant relief but is subsequently worn off.  He is here today for further discussion.    Surgical History:   None  PMH/PSH/Family History/Social History/Meds/Allergies:    Past Medical History:  Diagnosis Date   Hemorrhoids    Hyperlipidemia    Myocardial infarction (HCC) 2009   Sleep apnea    no cpap used did not tolerate, moderate osa   Past Surgical History:  Procedure Laterality Date   CARDIAC CATHETERIZATION  2009   stents to mid rca x 2   EVALUATION UNDER ANESTHESIA WITH HEMORRHOIDECTOMY N/A 04/21/2020   Procedure: HEMORRHOIDECTOMY X3 WITH LIGATION AND HEMORRHOIDOPEXY ANORECTAL EXAMINATION UNDER ANESTHESIA;  Surgeon: Karie Soda, MD;  Location: [facility] [place];  Service: General;  Laterality: N/A;   FRACTURE SURGERY  1974   right wrist   SPINE SURGERY  2008 and 2009   cervical C 3 C4 C5 fusion   Social History   Socioeconomic History   Marital status: Married    Spouse name: Not on file   Number of children: Not on file   Years of education: Not on file   Highest education level: Doctorate  Occupational History   Not on file  Tobacco Use   Smoking status: Never   Smokeless tobacco: Never  Vaping Use   Vaping status: Never Used  Substance and Sexual Activity   Alcohol use: Yes    Comment: 1 or 2 beers per day   Drug use: Never   Sexual  activity: Not on file  Other Topics Concern   Not on file  Social History Narrative   Not on file   Social Determinants of Health   Financial Resource Strain: Low Risk  (10/15/2022)   Overall Financial Resource Strain (CARDIA)    Difficulty of Paying Living Expenses: Not hard at all  Food Insecurity: No Food Insecurity (10/15/2022)   Hunger Vital Sign    Worried About Running Out of Food in the Last Year: Never true    Ran Out of Food in the Last Year: Never true  Transportation Needs: No Transportation Needs (10/15/2022)   PRAPARE - Administrator, Civil Service (Medical): No    Lack of Transportation (Non-Medical): No  Physical Activity: Sufficiently Active (10/15/2022)   Exercise Vital Sign    Days of Exercise per Week: 5 days    Minutes of Exercise per Session: 40 min  Stress: No Stress Concern Present (10/15/2022)   [employer] of Occupational Health - Occupational Stress Questionnaire    Feeling of Stress : Not at all  Social Connections: Moderately Integrated (10/15/2022)   Social Connection and Isolation Panel [NHANES]    Frequency  of Communication with Friends and Family: Once a week    Frequency of Social Gatherings with Friends and Family: More than three times a week    Attends Religious Services: Never    Database administrator or Organizations: Yes    Attends Engineer, structural: More than 4 times per year    Marital Status: Married   Family History  Problem Relation Age of Onset   Heart disease Mother    Hyperlipidemia Mother    Hyperlipidemia Sister    Hypertension Sister    Hyperlipidemia Brother    Hypertension Brother    No Known Allergies Current Outpatient Medications  Medication Sig Dispense Refill   aspirin EC 81 MG tablet Take 81 mg by mouth daily.     atorvastatin (LIPITOR) 80 MG tablet TAKE 1 TABLET BY MOUTH EVERY DAY 90 tablet 2   No current facility-administered medications for this visit.   No results found.  Review of  Systems:   A ROS was performed including pertinent positives and negatives as documented in the HPI.  Physical Exam :   Constitutional: NAD and appears stated age Neurological: Alert and oriented Psych: Appropriate affect and cooperative There were no vitals taken for this visit.   Comprehensive Musculoskeletal Exam:    Inspection Right Left  Skin No atrophy or gross abnormalities appreciated No atrophy or gross abnormalities appreciated  Palpation    Tenderness None MRI acetabular  Crepitus None None  Range of Motion    Flexion (passive) 120 120  Extension 30 30  IR 30 30 with pain  ER 45 45  Strength    Flexion  5/5 5/5  Extension 5/5 5/5  Special Tests    FABER Negative Negative  FADIR Negative Negative  ER Lag/Capsular Insufficiency Negative Negative  Instability Negative Negative  Sacroiliac pain Negative  Negative   Instability    Generalized Laxity No No  Neurologic    sciatic, femoral, obturator nerves intact to light sensation  Vascular/Lymphatic    DP pulse 2+ 2+  Lumbar Exam    Patient has symmetric lumbar range of motion with negative pain referral to hip     Imaging:   Xray (4 views left hip): Significant CAM impingement with maintained femoracetabular joint  MRI (left hip): Alpha angle of 60 degrees with labral tear  I personally reviewed and interpreted the radiographs.   Assessment:   67 y.o. 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  This is bothersome at terminal motion.  I did discuss that he does have quite a complex hip.  He is quite active enjoys running.  I did discuss radiographically he has minimal Tonnis grade 1 arthritic findings and as result he would be potentially a candidate for hip arthroscopy.  I did also discuss that the hip arthroplasty would likely give him a permanent fix but this is somewhat complicated by the fact that he is a runner and would likely put an implant to multiple cycles.  At  this time he is still very active and is still working.  To that effect I am concerned about premature wear of an arthroplasty.  Given that we did further discuss hip arthroscopy/cam debridement and labral repair.  At this time I would like to begin with some initial stretching and physical therapy.  I will plan to see him back in November to monitor his progress with this.  We may consider hip arthroscopy at that time if no progression.  Plan :    -Return to clinic following physical therapy     I personally saw and evaluated the patient, and participated in the management and treatment plan.  Huel Cote, MD Attending Physician, Orthopedic Surgery  This document was dictated using Dragon voice recognition software. A reasonable attempt at proof reading has been made to minimize errors.

## 2022-12-03 ENCOUNTER — Encounter (HOSPITAL_BASED_OUTPATIENT_CLINIC_OR_DEPARTMENT_OTHER): Payer: Self-pay | Admitting: Orthopaedic Surgery

## 2022-12-04 ENCOUNTER — Encounter: Payer: Self-pay | Admitting: Emergency Medicine

## 2022-12-04 NOTE — Telephone Encounter (Signed)
He should schedule annual physical for 2024

## 2022-12-04 NOTE — Telephone Encounter (Signed)
Patient has been scheduled for a CPE. Advised him that he could have a referral placed after being seen.

## 2022-12-18 ENCOUNTER — Encounter (HOSPITAL_BASED_OUTPATIENT_CLINIC_OR_DEPARTMENT_OTHER): Payer: Self-pay | Admitting: Physical Therapy

## 2022-12-18 ENCOUNTER — Other Ambulatory Visit: Payer: Self-pay

## 2022-12-18 ENCOUNTER — Ambulatory Visit (HOSPITAL_BASED_OUTPATIENT_CLINIC_OR_DEPARTMENT_OTHER): Payer: Medicare HMO | Attending: Orthopaedic Surgery | Admitting: Physical Therapy

## 2022-12-18 DIAGNOSIS — M25552 Pain in left hip: Secondary | ICD-10-CM | POA: Insufficient documentation

## 2022-12-18 DIAGNOSIS — M25652 Stiffness of left hip, not elsewhere classified: Secondary | ICD-10-CM

## 2022-12-18 NOTE — Therapy (Signed)
OUTPATIENT PHYSICAL THERAPY LOWER EXTREMITY EVALUATION   Patient Name: Johnny Boyer MRN: [id number] DOB:1955-04-26, 67 y.o., male Today's Date: 12/18/2022  END OF SESSION:   Past Medical History:  Diagnosis Date   Hemorrhoids    Hyperlipidemia    Myocardial infarction (HCC) 2009   Sleep apnea    no cpap used did not tolerate, moderate osa   Past Surgical History:  Procedure Laterality Date   CARDIAC CATHETERIZATION  2009   stents to mid rca x 2   EVALUATION UNDER ANESTHESIA WITH HEMORRHOIDECTOMY N/A 04/21/2020   Procedure: HEMORRHOIDECTOMY X3 WITH LIGATION AND HEMORRHOIDOPEXY ANORECTAL EXAMINATION UNDER ANESTHESIA;  Surgeon: Karie Soda, MD;  Location: [facility] [place];  Service: General;  Laterality: N/A;   FRACTURE SURGERY  1974   right wrist   SPINE SURGERY  2008 and 2009   cervical C 3 C4 C5 fusion   Patient Active Problem List   Diagnosis Date Noted   Left hip pain 07/23/2022   Prolapsed internal hemorrhoids, grade 4, s/p ligation/pexy/hemorrhoidectomy 04/21/2020   History of coronary artery disease 12/15/2019   External hemorrhoids 12/15/2019   Dyslipidemia 12/15/2019    PCP: Altamese Dilling MD  REFERRING PROVIDER: Dr Huel Cote   REFERRING DIAG: Left Hip Pain   THERAPY DIAG:  No diagnosis found.  Rationale for Evaluation and Treatment: Rehabilitation  ONSET DATE: Over the course of this year  SUBJECTIVE:   SUBJECTIVE STATEMENT: The patient has had an increase in left hip pain over the past year. He had no incident that caused the pain. The right hip has similar symptoms but it is not quite as bad.   PERTINENT HISTORY: MI 2009   PAIN:  Are you having pain? Yes: NPRS scale: 4-5/10 Pain location: aching in the front of the hip  Pain description: aching  Aggravating factors: sitting and crossing legs  Relieving factors: Stretch in the morning   PRECAUTIONS: None  RED FLAGS: None   WEIGHT BEARING RESTRICTIONS: No  FALLS:  Has  patient fallen in last 6 months? No  LIVING ENVIRONMENT: No steps in his house OCCUPATION:  Works for a [employer]. Does a lot of walking and also sits at a computer   Hobbies: running  Swimming    PLOF: Independent  PATIENT GOALS:  To get a plan for strengthening and stretching   NEXT MD VISIT:    OBJECTIVE:   DIAGNOSTIC FINDINGS:  MPRESSION: 1. Severe left superior labral degeneration with a 4 mm superior paralabral cyst likely reflecting a small occult tear. 2. Partial-thickness cartilage loss of the femoral head and acetabulum bilateral consistent with mild osteoarthritis. 3. Mild tendinosis of the left hamstring origin with a tiny partial thickness tear.  PATIENT SURVEYS:  Potential 1x visit  COGNITION: Overall cognitive status: Within functional limits for tasks assessed     SENSATION:      PALPATION: Tender to palpaation in the left TFL   LOWER EXTREMITY ROM:  Passive ROM Right eval Left eval  Hip flexion  Painful passed 90 degrees   Hip extension    Hip abduction    Hip adduction    Hip internal rotation  Painful passed 5 degrees   Hip external rotation  Painful passed 60 degrees   Knee flexion    Knee extension    Ankle dorsiflexion    Ankle plantarflexion    Ankle inversion    Ankle eversion     (Blank rows = not tested)  LOWER EXTREMITY MMT:  MMT Right  eval Left eval  Hip flexion 41.1 43.7  Hip extension    Hip abduction 55.9 51.0  Hip adduction    Hip internal rotation    Hip external rotation    Knee flexion    Knee extension    Ankle dorsiflexion    Ankle plantarflexion    Ankle inversion    Ankle eversion     (Blank rows = not tested)    FUNCTIONAL TESTS:  SLS limited bilateral   Squat 3x minimal pain   GAIT: No gait deviations  TODAY'S TREATMENT:                                                                                                                              DATE: Access Code:  [email or web address] URL: [email or web address] Date: 12/18/2022 Prepared by: Lorayne Bender  Exercises - Modified Maisie Fus Stretch  - 1 x daily - 7 x weekly - 3 sets - 3 reps - 20 hold - Supine Bridge  - 1 x daily - 7 x weekly - 3 sets - 10 reps - Single Leg Bridge  - 1 x daily - 7 x weekly - 3 sets - 10 reps - Single Leg Stance with Diagonal Forward Reach  - 1 x daily - 7 x weekly - 3 sets - 10 reps - Side Stepping/ Forward stepping with band   - 1 x daily - 7 x weekly - 3 sets - 10 reps    PATIENT EDUCATION:  Education details: HEP, symptom management, progression of exercises, plan going forward. Person educated: Patient Education method: Explanation, Demonstration, Tactile cues, Verbal cues, and Handouts Education comprehension: verbalized understanding, returned demonstration, verbal cues required, tactile cues required, and needs further education  HOME EXERCISE PROGRAM: Access Code: [email or web address] URL: [email or web address] Date: 12/18/2022 Prepared by: Lorayne Bender  Exercises - Modified Maisie Fus Stretch  - 1 x daily - 7 x weekly - 3 sets - 3 reps - 20 hold - Supine Bridge  - 1 x daily - 7 x weekly - 3 sets - 10 reps - Single Leg Bridge  - 1 x daily - 7 x weekly - 3 sets - 10 reps - Single Leg Stance with Diagonal Forward Reach  - 1 x daily - 7 x weekly - 3 sets - 10 reps - Side Stepping/ Forward stepping with band   - 1 x daily - 7 x weekly - 3 sets - 10 reps  ASSESSMENT:  CLINICAL IMPRESSION: Patient is a 67 year old male with a progressive onset of left hip pain and pinching over the past year.  He reports difficulty putting on his shoes and with sitting.  He reports his hips get sore when he sits for too long.  He presents with increased pain with hip flexion past 90, internal rotation, and external rotation.  He has tenderness to palpation in his TFL.  He was given a base hip stability program to work on at home.  He is unsure  if he is going to come back for a  follow-up.  He will work on his exercises and call back as needed.  He also presents with decreased single-leg stability.  The avid runner.  We talked about improving that single-leg stability to improve his lateral movement when running.  This should help reduce his pain.  He plans to have his labrum repaired sometime in 2025.  He may also benefit from an aquatic visit.  Therapy will follow-up per patient.  OBJECTIVE IMPAIRMENTS: decreased activity tolerance, decreased ROM, increased muscle spasms, and pain.   ACTIVITY LIMITATIONS: sitting and dressing  PARTICIPATION LIMITATIONS: driving, community activity, and occupation  PERSONAL FACTORS: None  REHAB POTENTIAL: Good  CLINICAL DECISION MAKING: Stable/uncomplicated  EVALUATION COMPLEXITY: Low   GOALS: Goals reviewed with patient? Yes  STG equals LTG   LONG TERM GOALS: Target date: 02/12/2023    Patient will sit for greater than an hour at work without increased left hip pain Baseline:  Goal status: INITIAL  2.  Patient will put on his shoes without hip pain Baseline:  Goal status: INITIAL  3.  Patient will demonstrate 120 degrees of passive hip flexion in order to perform deep bending activities at home Baseline:  Goal status: INITIAL  PLAN:  PT FREQUENCY: 2x/week  PT DURATION: 8 weeks  PLANNED INTERVENTIONS: Therapeutic exercises, Therapeutic activity, Neuromuscular re-education, Balance training, Gait training, Patient/Family education, Self Care, Joint mobilization, Stair training, DME instructions, Aquatic Therapy, Dry Needling, Electrical stimulation, Cryotherapy, Moist heat, Taping, Manual therapy, and Re-evaluation.   PLAN FOR NEXT SESSION: If patient returns review home exercises.  Consider instability on Airex.  Consider monster walk.  Continue with manual therapy to anterior hip.  Consider inferior glide to improve flexion.  Consider progression into gym equipment if patient plans on going to the  gym.   Dessie Coma, PT 12/18/2022, 7:53 AM

## 2022-12-24 ENCOUNTER — Encounter: Payer: Self-pay | Admitting: Emergency Medicine

## 2022-12-24 ENCOUNTER — Ambulatory Visit (INDEPENDENT_AMBULATORY_CARE_PROVIDER_SITE_OTHER): Payer: Medicare HMO | Admitting: Emergency Medicine

## 2022-12-24 VITALS — BP 136/86 | HR 51 | Temp 97.7°F | Wt 202.0 lb

## 2022-12-24 DIAGNOSIS — Z1329 Encounter for screening for other suspected endocrine disorder: Secondary | ICD-10-CM

## 2022-12-24 DIAGNOSIS — E785 Hyperlipidemia, unspecified: Secondary | ICD-10-CM | POA: Diagnosis not present

## 2022-12-24 DIAGNOSIS — Z125 Encounter for screening for malignant neoplasm of prostate: Secondary | ICD-10-CM | POA: Diagnosis not present

## 2022-12-24 DIAGNOSIS — Z130 Encounter for screening for diseases of the blood and blood-forming organs and certain disorders involving the immune mechanism: Secondary | ICD-10-CM

## 2022-12-24 DIAGNOSIS — Z0000 Encounter for general adult medical examination without abnormal findings: Secondary | ICD-10-CM

## 2022-12-24 DIAGNOSIS — Z23 Encounter for immunization: Secondary | ICD-10-CM | POA: Diagnosis not present

## 2022-12-24 DIAGNOSIS — Z1211 Encounter for screening for malignant neoplasm of colon: Secondary | ICD-10-CM | POA: Diagnosis not present

## 2022-12-24 DIAGNOSIS — Z13228 Encounter for screening for other metabolic disorders: Secondary | ICD-10-CM | POA: Diagnosis not present

## 2022-12-24 DIAGNOSIS — Z8679 Personal history of other diseases of the circulatory system: Secondary | ICD-10-CM | POA: Diagnosis not present

## 2022-12-24 LAB — LIPID PANEL
Cholesterol: 164 mg/dL (ref 0–200)
HDL: 61 mg/dL (ref >39.00)
LDL Cholesterol: 86 mg/dL (ref 0–99)
NonHDL: 102.88
Total CHOL/HDL Ratio: 3
Triglycerides: 83 mg/dL (ref 0.0–149.0)
VLDL: 16.6 mg/dL (ref 0.0–40.0)

## 2022-12-24 LAB — COMPREHENSIVE METABOLIC PANEL
ALT: 24 U/L (ref 0–53)
AST: 23 U/L (ref 0–37)
Albumin: 4.2 g/dL (ref 3.5–5.2)
Alkaline Phosphatase: 94 U/L (ref 39–117)
BUN: 31 mg/dL — ABNORMAL HIGH (ref 6–23)
CO2: 30 meq/L (ref 19–32)
Calcium: 9.3 mg/dL (ref 8.4–10.5)
Chloride: 106 meq/L (ref 96–112)
Creatinine, Ser: 1.16 mg/dL (ref 0.40–1.50)
GFR: 64.93 mL/min (ref >60.00)
Glucose, Bld: 67 mg/dL — ABNORMAL LOW (ref 70–99)
Potassium: 3.9 meq/L (ref 3.5–5.1)
Sodium: 143 meq/L (ref 135–145)
Total Bilirubin: 0.6 mg/dL (ref 0.2–1.2)
Total Protein: 6.7 g/dL (ref 6.0–8.3)

## 2022-12-24 LAB — CBC WITH DIFFERENTIAL/PLATELET
Basophils Absolute: 0.1 10*3/uL (ref 0.0–0.1)
Basophils Relative: 0.9 % (ref 0.0–3.0)
Eosinophils Absolute: 0.1 10*3/uL (ref 0.0–0.7)
Eosinophils Relative: 1.5 % (ref 0.0–5.0)
HCT: 42.9 % (ref 39.0–52.0)
Hemoglobin: 14.5 g/dL (ref 13.0–17.0)
Lymphocytes Relative: 34.8 % (ref 12.0–46.0)
Lymphs Abs: 3 10*3/uL (ref 0.7–4.0)
MCHC: 33.8 g/dL (ref 30.0–36.0)
MCV: 91.7 fl (ref 78.0–100.0)
Monocytes Absolute: 0.9 10*3/uL (ref 0.1–1.0)
Monocytes Relative: 9.9 % (ref 3.0–12.0)
Neutro Abs: 4.6 10*3/uL (ref 1.4–7.7)
Neutrophils Relative %: 52.9 % (ref 43.0–77.0)
Platelets: 187 10*3/uL (ref 150.0–400.0)
RBC: 4.68 Mil/uL (ref 4.22–5.81)
RDW: 13.7 % (ref 11.5–15.5)
WBC: 8.7 10*3/uL (ref 4.0–10.5)

## 2022-12-24 LAB — PSA: PSA: 1.06 ng/mL (ref 0.10–4.00)

## 2022-12-24 NOTE — Patient Instructions (Signed)
Health Maintenance, Male Adopting a healthy lifestyle and getting preventive care are important in promoting health and wellness. Ask your health care provider about: The right schedule for you to have regular tests and exams. Things you can do on your own to prevent diseases and keep yourself healthy. What should I know about diet, weight, and exercise? Eat a healthy diet  Eat a diet that includes plenty of vegetables, fruits, low-fat dairy products, and lean protein. Do not eat a lot of foods that are high in solid fats, added sugars, or sodium. Maintain a healthy weight Body mass index (BMI) is a measurement that can be used to identify possible weight problems. It estimates body fat based on height and weight. Your health care provider can help determine your BMI and help you achieve or maintain a healthy weight. Get regular exercise Get regular exercise. This is one of the most important things you can do for your health. Most adults should: Exercise for at least 150 minutes each week. The exercise should increase your heart rate and make you sweat (moderate-intensity exercise). Do strengthening exercises at least twice a week. This is in addition to the moderate-intensity exercise. Spend less time sitting. Even light physical activity can be beneficial. Watch cholesterol and blood lipids Have your blood tested for lipids and cholesterol at 68 years of age, then have this test every 5 years. You may need to have your cholesterol levels checked more often if: Your lipid or cholesterol levels are high. You are older than 68 years of age. You are at high risk for heart disease. What should I know about cancer screening? Many types of cancers can be detected early and may often be prevented. Depending on your health history and family history, you may need to have cancer screening at various ages. This may include screening for: Colorectal cancer. Prostate cancer. Skin cancer. Lung  cancer. What should I know about heart disease, diabetes, and high blood pressure? Blood pressure and heart disease High blood pressure causes heart disease and increases the risk of stroke. This is more likely to develop in people who have high blood pressure readings or are overweight. Talk with your health care provider about your target blood pressure readings. Have your blood pressure checked: Every 3-5 years if you are 18-39 years of age. Every year if you are 40 years old or older. If you are between the ages of 65 and 75 and are a current or former smoker, ask your health care provider if you should have a one-time screening for abdominal aortic aneurysm (AAA). Diabetes Have regular diabetes screenings. This checks your fasting blood sugar level. Have the screening done: Once every three years after age 45 if you are at a normal weight and have a low risk for diabetes. More often and at a younger age if you are overweight or have a high risk for diabetes. What should I know about preventing infection? Hepatitis B If you have a higher risk for hepatitis B, you should be screened for this virus. Talk with your health care provider to find out if you are at risk for hepatitis B infection. Hepatitis C Blood testing is recommended for: Everyone born from 1945 through 1965. Anyone with known risk factors for hepatitis C. Sexually transmitted infections (STIs) You should be screened each year for STIs, including gonorrhea and chlamydia, if: You are sexually active and are younger than 68 years of age. You are older than 68 years of age and your   health care provider tells you that you are at risk for this type of infection. Your sexual activity has changed since you were last screened, and you are at increased risk for chlamydia or gonorrhea. Ask your health care provider if you are at risk. Ask your health care provider about whether you are at high risk for HIV. Your health care provider  may recommend a prescription medicine to help prevent HIV infection. If you choose to take medicine to prevent HIV, you should first get tested for HIV. You should then be tested every 3 months for as long as you are taking the medicine. Follow these instructions at home: Alcohol use Do not drink alcohol if your health care provider tells you not to drink. If you drink alcohol: Limit how much you have to 0-2 drinks a day. Know how much alcohol is in your drink. In the [place] one drink equals one 12 oz bottle of beer (355 mL), one 5 oz glass of wine (148 mL), or one 1 oz glass of hard liquor (44 mL). Lifestyle Do not use any products that contain nicotine or tobacco. These products include cigarettes, chewing tobacco, and vaping devices, such as e-cigarettes. If you need help quitting, ask your health care provider. Do not use street drugs. Do not share needles. Ask your health care provider for help if you need support or information about quitting drugs. General instructions Schedule regular health, dental, and eye exams. Stay current with your vaccines. Tell your health care provider if: You often feel depressed. You have ever been abused or do not feel safe at home. Summary Adopting a healthy lifestyle and getting preventive care are important in promoting health and wellness. Follow your health care provider's instructions about healthy diet, exercising, and getting tested or screened for diseases. Follow your health care provider's instructions on monitoring your cholesterol and blood pressure. This information is not intended to replace advice given to you by your health care provider. Make sure you discuss any questions you have with your health care provider. Document Revised: 08/22/2020 Document Reviewed: 08/22/2020 Elsevier Patient Education  2024 [employer]  

## 2022-12-24 NOTE — Progress Notes (Signed)
Johnny Boyer 67 y.o.   Chief Complaint  Patient presents with   Annual Exam    Physical    HISTORY OF PRESENT ILLNESS: This is a 67 y.o. male here for annual exam History of coronary artery disease, stented about 15 years ago, takes daily baby aspirin along with atorvastatin 80 mg Has not seen a cardiologist in a while.  Requesting referral. Non-smoker.  Chronic runner.  Denies chest pain or shortness of breath with exertion.  HPI   Prior to Admission medications   Medication Sig Start Date End Date Taking? Authorizing Provider  aspirin EC 81 MG tablet Take 81 mg by mouth daily.   Yes [provider]  atorvastatin (LIPITOR) 80 MG tablet TAKE 1 TABLET BY MOUTH EVERY DAY 05/31/22  Yes Georgina Quint, MD    No Known Allergies  Patient Active Problem List   Diagnosis Date Noted   History of coronary artery disease 12/15/2019   External hemorrhoids 12/15/2019   Dyslipidemia 12/15/2019    Past Medical History:  Diagnosis Date   Hemorrhoids    Hyperlipidemia    Myocardial infarction (HCC) 2009   Sleep apnea    no cpap used did not tolerate, moderate osa    Past Surgical History:  Procedure Laterality Date   CARDIAC CATHETERIZATION  2009   stents to mid rca x 2   EVALUATION UNDER ANESTHESIA WITH HEMORRHOIDECTOMY N/A 04/21/2020   Procedure: HEMORRHOIDECTOMY X3 WITH LIGATION AND HEMORRHOIDOPEXY ANORECTAL EXAMINATION UNDER ANESTHESIA;  Surgeon: Karie Soda, MD;  Location: [facility];  Service: General;  Laterality: N/A;   FRACTURE SURGERY  1974   right wrist   SPINE SURGERY  2008 and 2009   cervical C 3 C4 C5 fusion    Social History   Socioeconomic History   Marital status: Married    Spouse name: Not on file   Number of children: Not on file   Years of education: Not on file   Highest education level: Doctorate  Occupational History   Not on file  Tobacco Use   Smoking status: Never   Smokeless tobacco: Never  Vaping Use    Vaping status: Never Used  Substance and Sexual Activity   Alcohol use: Yes    Comment: 1 or 2 beers per day   Drug use: Never   Sexual activity: Not on file  Other Topics Concern   Not on file  Social History Narrative   Not on file   Social Determinants of Health   Financial Resource Strain: Low Risk  (10/15/2022)   Overall Financial Resource Strain (CARDIA)    Difficulty of Paying Living Expenses: Not hard at all  Food Insecurity: No Food Insecurity (10/15/2022)   Hunger Vital Sign    Worried About Running Out of Food in the Last Year: Never true    Ran Out of Food in the Last Year: Never true  Transportation Needs: No Transportation Needs (10/15/2022)   PRAPARE - Administrator, Civil Service (Medical): No    Lack of Transportation (Non-Medical): No  Physical Activity: Sufficiently Active (10/15/2022)   Exercise Vital Sign    Days of Exercise per Week: 5 days    Minutes of Exercise per Session: 40 min  Stress: No Stress Concern Present (10/15/2022)   [employer] of Occupational Health - Occupational Stress Questionnaire    Feeling of Stress : Not at all  Social Connections: Moderately Integrated (10/15/2022)   Social Connection and Isolation Panel [NHANES]  Frequency of Communication with Friends and Family: Once a week    Frequency of Social Gatherings with Friends and Family: More than three times a week    Attends Religious Services: Never    Database administrator or Organizations: Yes    Attends Engineer, structural: More than 4 times per year    Marital Status: Married  Catering manager Violence: Not At Risk (10/15/2022)   Humiliation, Afraid, Rape, and Kick questionnaire    Fear of Current or Ex-Partner: No    Emotionally Abused: No    Physically Abused: No    Sexually Abused: No    Family History  Problem Relation Age of Onset   Heart disease Mother    Hyperlipidemia Mother    Hyperlipidemia Sister    Hypertension Sister    Hyperlipidemia  Brother    Hypertension Brother      Review of Systems  Constitutional: Negative.  Negative for chills and fever.  HENT: Negative.  Negative for congestion and sore throat.   Respiratory: Negative.  Negative for cough and shortness of breath.   Cardiovascular: Negative.  Negative for chest pain and palpitations.  Gastrointestinal:  Negative for abdominal pain, diarrhea, nausea and vomiting.  Genitourinary: Negative.  Negative for dysuria and hematuria.  Skin: Negative.  Negative for rash.  Neurological: Negative.  Negative for dizziness and headaches.    Vitals:   12/24/22 1540  BP: 136/86  Pulse: (!) 51  Temp: 97.7 F (36.5 C)  SpO2: 97%    Physical Exam Vitals reviewed.  Constitutional:      Appearance: Normal appearance.  HENT:     Head: Normocephalic.     Right Ear: Tympanic membrane, ear canal and external ear normal.     Left Ear: Tympanic membrane, ear canal and external ear normal.     Mouth/Throat:     Mouth: Mucous membranes are moist.     Pharynx: Oropharynx is clear.  Eyes:     Extraocular Movements: Extraocular movements intact.     Conjunctiva/sclera: Conjunctivae normal.     Pupils: Pupils are equal, round, and reactive to light.  Cardiovascular:     Rate and Rhythm: Normal rate and regular rhythm.     Pulses: Normal pulses.     Heart sounds: Normal heart sounds.  Pulmonary:     Effort: Pulmonary effort is normal.     Breath sounds: Normal breath sounds.  Abdominal:     Palpations: Abdomen is soft.     Tenderness: There is no abdominal tenderness.  Musculoskeletal:     Cervical back: No tenderness.  Lymphadenopathy:     Cervical: No cervical adenopathy.  Skin:    General: Skin is warm and dry.     Capillary Refill: Capillary refill takes less than 2 seconds.  Neurological:     General: No focal deficit present.     Mental Status: He is alert and oriented to person, place, and time.  Psychiatric:        Mood and Affect: Mood normal.         Behavior: Behavior normal.      ASSESSMENT & PLAN: Problem List Items Addressed This Visit       Other   History of coronary artery disease   Relevant Orders   Ambulatory referral to Cardiology   Dyslipidemia   Relevant Orders   Comprehensive metabolic panel   Lipid panel   Ambulatory referral to Cardiology   Other Visit Diagnoses  Routine general medical examination at a health care facility    -  Primary   Relevant Orders   CBC with Differential   Comprehensive metabolic panel   Hemoglobin A1c   Lipid panel   PSA(Must document that pt has been informed of limitations of PSA testing.)   Cologuard   Screening for prostate cancer       Relevant Orders   PSA(Must document that pt has been informed of limitations of PSA testing.)   Colon cancer screening       Relevant Orders   Cologuard   Screening for deficiency anemia       Relevant Orders   CBC with Differential   Screening for endocrine, metabolic and immunity disorder       Relevant Orders   Comprehensive metabolic panel   Hemoglobin A1c   Need for vaccination       Relevant Orders   Flu vaccine trivalent PF, 6mos and older(Flulaval,Afluria,Fluarix,Fluzone) (Completed)      Modifiable risk factors discussed with patient. Anticipatory guidance according to age provided. The following topics were also discussed: Social Determinants of Health Smoking.  Non-smoker Diet and nutrition Benefits of exercise Cancer screening and need for colon cancer screening with Cologuard Vaccinations review and recommendations Cardiovascular risk assessment The 10-year ASCVD risk score (Arnett DK, et al., 2019) is: 15%   Values used to calculate the score:     Age: 29 years     Sex: Male     Is Non-Hispanic African American: No     Diabetic: No     Tobacco smoker: No     Systolic Blood Pressure: 136 mmHg     Is BP treated: No     HDL Cholesterol: 59 mg/dL     Total Cholesterol: 176 mg/dL  Mental health including  depression and anxiety Fall and accident prevention  Patient Instructions  Health Maintenance, Male Adopting a healthy lifestyle and getting preventive care are important in promoting health and wellness. Ask your health care provider about: The right schedule for you to have regular tests and exams. Things you can do on your own to prevent diseases and keep yourself healthy. What should I know about diet, weight, and exercise? Eat a healthy diet  Eat a diet that includes plenty of vegetables, fruits, low-fat dairy products, and lean protein. Do not eat a lot of foods that are high in solid fats, added sugars, or sodium. Maintain a healthy weight Body mass index (BMI) is a measurement that can be used to identify possible weight problems. It estimates body fat based on height and weight. Your health care provider can help determine your BMI and help you achieve or maintain a healthy weight. Get regular exercise Get regular exercise. This is one of the most important things you can do for your health. Most adults should: Exercise for at least 150 minutes each week. The exercise should increase your heart rate and make you sweat (moderate-intensity exercise). Do strengthening exercises at least twice a week. This is in addition to the moderate-intensity exercise. Spend less time sitting. Even light physical activity can be beneficial. Watch cholesterol and blood lipids Have your blood tested for lipids and cholesterol at 68 years of age, then have this test every 5 years. You may need to have your cholesterol levels checked more often if: Your lipid or cholesterol levels are high. You are older than 68 years of age. You are at high risk for heart disease. What should I know  about cancer screening? Many types of cancers can be detected early and may often be prevented. Depending on your health history and family history, you may need to have cancer screening at various ages. This may include  screening for: Colorectal cancer. Prostate cancer. Skin cancer. Lung cancer. What should I know about heart disease, diabetes, and high blood pressure? Blood pressure and heart disease High blood pressure causes heart disease and increases the risk of stroke. This is more likely to develop in people who have high blood pressure readings or are overweight. Talk with your health care provider about your target blood pressure readings. Have your blood pressure checked: Every 3-5 years if you are 68-76 years of age. Every year if you are 47 years old or older. If you are between the ages of 59 and 67 and are a current or former smoker, ask your health care provider if you should have a one-time screening for abdominal aortic aneurysm (AAA). Diabetes Have regular diabetes screenings. This checks your fasting blood sugar level. Have the screening done: Once every three years after age 24 if you are at a normal weight and have a low risk for diabetes. More often and at a younger age if you are overweight or have a high risk for diabetes. What should I know about preventing infection? Hepatitis B If you have a higher risk for hepatitis B, you should be screened for this virus. Talk with your health care provider to find out if you are at risk for hepatitis B infection. Hepatitis C Blood testing is recommended for: Everyone born from [birth year] through 1966. Anyone with known risk factors for hepatitis C. Sexually transmitted infections (STIs) You should be screened each year for STIs, including gonorrhea and chlamydia, if: You are sexually active and are younger than 68 years of age. You are older than 68 years of age and your health care provider tells you that you are at risk for this type of infection. Your sexual activity has changed since you were last screened, and you are at increased risk for chlamydia or gonorrhea. Ask your health care provider if you are at risk. Ask your health care  provider about whether you are at high risk for HIV. Your health care provider may recommend a prescription medicine to help prevent HIV infection. If you choose to take medicine to prevent HIV, you should first get tested for HIV. You should then be tested every 3 months for as long as you are taking the medicine. Follow these instructions at home: Alcohol use Do not drink alcohol if your health care provider tells you not to drink. If you drink alcohol: Limit how much you have to 0-2 drinks a day. Know how much alcohol is in your drink. In the [place] one drink equals one 12 oz bottle of beer (355 mL), one 5 oz glass of wine (148 mL), or one 1 oz glass of hard liquor (44 mL). Lifestyle Do not use any products that contain nicotine or tobacco. These products include cigarettes, chewing tobacco, and vaping devices, such as e-cigarettes. If you need help quitting, ask your health care provider. Do not use street drugs. Do not share needles. Ask your health care provider for help if you need support or information about quitting drugs. General instructions Schedule regular health, dental, and eye exams. Stay current with your vaccines. Tell your health care provider if: You often feel depressed. You have ever been abused or do not feel safe at home. Summary  Adopting a healthy lifestyle and getting preventive care are important in promoting health and wellness. Follow your health care provider's instructions about healthy diet, exercising, and getting tested or screened for diseases. Follow your health care provider's instructions on monitoring your cholesterol and blood pressure. This information is not intended to replace advice given to you by your health care provider. Make sure you discuss any questions you have with your health care provider. Document Revised: 08/22/2020 Document Reviewed: 08/22/2020 Elsevier Patient Education  2024 Elsevier Inc.      Edwina Barth, MD [place]  Primary Care at [facility]

## 2022-12-25 LAB — HEMOGLOBIN A1C: Hgb A1c MFr Bld: 5.6 % (ref 4.6–6.5)

## 2022-12-31 ENCOUNTER — Encounter: Payer: Self-pay | Admitting: Emergency Medicine

## 2022-12-31 ENCOUNTER — Ambulatory Visit
Admission: EM | Admit: 2022-12-31 | Discharge: 2022-12-31 | Disposition: A | Discharge status: Home / Self Care | Payer: Medicare HMO | Attending: Emergency Medicine | Admitting: Emergency Medicine

## 2022-12-31 DIAGNOSIS — S8391XA Sprain of unspecified site of right knee, initial encounter: Secondary | ICD-10-CM | POA: Diagnosis not present

## 2022-12-31 DIAGNOSIS — S838X1A Sprain of other specified parts of right knee, initial encounter: Secondary | ICD-10-CM | POA: Diagnosis not present

## 2022-12-31 NOTE — Discharge Instructions (Signed)
Wear brace provided. Avoid running and limit other activities that worsen the pain. Continue with Tylenol and/or ibuprofen and can also try icing. Concern for possible meniscus injury. Follow-up with orthopedics or sports medicine for further evaluation and treatment.

## 2022-12-31 NOTE — ED Provider Notes (Signed)
[facility]    CSN: [id number] Arrival date & time: 12/31/22  1503      History   Chief Complaint Chief Complaint  Patient presents with   Knee Pain    HPI Johnny Boyer is a 67 y.o. male.   Patient presents with concerns of right knee pain. He states yesterday morning while running he felt sharp pain in his right knee. The pain has continued since then, worse with walking or knee movements. He denies pain at rest. The patient denies any fall or blunt trauma to the knee. He denies prior similar. He has been taking ibuprofen with minimal improvement. He denies radiating pain into the lower leg or numbness/tingling.   The history is provided by the patient.  Knee Pain Associated symptoms: no fatigue and no fever     Past Medical History:  Diagnosis Date   Hemorrhoids    Hyperlipidemia    Myocardial infarction (HCC) 2009   Sleep apnea    no cpap used did not tolerate, moderate osa    Patient Active Problem List   Diagnosis Date Noted   History of coronary artery disease 12/15/2019   External hemorrhoids 12/15/2019   Dyslipidemia 12/15/2019    Past Surgical History:  Procedure Laterality Date   CARDIAC CATHETERIZATION  2009   stents to mid rca x 2   EVALUATION UNDER ANESTHESIA WITH HEMORRHOIDECTOMY N/A 04/21/2020   Procedure: HEMORRHOIDECTOMY X3 WITH LIGATION AND HEMORRHOIDOPEXY ANORECTAL EXAMINATION UNDER ANESTHESIA;  Surgeon: Karie Soda, MD;  Location: [facility];  Service: General;  Laterality: N/A;   FRACTURE SURGERY  1974   right wrist   SPINE SURGERY  2008 and 2009   cervical C 3 C4 C5 fusion       Home Medications    Prior to Admission medications   Medication Sig Start Date End Date Taking? Authorizing Provider  aspirin EC 81 MG tablet Take 81 mg by mouth daily.    [provider]  atorvastatin (LIPITOR) 80 MG tablet TAKE 1 TABLET BY MOUTH EVERY DAY 05/31/22   Georgina Quint, MD    Family History Family  History  Problem Relation Age of Onset   Heart disease Mother    Hyperlipidemia Mother    Hyperlipidemia Sister    Hypertension Sister    Hyperlipidemia Brother    Hypertension Brother     Social History Social History   Tobacco Use   Smoking status: Never   Smokeless tobacco: Never  Vaping Use   Vaping status: Never Used  Substance Use Topics   Alcohol use: Yes    Comment: 1 or 2 beers per day   Drug use: Never     Allergies   Patient has no known allergies.   Review of Systems Review of Systems  Constitutional:  Negative for fatigue and fever.  Musculoskeletal:  Positive for arthralgias and gait problem. Negative for joint swelling and myalgias.  Skin:  Negative for color change, rash and wound.  Neurological:  Negative for weakness and numbness.     Physical Exam Triage Vital Signs ED Triage Vitals [12/31/22 1544]  Encounter Vitals Group     BP 135/88     Systolic BP Percentile      Diastolic BP Percentile      Pulse Rate (!) 51     Resp 16     Temp 98 F (36.7 C)     Temp Source Oral     SpO2 96 %  Weight      Height      Head Circumference      Peak Flow      Pain Score 6     Pain Loc      Pain Education      Exclude from Growth Chart    No data found.  Updated Vital Signs BP 135/88 (BP Location: Right Arm)   Pulse (!) 51   Temp 98 F (36.7 C) (Oral)   Resp 16   SpO2 96%   Visual Acuity Right Eye Distance:   Left Eye Distance:   Bilateral Distance:    Right Eye Near:   Left Eye Near:    Bilateral Near:     Physical Exam Vitals and nursing note reviewed.  Constitutional:      General: He is not in acute distress. HENT:     Head: Normocephalic.  Cardiovascular:     Pulses: Normal pulses.  Pulmonary:     Effort: Pulmonary effort is normal.  Musculoskeletal:     Right knee: No swelling, effusion or ecchymosis. Tenderness present.     Instability Tests: Anterior drawer test negative. Posterior drawer test negative. Medial  McMurray test positive. Lateral McMurray test negative.     Comments: Tenderness to soft tissue inferomedial to right patella, not extending medially to joint line. No patellar tenderness. Full ROM. Reports pain reproduced with tibial lateral rotation.  Skin:    Findings: No bruising, erythema or rash.  Neurological:     Mental Status: He is alert.     Gait: Gait abnormal (antalgic favoring right).  Psychiatric:        Mood and Affect: Mood normal.      UC Treatments / Results  Labs (all labs ordered are listed, but only abnormal results are displayed) Labs Reviewed - No data to display  EKG   Radiology No results found.  Procedures Procedures (including critical care time)  Medications Ordered in UC Medications - No data to display  Initial Impression / Assessment and Plan / UC Course  I have reviewed the triage vital signs and the nursing notes.  Pertinent labs & imaging results that were available during my care of the patient were reviewed by me and considered in my medical decision making (see chart for details).     Ddx includes knee sprain, meniscal injury, arthritis. Discussed with pt concern for meniscal injury given location and reproduced primarily with rotational movement. Supportive brace and sx tx and follow-up with ortho.  E/M: 1 acute uncomplicated illness, no data, low risk  Final Clinical Impressions(s) / UC Diagnoses   Final diagnoses:  Sprain of right knee, unspecified ligament, initial encounter     Discharge Instructions      Wear brace provided. Avoid running and limit other activities that worsen the pain. Continue with Tylenol and/or ibuprofen and can also try icing. Concern for possible meniscus injury. Follow-up with orthopedics or sports medicine for further evaluation and treatment.     ED Prescriptions   None    PDMP not reviewed this encounter.   Estanislado Pandy, [place] 12/31/22 [phone]

## 2022-12-31 NOTE — ED Triage Notes (Signed)
Pt presents to UC w/ c/o right knee pain since yesterday morning during his run. Pt states he was on his a run and "felt a tweak in his right knee." Pt is limping. Denies falling recently or direct injury. Ibuprofen this morning gives some relief.

## 2023-01-07 ENCOUNTER — Encounter (HOSPITAL_BASED_OUTPATIENT_CLINIC_OR_DEPARTMENT_OTHER): Payer: Self-pay | Admitting: Physical Therapy

## 2023-01-10 DIAGNOSIS — Z1211 Encounter for screening for malignant neoplasm of colon: Secondary | ICD-10-CM | POA: Diagnosis not present

## 2023-01-16 LAB — COLOGUARD: COLOGUARD: NEGATIVE

## 2023-02-01 ENCOUNTER — Ambulatory Visit: Payer: Medicare HMO | Admitting: Cardiology

## 2023-02-04 ENCOUNTER — Encounter: Payer: Self-pay | Admitting: Emergency Medicine

## 2023-02-05 ENCOUNTER — Other Ambulatory Visit: Payer: Self-pay | Admitting: Emergency Medicine

## 2023-02-05 DIAGNOSIS — U071 COVID-19: Secondary | ICD-10-CM

## 2023-02-05 MED ORDER — NIRMATRELVIR/RITONAVIR (PAXLOVID)TABLET
3.0000 | ORAL_TABLET | Freq: Two times a day (BID) | ORAL | 0 refills | Status: AC
Start: 2023-02-05 — End: 2023-02-10

## 2023-02-05 NOTE — Telephone Encounter (Signed)
New prescription for Paxlovid sent to pharmacy of record today.  Thanks.

## 2023-02-08 ENCOUNTER — Ambulatory Visit: Payer: Medicare HMO | Admitting: Cardiology

## 2023-02-12 ENCOUNTER — Ambulatory Visit: Payer: Medicare HMO | Admitting: Cardiovascular Disease

## 2023-02-15 ENCOUNTER — Ambulatory Visit (HOSPITAL_BASED_OUTPATIENT_CLINIC_OR_DEPARTMENT_OTHER): Payer: Medicare HMO | Admitting: Orthopaedic Surgery

## 2023-02-15 ENCOUNTER — Other Ambulatory Visit: Payer: Self-pay | Admitting: Emergency Medicine

## 2023-02-15 ENCOUNTER — Ambulatory Visit (HOSPITAL_BASED_OUTPATIENT_CLINIC_OR_DEPARTMENT_OTHER): Payer: Self-pay | Admitting: Orthopaedic Surgery

## 2023-02-15 DIAGNOSIS — M25852 Other specified joint disorders, left hip: Secondary | ICD-10-CM | POA: Diagnosis not present

## 2023-02-15 DIAGNOSIS — E785 Hyperlipidemia, unspecified: Secondary | ICD-10-CM

## 2023-02-15 NOTE — Progress Notes (Addendum)
Chief Complaint: Left hip pain     History of Present Illness:   02/15/2023: Presents today for follow-up of his left hip.  He has been working in physical therapy with limited relief at this point.  Johnny Boyer is a 67 y.o. male presents with ongoing left hip pain that has been worsening over the course of this year.  He has previously been seen by Dr. Denyse Amass as well as Dr. Karie Schwalbe.  He has been experiencing a pinch in his groin predominantly with putting socks on or sitting with the leg figure-of-four.  He is a very avid runner and does enjoy running.  He denies significant pain while running predominantly only with terminal range of motion.  He did have an injection with Dr. Denyse Amass which did give him significant relief but is subsequently worn off.  He is here today for further discussion.    Surgical History:   None  PMH/PSH/Family History/Social History/Meds/Allergies:    Past Medical History:  Diagnosis Date   Hemorrhoids    Hyperlipidemia    Myocardial infarction (HCC) 2009   Sleep apnea    no cpap used did not tolerate, moderate osa   Past Surgical History:  Procedure Laterality Date   CARDIAC CATHETERIZATION  2009   stents to mid rca x 2   EVALUATION UNDER ANESTHESIA WITH HEMORRHOIDECTOMY N/A 04/21/2020   Procedure: HEMORRHOIDECTOMY X3 WITH LIGATION AND HEMORRHOIDOPEXY ANORECTAL EXAMINATION UNDER ANESTHESIA;  Surgeon: Karie Soda, MD;  Location: [facility];  Service: General;  Laterality: N/A;   FRACTURE SURGERY  1974   right wrist   SPINE SURGERY  2008 and 2009   cervical C 3 C4 C5 fusion   Social History   Socioeconomic History   Marital status: Married    Spouse name: Not on file   Number of children: Not on file   Years of education: Not on file   Highest education level: Doctorate  Occupational History   Not on file  Tobacco Use   Smoking status: Never   Smokeless tobacco: Never  Vaping Use   Vaping  status: Never Used  Substance and Sexual Activity   Alcohol use: Yes    Comment: 1 or 2 beers per day   Drug use: Never   Sexual activity: Not on file  Other Topics Concern   Not on file  Social History Narrative   Not on file   Social Determinants of Health   Financial Resource Strain: Low Risk  (10/15/2022)   Overall Financial Resource Strain (CARDIA)    Difficulty of Paying Living Expenses: Not hard at all  Food Insecurity: No Food Insecurity (10/15/2022)   Hunger Vital Sign    Worried About Running Out of Food in the Last Year: Never true    Ran Out of Food in the Last Year: Never true  Transportation Needs: No Transportation Needs (10/15/2022)   PRAPARE - Administrator, Civil Service (Medical): No    Lack of Transportation (Non-Medical): No  Physical Activity: Sufficiently Active (10/15/2022)   Exercise Vital Sign    Days of Exercise per Week: 5 days    Minutes of Exercise per Session: 40 min  Stress: No Stress Concern Present (10/15/2022)   [employer] of Occupational Health - Occupational Stress Questionnaire    Feeling of  Stress : Not at all  Social Connections: Moderately Integrated (10/15/2022)   Social Connection and Isolation Panel [NHANES]    Frequency of Communication with Friends and Family: Once a week    Frequency of Social Gatherings with Friends and Family: More than three times a week    Attends Religious Services: Never    Database administrator or Organizations: Yes    Attends Engineer, structural: More than 4 times per year    Marital Status: Married   Family History  Problem Relation Age of Onset   Heart disease Mother    Hyperlipidemia Mother    Hyperlipidemia Sister    Hypertension Sister    Hyperlipidemia Brother    Hypertension Brother    No Known Allergies Current Outpatient Medications  Medication Sig Dispense Refill   aspirin EC 81 MG tablet Take 81 mg by mouth daily.     atorvastatin (LIPITOR) 80 MG tablet TAKE 1  TABLET BY MOUTH EVERY DAY 90 tablet 2   No current facility-administered medications for this visit.   No results found.  Review of Systems:   A ROS was performed including pertinent positives and negatives as documented in the HPI.  Physical Exam :   Constitutional: NAD and appears stated age Neurological: Alert and oriented Psych: Appropriate affect and cooperative There were no vitals taken for this visit.   Comprehensive Musculoskeletal Exam:    Inspection Right Left  Skin No atrophy or gross abnormalities appreciated No atrophy or gross abnormalities appreciated  Palpation    Tenderness None MRI acetabular  Crepitus None None  Range of Motion    Flexion (passive) 120 120  Extension 30 30  IR 30 30 with pain  ER 45 45  Strength    Flexion  5/5 5/5  Extension 5/5 5/5  Special Tests    FABER Negative Negative  FADIR Negative Negative  ER Lag/Capsular Insufficiency Negative Negative  Instability Negative Negative  Sacroiliac pain Negative  Negative   Instability    Generalized Laxity No No  Neurologic    sciatic, femoral, obturator nerves intact to light sensation  Vascular/Lymphatic    DP pulse 2+ 2+  Lumbar Exam    Patient has symmetric lumbar range of motion with negative pain referral to hip     Imaging:   Xray (4 views left hip): Significant CAM impingement with maintained femoracetabular joint  MRI (left hip): Alpha angle of 65 degrees with labral tear  I personally reviewed and interpreted the radiographs.   Assessment:   67 y.o. 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  This is bothersome at terminal motion.  I did discuss that he does have quite a complex hip.  He is quite active enjoys running.  I did discuss radiographically he has minimal Tonnis grade 1 arthritic findings and as result he would be potentially a candidate for hip arthroscopy.  I did also discuss that the hip arthroplasty would likely give  him a permanent fix but this is somewhat complicated by the fact that he is a runner and would likely put an implant to multiple cycles.  At this time he is still very active and is still working.  To that effect I am concerned about premature wear of an arthroplasty.  Given that we did further discuss hip arthroscopy/cam debridement and labral repair.  I did discuss the role of physical therapy and the fact that he is not getting relief from this.  At  this time I did discuss options including left hip arthroscopy with cam debridement and labral repair versus total hip arthroplasty.  I did discuss the risks and limitations associated with each.  After discussion he is ultimately elected for more of the hip preservation approach.  He is aware that this may not result in permanent solution for the hip and may result in eventual hip arthroplasty  Plan :    -Plan for left hip arthroscopy with labral repair and cam debridement   After a lengthy discussion of treatment options, including risks, benefits, alternatives, complications of surgical and nonsurgical conservative options, the patient elected surgical repair.   The patient  is aware of the material risks  and complications including, but not limited to injury to adjacent structures, neurovascular injury, infection, numbness, bleeding, implant failure, thermal burns, stiffness, persistent pain, failure to heal, disease transmission from allograft, need for further surgery, dislocation, anesthetic risks, blood clots, risks of death,and others. The probabilities of surgical success and failure discussed with patient given their particular co-morbidities.The time and nature of expected rehabilitation and recovery was discussed.The patient's questions were all answered preoperatively.  No barriers to understanding were noted. I explained the natural history of the disease process and Rx rationale.  I explained to the patient what I considered to be reasonable  expectations given their personal situation.  The final treatment plan was arrived at through a shared patient decision making process model.      I personally saw and evaluated the patient, and participated in the management and treatment plan.  Huel Cote, MD Attending Physician, Orthopedic Surgery  This document was dictated using Dragon voice recognition software. A reasonable attempt at proof reading has been made to minimize errors.

## 2023-02-18 ENCOUNTER — Telehealth: Payer: Self-pay | Admitting: Emergency Medicine

## 2023-02-18 ENCOUNTER — Encounter (HOSPITAL_BASED_OUTPATIENT_CLINIC_OR_DEPARTMENT_OTHER): Payer: Self-pay | Admitting: Orthopaedic Surgery

## 2023-02-18 NOTE — Telephone Encounter (Signed)
Surgical clearance received via fax from [facility] & placed in provider's box up front. They are unable to schedule surgery until paperwork is completed and faxed back to April B at [phone]

## 2023-02-20 ENCOUNTER — Telehealth: Payer: Self-pay | Admitting: *Deleted

## 2023-02-20 NOTE — Telephone Encounter (Signed)
   Pre-operative Risk Assessment    Patient Name: Johnny Boyer  DOB: 06/27/1955 MRN: [id number]  DATE OF LAST VISIT: NONE  DATE OF NEXT VISIT: 03/22/23 DR. PATWARDHAN    Request for Surgical Clearance    Procedure:   LEFT HIP ARTHROSCOPY WITH LABRAL REPAIR AND CAM DEBRIDEMENT  Date of Surgery:  Clearance TBD                                 Surgeon:  DR. Huel Cote Surgeon's Group or Practice Name:  [facility] AT [facility] Phone number:  [phone] Fax number:  [phone] ATTN:APRIL    Type of Clearance Requested:   - Medical ; ASA    Type of Anesthesia:  General    Additional requests/questions:    [name]   02/20/2023, 3:44 PM

## 2023-02-20 NOTE — Telephone Encounter (Signed)
   Name: Johnny Boyer  DOB: [birth year]  MRN: [id number]  Primary Cardiologist: None  Chart reviewed as part of pre-operative protocol coverage. The patient has an upcoming visit scheduled with Dr. Enid Derry on 03/22/23 at which time clearance can be addressed in case there are any issues that would impact surgical recommendations.   I added preop FYI to appointment note so that provider is aware to address at time of outpatient visit.  Per office protocol the cardiology provider should forward their finalized clearance decision and recommendations regarding antiplatelet therapy to the requesting party below.     I will route this message as FYI to requesting party and remove this message from the preop box as separate preop APP input not needed at this time.   Please call with any questions.  Napoleon Form, Leodis Rains, NP  02/20/2023, 3:57 PM

## 2023-02-20 NOTE — Telephone Encounter (Signed)
Called [facility] back. Patient needs a cardiac clearance before his medical clearance. April will send a surgical clearance to patient cardiologist.

## 2023-03-01 ENCOUNTER — Encounter: Payer: Self-pay | Admitting: Emergency Medicine

## 2023-03-11 ENCOUNTER — Encounter: Payer: Self-pay | Admitting: Cardiology

## 2023-03-22 ENCOUNTER — Ambulatory Visit: Payer: Medicare HMO | Attending: Cardiology | Admitting: Cardiology

## 2023-03-22 ENCOUNTER — Encounter: Payer: Self-pay | Admitting: Cardiology

## 2023-03-22 VITALS — BP 132/84 | HR 54 | Resp 16 | Ht 69.0 in | Wt 203.4 lb

## 2023-03-22 DIAGNOSIS — I2510 Atherosclerotic heart disease of native coronary artery without angina pectoris: Secondary | ICD-10-CM

## 2023-03-22 DIAGNOSIS — Z79899 Other long term (current) drug therapy: Secondary | ICD-10-CM | POA: Diagnosis not present

## 2023-03-22 NOTE — Patient Instructions (Signed)
Medication Instructions:  Your physician recommends that you continue on your current medications as directed. Please refer to the Current Medication list given to you today.  *If you need a refill on your cardiac medications before your next appointment, please call your pharmacy*   Lab Work: Please complete a FASTING lipid panel and a lipoprotein at any [facility] in 3 months.   If you have labs (blood work) drawn today and your tests are completely normal, you will receive your results only by: MyChart Message (if you have MyChart) OR A paper copy in the mail If you have any lab test that is abnormal or we need to change your treatment, we will call you to review the results.   Testing/Procedures: Your physician has requested that you have en exercise stress myoview. For further information please visit [email or web address] Please follow instruction sheet, as given.   Your physician has requested that you have an echocardiogram. Echocardiography is a painless test that uses sound waves to create images of your heart. It provides your doctor with information about the size and shape of your heart and how well your heart's chambers and valves are working. This procedure takes approximately one hour. There are no restrictions for this procedure. Please do NOT wear cologne, perfume, aftershave, or lotions (deodorant is allowed). Please arrive 15 minutes prior to your appointment time.  Please note: We ask at that you not bring children with you during ultrasound (echo/ vascular) testing. Due to room size and safety concerns, children are not allowed in the ultrasound rooms during exams. Our front office staff cannot provide observation of children in our lobby area while testing is being conducted. An adult accompanying a patient to their appointment will only be allowed in the ultrasound room at the discretion of the ultrasound technician under special circumstances. We apologize for any  inconvenience.    Follow-Up: At [facility], you and your health needs are our priority.  As part of our continuing mission to provide you with exceptional heart care, we have created designated Provider Care Teams.  These Care Teams include your primary Cardiologist (physician) and Advanced Practice Providers (APPs -  Physician Assistants and Nurse Practitioners) who all work together to provide you with the care you need, when you need it.  We recommend signing up for the patient portal called "MyChart".  Sign up information is provided on this After Visit Summary.  MyChart is used to connect with patients for Virtual Visits (Telemedicine).  Patients are able to view lab/test results, encounter notes, upcoming appointments, etc.  Non-urgent messages can be sent to your provider as well.   To learn more about what you can do with MyChart, go to [email or web address].    Your next appointment:   5 month(s)  Provider:   Dr. Judie Petit. Rosemary Holms, MD

## 2023-03-22 NOTE — Progress Notes (Addendum)
 Cardiology Office Note:  .   Date:  03/22/2023  ID:  [name], DOB 02/17/1956, MRN [id number] PCP: Georgina Quint, MD  [facility] Providers Cardiologist:  Truett Mainland, MD PCP: Georgina Quint, MD  Chief Complaint  Patient presents with   Coronary Artery Disease   Discussed the use of AI scribe software for clinical note transcription with the patient, who gave verbal consent to proceed.     History of Present Illness: .    GIOVANNI BIBY is a 67 y.o. male with CAD, hyperlipidemia  Patient had a myocardial infarction in 2009, where he experienced symptoms of chest and back pain, severe diaphoresis and shaking.  He underwent 4.5 and 4.0 mm 2 stents placed in RCA.  Patient currently runs 3 miles most days of the week without any complaints of chest pain or shortness of breath.  Reviewed recent lipid panel results with the patient.  Patient eats red meat 4-5 times a week.  A separate note, patient is going to undergo hip labrum surgery in the next few weeks time.  The surgery is to prevent further damage to the hip joint and avoid hip replacement surgery.  He is still able to run, but it does cause pain and discomfort.  After his hip labrum surgery, he will not be able to return for some time.  Patient is a non-smoker, nondiabetic, does have family history of hyperlipidemia.  Vitals:   03/22/23 1441  BP: 132/84  Pulse: (!) 54  Resp: 16  SpO2: 94%     ROS:  Review of Systems  Cardiovascular:  Negative for chest pain, dyspnea on exertion, leg swelling, palpitations and syncope.     Studies Reviewed: Marland Kitchen        EKG 03/22/2023: Sinus rhythm 54 bpm Minimal voltage criteria for LVH, may be normal variant ( Sokolow-Lyon ) Junctional ST depression, probably normal When compared with ECG of 31-Oct-2019 11:29, No significant change since     Independently interpreted 12/2022: Chol 164, TG 83, HDL 61, LDL 86 HbA1C 5.6% Hb 14.5 Cr  1.1    Physical Exam:   Physical Exam Vitals and nursing note reviewed.  Constitutional:      General: He is not in acute distress. Neck:     Vascular: No JVD.  Cardiovascular:     Rate and Rhythm: Normal rate and regular rhythm.     Heart sounds: Normal heart sounds. No murmur heard. Pulmonary:     Effort: Pulmonary effort is normal.     Breath sounds: Normal breath sounds. No wheezing or rales.  Musculoskeletal:     Right lower leg: No edema.     Left lower leg: No edema.      VISIT DIAGNOSES:   ICD-10-CM   1. Coronary artery disease involving native coronary artery of native heart without angina pectoris  I25.10 EKG 12-Lead    ECHOCARDIOGRAM COMPLETE    MYOCARDIAL PERFUSION IMAGING    Lipid panel    Lipoprotein A (LPA)    Lipoprotein A (LPA)    Lipid panel    2. Medication management  Z79.899 Lipid panel    Lipoprotein A (LPA)    Lipoprotein A (LPA)    Lipid panel       ASSESSMENT AND PLAN: .    JAVONTAE MARLETTE is a 67 y.o. male with CAD, hyperlipidemia  CAD: Prior MI in 2009, treated with 2 stents to RCA 4.0, and 4.5 mm in diameter. Currently no angina symptoms.  Given excellent functional capacity, perioperative cardiac risk for hip surgery is low. 50 to proceed with surgery. Separately, I would recommend echocardiogram and exercise nuclear stress test given no recent cardiac workup in 15 years..  That said, I do not think his surgery needs to be delayed for these tests. Continue aspirin 81 mg daily. Currently on 80 mg daily, LDL 86, HDL is 61. Discussed diet modifications, with reducing red meat intake. Repeat lipid panel and check lipoprotein a in 3 months.  If LDL remains >70, he will likely need additional Zetia or PCSK9 elevators.  Mixed hyperlipidemia: As above.   Informed Consent   Shared Decision Making/Informed Consent The risks [chest pain, shortness of breath, cardiac arrhythmias, dizziness, blood pressure fluctuations, myocardial  infarction, stroke/transient ischemic attack, nausea, vomiting, allergic reaction, radiation exposure, metallic taste sensation and life-threatening complications (estimated to be 1 in 10,000)], benefits (risk stratification, diagnosing coronary artery disease, treatment guidance) and alternatives of a nuclear stress test were discussed in detail with Mr. Thorstenson and he agrees to proceed.       F/u in 6 months  Signed, Elder Negus, MD

## 2023-03-26 ENCOUNTER — Encounter (HOSPITAL_BASED_OUTPATIENT_CLINIC_OR_DEPARTMENT_OTHER): Payer: Self-pay | Admitting: Orthopaedic Surgery

## 2023-04-01 ENCOUNTER — Encounter (HOSPITAL_BASED_OUTPATIENT_CLINIC_OR_DEPARTMENT_OTHER): Payer: Self-pay | Admitting: Orthopaedic Surgery

## 2023-04-04 ENCOUNTER — Telehealth: Payer: Self-pay | Admitting: *Deleted

## 2023-04-04 NOTE — Telephone Encounter (Signed)
-----   Message from Donnie Coffin sent at 04/04/2023  9:33 AM EST ----- Regarding: FW: Echo/Stress appointment Hey there!!! I got patient scheduled for echo for 04/05/23... Just fyi!!!! ----- Message ----- From: Loa Socks, LPN Sent: 16/01/9603   5:52 PM EST To: Minette Brine Subject: FW: Echo/Stress appointment                     ----- Message ----- From: Luellen Pucker, RN Sent: 03/22/2023   5:00 PM EST To: Loa Socks, LPN Subject: Echo/Stress appointment                        Rhett Bannister, This patient saw Dr. Rosemary Holms on 03/22/23 and is having shoulder surgery with Dr. Steward Drone in the near future. He was ordered to have an Echo and an exercise Nuclear stress test but for some reason that was scheduled for April. He was also supposed to have a f/u appt in April 2025 as well, but that was not scheduled by check-out. I don't know if that's because the slots in April say "Hold for Provider review" or if the patient told check out something crazy. I put in a 3 month recall just so he would have something to remind him to come back in.  I think Dr. Rosemary Holms was expecting that this guy would at least have his Echo done before his surgery and there are plenty of openings even in February. I have a feeling this guy went to check out and just decided he was going to do his testing after his surgery.  I don't think Dr. Rosemary Holms needed everything done before April but I think he was expecting that something would be done.  So unfortunately I would say you probably want to follow up on this guy and make sure he didn't get confused about what he was supposed to do.  Alcario Drought, RN

## 2023-04-05 ENCOUNTER — Encounter (HOSPITAL_BASED_OUTPATIENT_CLINIC_OR_DEPARTMENT_OTHER): Payer: Self-pay | Admitting: Physical Therapy

## 2023-04-05 ENCOUNTER — Encounter (HOSPITAL_BASED_OUTPATIENT_CLINIC_OR_DEPARTMENT_OTHER): Payer: Self-pay

## 2023-04-05 ENCOUNTER — Encounter: Payer: Self-pay | Admitting: Cardiology

## 2023-04-05 ENCOUNTER — Ambulatory Visit (HOSPITAL_COMMUNITY): Payer: Medicare HMO | Attending: Cardiology

## 2023-04-05 ENCOUNTER — Ambulatory Visit (HOSPITAL_BASED_OUTPATIENT_CLINIC_OR_DEPARTMENT_OTHER): Payer: Medicare HMO | Attending: Orthopaedic Surgery | Admitting: Physical Therapy

## 2023-04-05 DIAGNOSIS — M25652 Stiffness of left hip, not elsewhere classified: Secondary | ICD-10-CM | POA: Insufficient documentation

## 2023-04-05 DIAGNOSIS — M25552 Pain in left hip: Secondary | ICD-10-CM | POA: Insufficient documentation

## 2023-04-05 DIAGNOSIS — I2510 Atherosclerotic heart disease of native coronary artery without angina pectoris: Secondary | ICD-10-CM | POA: Diagnosis not present

## 2023-04-05 LAB — ECHOCARDIOGRAM COMPLETE
MV M vel: 5.17 m/s
MV Peak grad: 106.9 mm[Hg]
Radius: 0.3 cm
S' Lateral: 2.86 cm

## 2023-04-05 NOTE — Progress Notes (Signed)
Normal LV function. Minor valve abnormality not clinically significant

## 2023-04-05 NOTE — Telephone Encounter (Signed)
Yates Decamp, MD 04/05/2023  1:22 PM EST     Normal LV function. Minor valve abnormality not clinically significant    The patient has been notified of the result and verbalized understanding.  All questions (if any) were answered. Loa Socks, LPN 16/01/9603 5:40 PM

## 2023-04-05 NOTE — Therapy (Signed)
OUTPATIENT PHYSICAL THERAPY LOWER EXTREMITY EVALUATION   Patient Name: Johnny ESTEVES MRN: [id number] DOB:1955-05-18, 67 y.o., male Today's Date: 04/05/2023  END OF SESSION:   Past Medical History:  Diagnosis Date   Hemorrhoids    Hyperlipidemia    Myocardial infarction (HCC) 2009   Sleep apnea    no cpap used did not tolerate, moderate osa   Past Surgical History:  Procedure Laterality Date   CARDIAC CATHETERIZATION  2009   stents to mid rca x 2   EVALUATION UNDER ANESTHESIA WITH HEMORRHOIDECTOMY N/A 04/21/2020   Procedure: HEMORRHOIDECTOMY X3 WITH LIGATION AND HEMORRHOIDOPEXY ANORECTAL EXAMINATION UNDER ANESTHESIA;  Surgeon: Karie Soda, MD;  Location: [facility];  Service: General;  Laterality: N/A;   FRACTURE SURGERY  1974   right wrist   SPINE SURGERY  2008 and 2009   cervical C 3 C4 C5 fusion   Patient Active Problem List   Diagnosis Date Noted   History of coronary artery disease 12/15/2019   External hemorrhoids 12/15/2019   Dyslipidemia 12/15/2019    PCP: Altamese Dilling MD  REFERRING PROVIDER: Dr Huel Cote   REFERRING DIAG: Left Hip Pain   THERAPY DIAG:  No diagnosis found.  Rationale for Evaluation and Treatment: Rehabilitation  ONSET DATE: Over the course of this year  SUBJECTIVE:   SUBJECTIVE STATEMENT: Patient returns after performing HEP for 14 weeks. He has advanced his own HEP as advised on evaluation. He has been working on a self guided program given to him by therapist. His pain has increased with activity. He continues to have spasming and pain when running and performing exercises.  PERTINENT HISTORY: MI 2009   PAIN:  Are you having pain? Yes: NPRS scale: 4-5/10 Pain location: aching in the front of the hip  Pain description: aching  Aggravating factors: sitting and crossing legs  Relieving factors: Stretch in the morning   PRECAUTIONS: None  RED FLAGS: None   WEIGHT BEARING RESTRICTIONS: No  FALLS:   Has patient fallen in last 6 months? No  LIVING ENVIRONMENT: No steps in his house OCCUPATION:  Works for a [employer]. Does a lot of walking and also sits at a computer   Hobbies: running  Swimming    PLOF: Independent  PATIENT GOALS:  To get a plan for strengthening and stretching   NEXT MD VISIT:    OBJECTIVE:   DIAGNOSTIC FINDINGS:  MPRESSION: 1. Severe left superior labral degeneration with a 4 mm superior paralabral cyst likely reflecting a small occult tear. 2. Partial-thickness cartilage loss of the femoral head and acetabulum bilateral consistent with mild osteoarthritis. 3. Mild tendinosis of the left hamstring origin with a tiny partial thickness tear.  PATIENT SURVEYS:  Potential 1x visit  COGNITION: Overall cognitive status: Within functional limits for tasks assessed     SENSATION:      PALPATION: Tender to palpaation in the left TFL   LOWER EXTREMITY ROM:  Passive ROM Right eval Left eval Left  12/20  Hip flexion  Painful passed 90 degrees  Painful passed 90 degrees   Hip extension     Hip abduction     Hip adduction     Hip internal rotation  Painful passed 5 degrees  Painful passed neutral  Hip external rotation  Painful passed 60 degrees  Painful passed 65 degrees   Knee flexion     Knee extension     Ankle dorsiflexion     Ankle plantarflexion     Ankle inversion  Ankle eversion      (Blank rows = not tested)  LOWER EXTREMITY MMT:  MMT Right eval Left eval Right  left  Hip flexion 41.1 43.7 42.0 33.0  Hip extension      Hip abduction 55.9 51.0 39.9 39.1  Hip adduction      Hip internal rotation      Hip external rotation      Knee flexion      Knee extension   48.0 45.0  Ankle dorsiflexion      Ankle plantarflexion      Ankle inversion      Ankle eversion       (Blank rows = not tested)    FUNCTIONAL TESTS:  SLS limited bilateral   Squat 3x minimal pain   GAIT: No gait deviations  TODAY'S  TREATMENT:                                                                                                                              DATE:  12/20 Assessed patients balance   Bridge with band 2x20 green   Single leg bridge 2x12  Hip abduction 2x20  Side stepping with band x20   Cone drill x20     Eval: Access Code: [email or web address] URL: [email or web address] Date: 12/18/2022 Prepared by: Lorayne Bender  Exercises - Modified Maisie Fus Stretch  - 1 x daily - 7 x weekly - 3 sets - 3 reps - 20 hold - Supine Bridge  - 1 x daily - 7 x weekly - 3 sets - 10 reps - Single Leg Bridge  - 1 x daily - 7 x weekly - 3 sets - 10 reps - Single Leg Stance with Diagonal Forward Reach  - 1 x daily - 7 x weekly - 3 sets - 10 reps - Side Stepping/ Forward stepping with band   - 1 x daily - 7 x weekly - 3 sets - 10 reps    PATIENT EDUCATION:  Education details: HEP, symptom management, progression of exercises, plan going forward. Person educated: Patient Education method: Explanation, Demonstration, Tactile cues, Verbal cues, and Handouts Education comprehension: verbalized understanding, returned demonstration, verbal cues required, tactile cues required, and needs further education  HOME EXERCISE PROGRAM: Access Code: [email or web address] URL: [email or web address] Date: 12/18/2022 Prepared by: Lorayne Bender  Exercises - Modified Maisie Fus Stretch  - 1 x daily - 7 x weekly - 3 sets - 3 reps - 20 hold - Supine Bridge  - 1 x daily - 7 x weekly - 3 sets - 10 reps - Single Leg Bridge  - 1 x daily - 7 x weekly - 3 sets - 10 reps - Single Leg Stance with Diagonal Forward Reach  - 1 x daily - 7 x weekly - 3 sets - 10 reps - Side Stepping/ Forward stepping with band   - 1 x daily - 7 x weekly - 3 sets - 10 reps  ASSESSMENT:  CLINICAL IMPRESSION: Patient has  been complaint with HEP and has progressed difficulty of exercises as advised. He has been working on his exercises constantly over the  past 14 weeks  He has had no significant improvement in ROM or strength. He has had a decline in gluteal strength bilateral. His hip flexor strength is about the same. He continues to have single leg instability despite working on single leg balance exercise at home consistently over a six week period. At this time we will send him back to MD for further follow up and evaluation. He was advised to continue with current exercises but do not perform exercises that cause significant pain.   Eval: Patient is a 67 year old male with a progressive onset of left hip pain and pinching over the past year.  He reports difficulty putting on his shoes and with sitting.  He reports his hips get sore when he sits for too long.  He presents with increased pain with hip flexion past 90, internal rotation, and external rotation.  He has tenderness to palpation in his TFL.  He was given a base hip stability program to work on at home.  He is unsure if he is going to come back for a follow-up.  He will work on his exercises and call back as needed.  He also presents with decreased single-leg stability.  The avid runner.  We talked about improving that single-leg stability to improve his lateral movement when running.  This should help reduce his pain.  He plans to have his labrum repaired sometime in 2025.  He may also benefit from an aquatic visit.  Therapy will follow-up per patient.  OBJECTIVE IMPAIRMENTS: decreased activity tolerance, decreased ROM, increased muscle spasms, and pain.   ACTIVITY LIMITATIONS: sitting and dressing  PARTICIPATION LIMITATIONS: driving, community activity, and occupation  PERSONAL FACTORS: None  REHAB POTENTIAL: Good  CLINICAL DECISION MAKING: Stable/uncomplicated  EVALUATION COMPLEXITY: Low   GOALS: Goals reviewed with patient? Yes  STG equals LTG   LONG TERM GOALS: Target date: 02/12/2023    Patient will sit for greater than an hour at work without increased left hip  pain Baseline:  Goal status: continues to have pain sitting 12/20  2.  Patient will put on his shoes without hip pain Baseline:  Goal status: continues to have difficulty 12/20   3.  Patient will demonstrate 120 degrees of passive hip flexion in order to perform deep bending activities at home Baseline:  Goal status: continuesto have difficulty 12/20   PLAN:  PT FREQUENCY: 2x/week  PT DURATION: 8 weeks  PLANNED INTERVENTIONS: Therapeutic exercises, Therapeutic activity, Neuromuscular re-education, Balance training, Gait training, Patient/Family education, Self Care, Joint mobilization, Stair training, DME instructions, Aquatic Therapy, Dry Needling, Electrical stimulation, Cryotherapy, Moist heat, Taping, Manual therapy, and Re-evaluation.   PLAN FOR NEXT SESSION: If patient returns review home exercises.  Consider instability on Airex.  Consider monster walk.  Continue with manual therapy to anterior hip.  Consider inferior glide to improve flexion.  Consider progression into gym equipment if patient plans on going to the gym.   Dessie Coma, PT 04/05/2023, 4:01 PM

## 2023-04-06 ENCOUNTER — Encounter (HOSPITAL_BASED_OUTPATIENT_CLINIC_OR_DEPARTMENT_OTHER): Payer: Self-pay | Admitting: Physical Therapy

## 2023-04-15 ENCOUNTER — Ambulatory Visit (HOSPITAL_BASED_OUTPATIENT_CLINIC_OR_DEPARTMENT_OTHER): Payer: Medicare HMO | Admitting: Physical Therapy

## 2023-04-15 ENCOUNTER — Encounter (HOSPITAL_BASED_OUTPATIENT_CLINIC_OR_DEPARTMENT_OTHER): Payer: Self-pay | Admitting: Physical Therapy

## 2023-04-15 DIAGNOSIS — M25652 Stiffness of left hip, not elsewhere classified: Secondary | ICD-10-CM | POA: Diagnosis not present

## 2023-04-15 DIAGNOSIS — M25552 Pain in left hip: Secondary | ICD-10-CM

## 2023-04-15 NOTE — Therapy (Signed)
OUTPATIENT PHYSICAL THERAPY LOWER EXTREMITY EVALUATION   Patient Name: Johnny Boyer MRN: [id number] DOB:09-Dec-1955, 67 y.o., male Today's Date: 04/15/2023  END OF SESSION:  PT End of Session - 04/15/23 1402     Visit Number 2    Number of Visits 8    Date for PT Re-Evaluation 05/31/23    PT Start Time 1400    PT Stop Time 1440    PT Time Calculation (min) 40 min    Activity Tolerance Patient tolerated treatment well    Behavior During Therapy [facility] for tasks assessed/performed             Past Medical History:  Diagnosis Date   Hemorrhoids    Hyperlipidemia    Myocardial infarction (HCC) 2009   Sleep apnea    no cpap used did not tolerate, moderate osa   Past Surgical History:  Procedure Laterality Date   CARDIAC CATHETERIZATION  2009   stents to mid rca x 2   EVALUATION UNDER ANESTHESIA WITH HEMORRHOIDECTOMY N/A 04/21/2020   Procedure: HEMORRHOIDECTOMY X3 WITH LIGATION AND HEMORRHOIDOPEXY ANORECTAL EXAMINATION UNDER ANESTHESIA;  Surgeon: Karie Soda, MD;  Location: [facility];  Service: General;  Laterality: N/A;   FRACTURE SURGERY  1974   right wrist   SPINE SURGERY  2008 and 2009   cervical C 3 C4 C5 fusion   Patient Active Problem List   Diagnosis Date Noted   History of coronary artery disease 12/15/2019   External hemorrhoids 12/15/2019   Dyslipidemia 12/15/2019    PCP: Altamese Dilling MD  REFERRING PROVIDER: Dr Huel Cote   REFERRING DIAG: Left Hip Pain   THERAPY DIAG:  Pain in left hip  Stiffness of left hip, not elsewhere classified  Rationale for Evaluation and Treatment: Rehabilitation  ONSET DATE: Over the course of this year  SUBJECTIVE:   SUBJECTIVE STATEMENT:  The patient is required by insurance to continue PT. He reports his hip is the same. He continues to have soreness in the left hip but somewhat in the right too.   MI 2009   PAIN:  Are you having pain? Yes: NPRS scale: 4-5/10 Pain location: aching  in the front of the hip  Pain description: aching  Aggravating factors: sitting and crossing legs  Relieving factors: Stretch in the morning   PRECAUTIONS: None  RED FLAGS: None   WEIGHT BEARING RESTRICTIONS: No  FALLS:  Has patient fallen in last 6 months? No  LIVING ENVIRONMENT: No steps in his house OCCUPATION:  Works for a [employer]. Does a lot of walking and also sits at a computer   Hobbies: running  Swimming    PLOF: Independent  PATIENT GOALS:  To get a plan for strengthening and stretching   NEXT MD VISIT:    OBJECTIVE:   DIAGNOSTIC FINDINGS:  MPRESSION: 1. Severe left superior labral degeneration with a 4 mm superior paralabral cyst likely reflecting a small occult tear. 2. Partial-thickness cartilage loss of the femoral head and acetabulum bilateral consistent with mild osteoarthritis. 3. Mild tendinosis of the left hamstring origin with a tiny partial thickness tear.  PATIENT SURVEYS:  Potential 1x visit  COGNITION: Overall cognitive status: Within functional limits for tasks assessed     SENSATION:      PALPATION: Tender to palpaation in the left TFL   LOWER EXTREMITY ROM:  Passive ROM Right eval Left eval Left  12/20  Hip flexion  Painful passed 90 degrees  Painful passed 90 degrees  Hip extension     Hip abduction     Hip adduction     Hip internal rotation  Painful passed 5 degrees  Painful passed neutral  Hip external rotation  Painful passed 60 degrees  Painful passed 65 degrees   Knee flexion     Knee extension     Ankle dorsiflexion     Ankle plantarflexion     Ankle inversion     Ankle eversion      (Blank rows = not tested)  LOWER EXTREMITY MMT:  MMT Right eval Left eval Right  left  Hip flexion 41.1 43.7 42.0 33.0  Hip extension      Hip abduction 55.9 51.0 39.9 39.1  Hip adduction      Hip internal rotation      Hip external rotation      Knee flexion      Knee extension   48.0 45.0  Ankle  dorsiflexion      Ankle plantarflexion      Ankle inversion      Ankle eversion       (Blank rows = not tested)    FUNCTIONAL TESTS:  SLS limited bilateral   Squat 3x minimal pain   GAIT: No gait deviations  TODAY'S TREATMENT:                                                                                                                              DATE:  12/31  Manual: interior and posterior glides; anterior trigger point release; Side lying anteriro hip stretch   Bridge with band blue 2x20   Cable walk 20 lbs x10   Eccentric step down 2inch 2x15 each side   Step onto air-ex 2x20 sec hold.      12/20 Assessed patients balance   Bridge with band 2x20 green   Single leg bridge 2x12  Hip abduction 2x20  Side stepping with band x20   Cone drill x20     Eval: Access Code: [email or web address] URL: [email or web address] Date: 12/18/2022 Prepared by: Lorayne Bender  Exercises - Modified Maisie Fus Stretch  - 1 x daily - 7 x weekly - 3 sets - 3 reps - 20 hold - Supine Bridge  - 1 x daily - 7 x weekly - 3 sets - 10 reps - Single Leg Bridge  - 1 x daily - 7 x weekly - 3 sets - 10 reps - Single Leg Stance with Diagonal Forward Reach  - 1 x daily - 7 x weekly - 3 sets - 10 reps - Side Stepping/ Forward stepping with band   - 1 x daily - 7 x weekly - 3 sets - 10 reps    PATIENT EDUCATION:  Education details: HEP, symptom management, progression of exercises, plan going forward. Person educated: Patient Education method: Explanation, Demonstration, Tactile cues, Verbal cues, and Handouts Education comprehension: verbalized understanding, returned demonstration, verbal cues required, tactile cues required, and needs further education  HOME EXERCISE PROGRAM: Access Code: [email or web address] URL: [email or web address] Date: 12/18/2022 Prepared by: Lorayne Bender  Exercises - Modified Maisie Fus Stretch  - 1 x daily - 7 x weekly - 3 sets - 3 reps - 20 hold - Supine  Bridge  - 1 x daily - 7 x weekly - 3 sets - 10 reps - Single Leg Bridge  - 1 x daily - 7 x weekly - 3 sets - 10 reps - Single Leg Stance with Diagonal Forward Reach  - 1 x daily - 7 x weekly - 3 sets - 10 reps - Side Stepping/ Forward stepping with band   - 1 x daily - 7 x weekly - 3 sets - 10 reps  ASSESSMENT:  CLINICAL IMPRESSION:  Therapy worked on manual therapy today to reduce anterior stress on the labrum. We also worked on eccentric loading of the hip and stability exercises. He had no increase in pain. His pain was about the same following. We reviewed self soft tissue to perform. We will continue to progress as tolerated.   Eval: Patient is a 67 year old male with a progressive onset of left hip pain and pinching over the past year.  He reports difficulty putting on his shoes and with sitting.  He reports his hips get sore when he sits for too long.  He presents with increased pain with hip flexion past 90, internal rotation, and external rotation.  He has tenderness to palpation in his TFL.  He was given a base hip stability program to work on at home.  He is unsure if he is going to come back for a follow-up.  He will work on his exercises and call back as needed.  He also presents with decreased single-leg stability.  The avid runner.  We talked about improving that single-leg stability to improve his lateral movement when running.  This should help reduce his pain.  He plans to have his labrum repaired sometime in 2025.  He may also benefit from an aquatic visit.  Therapy will follow-up per patient.  OBJECTIVE IMPAIRMENTS: decreased activity tolerance, decreased ROM, increased muscle spasms, and pain.   ACTIVITY LIMITATIONS: sitting and dressing  PARTICIPATION LIMITATIONS: driving, community activity, and occupation  PERSONAL FACTORS: None  REHAB POTENTIAL: Good  CLINICAL DECISION MAKING: Stable/uncomplicated  EVALUATION COMPLEXITY: Low   GOALS: Goals reviewed with patient?  Yes  STG equals LTG   LONG TERM GOALS: Target date: 02/12/2023    Patient will sit for greater than an hour at work without increased left hip pain Baseline:  Goal status: continues to have pain sitting 12/20  2.  Patient will put on his shoes without hip pain Baseline:  Goal status: continues to have difficulty 12/20   3.  Patient will demonstrate 120 degrees of passive hip flexion in order to perform deep bending activities at home Baseline:  Goal status: continuesto have difficulty 12/20   PLAN:  PT FREQUENCY: 2x/week  PT DURATION: 8 weeks  PLANNED INTERVENTIONS: Therapeutic exercises, Therapeutic activity, Neuromuscular re-education, Balance training, Gait training, Patient/Family education, Self Care, Joint mobilization, Stair training, DME instructions, Aquatic Therapy, Dry Needling, Electrical stimulation, Cryotherapy, Moist heat, Taping, Manual therapy, and Re-evaluation.   PLAN FOR NEXT SESSION: If patient returns review home exercises.  Consider instability on Airex.  Consider monster walk.  Continue with manual therapy to anterior hip.  Consider inferior glide to improve flexion.  Consider progression into gym equipment if patient plans on going to the gym.   Dessie Coma,  PT 04/15/2023, 2:25 PM

## 2023-04-16 ENCOUNTER — Encounter (HOSPITAL_BASED_OUTPATIENT_CLINIC_OR_DEPARTMENT_OTHER): Payer: Self-pay | Admitting: Physical Therapy

## 2023-04-22 ENCOUNTER — Encounter (HOSPITAL_BASED_OUTPATIENT_CLINIC_OR_DEPARTMENT_OTHER): Payer: Self-pay | Admitting: Physical Therapy

## 2023-04-22 DIAGNOSIS — B399 Histoplasmosis, unspecified: Secondary | ICD-10-CM | POA: Diagnosis not present

## 2023-04-22 DIAGNOSIS — H5213 Myopia, bilateral: Secondary | ICD-10-CM | POA: Diagnosis not present

## 2023-04-22 DIAGNOSIS — H2513 Age-related nuclear cataract, bilateral: Secondary | ICD-10-CM | POA: Diagnosis not present

## 2023-04-24 ENCOUNTER — Ambulatory Visit (HOSPITAL_BASED_OUTPATIENT_CLINIC_OR_DEPARTMENT_OTHER): Payer: Medicare HMO | Attending: Orthopaedic Surgery | Admitting: Physical Therapy

## 2023-04-24 ENCOUNTER — Encounter (HOSPITAL_BASED_OUTPATIENT_CLINIC_OR_DEPARTMENT_OTHER): Payer: Self-pay | Admitting: Physical Therapy

## 2023-04-24 DIAGNOSIS — M25652 Stiffness of left hip, not elsewhere classified: Secondary | ICD-10-CM | POA: Diagnosis not present

## 2023-04-24 DIAGNOSIS — M25552 Pain in left hip: Secondary | ICD-10-CM | POA: Insufficient documentation

## 2023-04-24 DIAGNOSIS — M25852 Other specified joint disorders, left hip: Secondary | ICD-10-CM | POA: Insufficient documentation

## 2023-04-24 NOTE — Therapy (Signed)
 OUTPATIENT PHYSICAL THERAPY LOWER EXTREMITY TREATMENT   Patient Name: Johnny Boyer MRN: [id number] DOB:03-Jul-1955, 68 y.o., male Today's Date: 04/24/2023  END OF SESSION:  PT End of Session - 04/24/23 0948     Visit Number 3    Number of Visits 8    Date for PT Re-Evaluation 05/31/23    PT Start Time 0946    PT Stop Time 1025    PT Time Calculation (min) 39 min             Past Medical History:  Diagnosis Date   Hemorrhoids    Hyperlipidemia    Myocardial infarction (HCC) 2009   Sleep apnea    no cpap used did not tolerate, moderate osa   Past Surgical History:  Procedure Laterality Date   CARDIAC CATHETERIZATION  2009   stents to mid rca x 2   EVALUATION UNDER ANESTHESIA WITH HEMORRHOIDECTOMY N/A 04/21/2020   Procedure: HEMORRHOIDECTOMY X3 WITH LIGATION AND HEMORRHOIDOPEXY ANORECTAL EXAMINATION UNDER ANESTHESIA;  Surgeon: Sheldon Standing, MD;  Location: [facility];  Service: General;  Laterality: N/A;   FRACTURE SURGERY  1974   right wrist   SPINE SURGERY  2008 and 2009   cervical C 3 C4 C5 fusion   Patient Active Problem List   Diagnosis Date Noted   History of coronary artery disease 12/15/2019   External hemorrhoids 12/15/2019   Dyslipidemia 12/15/2019    PCP: Emil Lev MD  REFERRING PROVIDER: Dr Standing Parker   REFERRING DIAG: Left Hip Pain   THERAPY DIAG:  Pain in left hip  Stiffness of left hip, not elsewhere classified  Rationale for Evaluation and Treatment: Rehabilitation  ONSET DATE: Over the course of this year  SUBJECTIVE:   SUBJECTIVE STATEMENT: Pt reports some relief from manual therapy last session; hip felt good during run.  Pain returned later.   MI 2009   PAIN:  Are you having pain? Yes: NPRS scale: 3/10 Pain location: bilat hips (lateral and anterior) Pain description: aching  Aggravating factors: sitting and crossing legs  Relieving factors: Stretch in the morning   PRECAUTIONS: None  RED  FLAGS: None   WEIGHT BEARING RESTRICTIONS: No  FALLS:  Has patient fallen in last 6 months? No  LIVING ENVIRONMENT: No steps in his house OCCUPATION:  Works for a [employer]. Does a lot of walking and also sits at a computer.  Plans to retire in May.   Hobbies: running  Swimming    PLOF: Independent  PATIENT GOALS:  To get a plan for strengthening and stretching   NEXT MD VISIT:    OBJECTIVE:   DIAGNOSTIC FINDINGS:  MPRESSION: 1. Severe left superior labral degeneration with a 4 mm superior paralabral cyst likely reflecting a small occult tear. 2. Partial-thickness cartilage loss of the femoral head and acetabulum bilateral consistent with mild osteoarthritis. 3. Mild tendinosis of the left hamstring origin with a tiny partial thickness tear.  PATIENT SURVEYS:  Potential 1x visit  COGNITION: Overall cognitive status: Within functional limits for tasks assessed     SENSATION:      PALPATION: Tender to palpaation in the left TFL   LOWER EXTREMITY ROM:  Passive ROM Right eval Left eval Left  12/20  Hip flexion  Painful passed 90 degrees  Painful passed 90 degrees   Hip extension     Hip abduction     Hip adduction     Hip internal rotation  Painful passed 5 degrees  Painful passed neutral  Hip external rotation  Painful passed 60 degrees  Painful passed 65 degrees   Knee flexion     Knee extension     Ankle dorsiflexion     Ankle plantarflexion     Ankle inversion     Ankle eversion      (Blank rows = not tested)  LOWER EXTREMITY MMT:  MMT Right eval Left eval Right  left  Hip flexion 41.1 43.7 42.0 33.0  Hip extension      Hip abduction 55.9 51.0 39.9 39.1  Hip adduction      Hip internal rotation      Hip external rotation      Knee flexion      Knee extension   48.0 45.0  Ankle dorsiflexion      Ankle plantarflexion      Ankle inversion      Ankle eversion       (Blank rows = not tested)    FUNCTIONAL TESTS:  SLS  limited bilateral   Squat 3x minimal pain   GAIT: No gait deviations  TODAY'S TREATMENT:                                                                                                                              DATE:  04/24/23 - Xband walk with blue band, 77ft R/L x 2 - single leg squat with opp leg sliding out side, back, curtsy x 10 each LE - single leg heel taps from 2 step with light UE on counter x 10 each LE (alternating) - cues for hip hinge to reduce knee discomfort - seated hip flexor stretch at edge of table, with leg in ext x 15s x 2 each LE - seated hamstring stretch x 15s x 2 reps - bridge with 10 sec hold, x 5 with small pillow under head - SLS each LE, able to hold up to 5 sec  Self care: self massage with ball against wall to hip musculature, back musculature  12/31 Manual: interior and posterior glides; anterior trigger point release; Side lying anteriro hip stretch   Bridge with band blue 2x20   Cable walk 20 lbs x10   Eccentric step down 2inch 2x15 each side   Step onto air-ex 2x20 sec hold.    12/20 Assessed patients balance   Bridge with band 2x20 green   Single leg bridge 2x12  Hip abduction 2x20  Side stepping with band x20   Cone drill x20    PATIENT EDUCATION:  Education details: HEP,  progression of exercises, Person educated: Patient Education method: Explanation, Demonstration, Tactile cues, Verbal cues, and Handouts Education comprehension: verbalized understanding, returned demonstration, verbal cues required, tactile cues required, and needs further education  HOME EXERCISE PROGRAM: Access Code: [email or web address] URL: [email or web address] Date: 04/24/2023 Prepared by: [facility] - [facility] - [address]  Exercises - Seated Hip Flexor Stretch  - 2 x daily - 7 x weekly - 2 reps - 15  hold (alternative option) - Modified Thomas Stretch  - 1 x daily - 7 x weekly - 2 reps - 20 hold - Supine Bridge  - 1 x daily - 7 x  weekly - 3 sets - 10 reps - Single Leg Bridge  - 1 x daily - 7 x weekly - 3 sets - 10 reps - Single Leg Stance with Diagonal Forward Reach  - 1 x daily - 7 x weekly - 3 sets - 10 reps - Side Stepping/ Forward stepping with band   - 1 x daily - 7 x weekly - 3 sets - 10 reps - Forward Step Down Touch with Heel  - 1 x daily - 7 x weekly - 3 sets - 10 reps - Single Leg Stance  - 1 x daily - 7 x weekly - 2-3 reps - 10-30 seconds  hold  ASSESSMENT:  CLINICAL IMPRESSION: Pt had good tolerance for exercises performed today; no increase in pain just fatige.  Modified HEP with added balance exercise and hip flexor stretch alternative. Will continue to progress as tolerated. Goals are ongoing.   Eval: Patient is a 68 year old male with a progressive onset of left hip pain and pinching over the past year.  He reports difficulty putting on his shoes and with sitting.  He reports his hips get sore when he sits for too long.  He presents with increased pain with hip flexion past 90, internal rotation, and external rotation.  He has tenderness to palpation in his TFL.  He was given a base hip stability program to work on at home.  He is unsure if he is going to come back for a follow-up.  He will work on his exercises and call back as needed.  He also presents with decreased single-leg stability.  The avid runner.  We talked about improving that single-leg stability to improve his lateral movement when running.  This should help reduce his pain.  He plans to have his labrum repaired sometime in 2025.  He may also benefit from an aquatic visit.  Therapy will follow-up per patient.  OBJECTIVE IMPAIRMENTS: decreased activity tolerance, decreased ROM, increased muscle spasms, and pain.   ACTIVITY LIMITATIONS: sitting and dressing  PARTICIPATION LIMITATIONS: driving, community activity, and occupation  PERSONAL FACTORS: None  REHAB POTENTIAL: Good  CLINICAL DECISION MAKING: Stable/uncomplicated  EVALUATION  COMPLEXITY: Low   GOALS: Goals reviewed with patient? Yes  STG equals LTG   LONG TERM GOALS: Target date: POC date    Patient will sit for greater than an hour at work without increased left hip pain Baseline:  Goal status: continues to have pain sitting 12/20  2.  Patient will put on his shoes without hip pain Baseline:  Goal status: continues to have difficulty 12/20   3.  Patient will demonstrate 120 degrees of passive hip flexion in order to perform deep bending activities at home Baseline:  Goal status: continuesto have difficulty 12/20   PLAN:  PT FREQUENCY: 2x/week  PT DURATION: 8 weeks  PLANNED INTERVENTIONS: Therapeutic exercises, Therapeutic activity, Neuromuscular re-education, Balance training, Gait training, Patient/Family education, Self Care, Joint mobilization, Stair training, DME instructions, Aquatic Therapy, Dry Needling, Electrical stimulation, Cryotherapy, Moist heat, Taping, Manual therapy, and Re-evaluation.   PLAN FOR NEXT SESSION:  Consider instability on Airex.  Consider monster walk.  Continue with manual therapy to anterior hip.  Consider inferior glide to improve flexion.  Consider progression into gym equipment if patient plans on going to the gym.  Delon Aquas, PTA 04/24/23 12:43 PM [facility] [address] [place], [place], [address] Phone: [phone]   Fax:  [phone]

## 2023-04-26 ENCOUNTER — Encounter (HOSPITAL_BASED_OUTPATIENT_CLINIC_OR_DEPARTMENT_OTHER): Payer: Medicare HMO | Admitting: Orthopaedic Surgery

## 2023-04-28 NOTE — Therapy (Signed)
 OUTPATIENT PHYSICAL THERAPY LOWER EXTREMITY TREATMENT   Patient Name: Johnny Boyer MRN: [id number] DOB:November 16, 1955, 68 y.o., male Today's Date: 04/30/2023  END OF SESSION:  PT End of Session - 04/29/23 1627     Visit Number 4    Number of Visits 8    Date for PT Re-Evaluation 05/31/23    PT Start Time 1624    PT Stop Time 1704    PT Time Calculation (min) 40 min    Activity Tolerance Patient tolerated treatment well    Behavior During Therapy [facility] for tasks assessed/performed              Past Medical History:  Diagnosis Date   Hemorrhoids    Hyperlipidemia    Myocardial infarction (HCC) 2009   Sleep apnea    no cpap used did not tolerate, moderate osa   Past Surgical History:  Procedure Laterality Date   CARDIAC CATHETERIZATION  2009   stents to mid rca x 2   EVALUATION UNDER ANESTHESIA WITH HEMORRHOIDECTOMY N/A 04/21/2020   Procedure: HEMORRHOIDECTOMY X3 WITH LIGATION AND HEMORRHOIDOPEXY ANORECTAL EXAMINATION UNDER ANESTHESIA;  Surgeon: Sheldon Standing, MD;  Location: [facility];  Service: General;  Laterality: N/A;   FRACTURE SURGERY  1974   right wrist   SPINE SURGERY  2008 and 2009   cervical C 3 C4 C5 fusion   Patient Active Problem List   Diagnosis Date Noted   History of coronary artery disease 12/15/2019   External hemorrhoids 12/15/2019   Dyslipidemia 12/15/2019    PCP: Emil Lev MD  REFERRING PROVIDER: Dr Standing Parker   REFERRING DIAG: Left Hip Pain   THERAPY DIAG:  Pain in left hip  Stiffness of left hip, not elsewhere classified  Rationale for Evaluation and Treatment: Rehabilitation  ONSET DATE: Over the course of this year  SUBJECTIVE:   SUBJECTIVE STATEMENT: Pt states his pain is the same, maybe worse.  Pt states he had increased pain that lasted 3 days after prior Rx.  Pt reports he had some relief from manual therapy the session before last which lasted for about 1 day.  Pt reports compliance with HEP.   Pt  reports he doesn't have pain when he runs.  He has pain with ER motions.    MI 2009   PAIN:  Are you having pain? Yes: NPRS scale: 3-4/10 Pain location: deep in bilat hips and lateral hips Pain description: aching  Aggravating factors: sitting and crossing legs  Relieving factors: Stretch in the morning   PRECAUTIONS: None  RED FLAGS: None   WEIGHT BEARING RESTRICTIONS: No  FALLS:  Has patient fallen in last 6 months? No  LIVING ENVIRONMENT: No steps in his house OCCUPATION:  Works for a [employer]. Does a lot of walking and also sits at a computer.  Plans to retire in May.   Hobbies: running  Swimming    PLOF: Independent  PATIENT GOALS:  To get a plan for strengthening and stretching   NEXT MD VISIT:    OBJECTIVE:   DIAGNOSTIC FINDINGS:  MPRESSION: 1. Severe left superior labral degeneration with a 4 mm superior paralabral cyst likely reflecting a small occult tear. 2. Partial-thickness cartilage loss of the femoral head and acetabulum bilateral consistent with mild osteoarthritis. 3. Mild tendinosis of the left hamstring origin with a tiny partial thickness tear.  PATIENT SURVEYS:  Potential 1x visit  COGNITION: Overall cognitive status: Within functional limits for tasks assessed     SENSATION:  PALPATION: Tender to palpaation in the left TFL   LOWER EXTREMITY ROM:  Passive ROM Right eval Left eval Left  12/20  Hip flexion  Painful passed 90 degrees  Painful passed 90 degrees   Hip extension     Hip abduction     Hip adduction     Hip internal rotation  Painful passed 5 degrees  Painful passed neutral  Hip external rotation  Painful passed 60 degrees  Painful passed 65 degrees   Knee flexion     Knee extension     Ankle dorsiflexion     Ankle plantarflexion     Ankle inversion     Ankle eversion      (Blank rows = not tested)  LOWER EXTREMITY MMT:  MMT Right eval Left eval Right  left  Hip flexion 41.1 43.7 42.0  33.0  Hip extension      Hip abduction 55.9 51.0 39.9 39.1  Hip adduction      Hip internal rotation      Hip external rotation      Knee flexion      Knee extension   48.0 45.0  Ankle dorsiflexion      Ankle plantarflexion      Ankle inversion      Ankle eversion       (Blank rows = not tested)    FUNCTIONAL TESTS:  SLS limited bilateral   Squat 3x minimal pain   GAIT: No gait deviations  TODAY'S TREATMENT:                                                                                                                              DATE:  04/29/23 Manual Therapy: inferior and posterior glides to L hip in supine with knee bent ; rolled L quad with roller   Therapeutic Exercise: Bridge with band blue 2x20  Supine SL bridge x10 Heel taps 2 x 10  4 inch step Eccentric step down 2 inch x10   Neuro Re-ed Activities: SLS x 5 sec, 8 sec, and 10 sec Step up on airex fwd and lateral 2x10 each with contralateral LE assist or UE assist on rail for LOB   04/24/23 - Xband walk with blue band, 96ft R/L x 2 - single leg squat with opp leg sliding out side, back, curtsy x 10 each LE - single leg heel taps from 2 step with light UE on counter x 10 each LE (alternating) - cues for hip hinge to reduce knee discomfort - seated hip flexor stretch at edge of table, with leg in ext x 15s x 2 each LE - seated hamstring stretch x 15s x 2 reps - bridge with 10 sec hold, x 5 with small pillow under head - SLS each LE, able to hold up to 5 sec  Self care: self massage with ball against wall to hip musculature, back musculature  12/31 Manual: interior and posterior glides; anterior trigger point  release; Side lying anteriro hip stretch   Bridge with band blue 2x20   Cable walk 20 lbs x10   Eccentric step down 2inch 2x15 each side   Step onto air-ex 2x20 sec hold.    12/20 Assessed patients balance   Bridge with band 2x20 green   Single leg bridge 2x12  Hip abduction 2x20  Side  stepping with band x20   [facility]one drill x20    PATIENT EDUCATION:  Education details: HEP,  progression of exercises, Person educated: Patient Education method: Explanation, Demonstration, Tactile cues, Verbal cues, and Handouts Education comprehension: verbalized understanding, returned demonstration, verbal cues required, tactile cues required, and needs further education  HOME EXERCISE PROGRAM: Access Code: [email or web address] URL: [email or web address] Date: 04/24/2023 Prepared by: [facility] - Outpatient Re[facility] - [address]  Exercises - Seated Hip Flexor Stretch  - 2 x daily - 7 x weekly - 2 reps - 15 hold (alternative option) - Modified Thomas Stretch  - 1 x daily - 7 x weekly - 2 reps - 20 hold - Supine Bridge  - 1 x daily - 7 x weekly - 3 sets - 10 reps - Single Leg Bridge  - 1 x daily - 7 x weekly - 3 sets - 10 reps - Single Leg Stance with Diagonal Forward Reach  - 1 x daily - 7 x weekly - 3 sets - 10 reps - Side Stepping/ Forward stepping with band   - 1 x daily - 7 x weekly - 3 sets - 10 reps - Forward Step Down Touch with Heel  - 1 x daily - 7 x weekly - 3 sets - 10 reps - Single Leg Stance  - 1 x daily - 7 x weekly - 2-3 reps - 10-30 seconds  hold  ASSESSMENT:  CLINICAL IMPRESSION: Pt presents to Rx stating his pain is about the same, maybe worse.  Pt has some tenderness in L quad and is more tender in quad than anterior hip.  PT used roller in L quad and pt had good tolerance.  PT performed exercises to improve glute strength, proprioception, and hip stability.  Pt had difficulty with lateral step up on airex requiring contralateral LE assist for support frequently t/o exercise.  Pt responded well to Rx reporting no increased pain after Rx.     OBJECTIVE IMPAIRMENTS: decreased activity tolerance, decreased ROM, increased muscle spasms, and pain.   ACTIVITY LIMITATIONS: sitting and dressing  PARTICIPATION LIMITATIONS: driving, community activity, and  occupation  PERSONAL FACTORS: None  REHAB POTENTIAL: Good  CLINICAL DECISION MAKING: Stable/uncomplicated  EVALUATION COMPLEXITY: Low   GOALS: Goals reviewed with patient? Yes  STG equals LTG   LONG TERM GOALS: Target date: POC date    Patient will sit for greater than an hour at work without increased left hip pain Baseline:  Goal status: continues to have pain sitting 12/20  2.  Patient will put on his shoes without hip pain Baseline:  Goal status: continues to have difficulty 12/20   3.  Patient will demonstrate 120 degrees of passive hip flexion in order to perform deep bending activities at home Baseline:  Goal status: continuesto have difficulty 12/20   PLAN:  PT FREQUENCY: 2x/week  PT DURATION: 8 weeks  PLANNED INTERVENTIONS: Therapeutic exercises, Therapeutic activity, Neuromuscular re-education, Balance training, Gait training, Patient/Family education, Self Care, Joint mobilization, Stair training, DME instructions, Aquatic Therapy, Dry Needling, Electrical stimulation, Cryotherapy, Moist heat, Taping, Manual therapy, and Re-evaluation.   PLAN FOR NEXT SESSION:  Consider  instability on Airex.  Consider monster walk.  Continue with manual therapy to anterior hip.  Consider inferior glide to improve flexion.  Consider progression into gym equipment if patient plans on going to the gym.  Leigh Minerva III PT, DPT 04/30/23 1:02 PM

## 2023-04-29 ENCOUNTER — Encounter (HOSPITAL_BASED_OUTPATIENT_CLINIC_OR_DEPARTMENT_OTHER): Payer: Self-pay | Admitting: Physical Therapy

## 2023-04-29 ENCOUNTER — Ambulatory Visit (HOSPITAL_BASED_OUTPATIENT_CLINIC_OR_DEPARTMENT_OTHER): Payer: Medicare HMO | Admitting: Physical Therapy

## 2023-04-29 DIAGNOSIS — M25652 Stiffness of left hip, not elsewhere classified: Secondary | ICD-10-CM

## 2023-04-29 DIAGNOSIS — M25552 Pain in left hip: Secondary | ICD-10-CM | POA: Diagnosis not present

## 2023-04-29 DIAGNOSIS — M25852 Other specified joint disorders, left hip: Secondary | ICD-10-CM | POA: Diagnosis not present

## 2023-05-06 ENCOUNTER — Ambulatory Visit (HOSPITAL_BASED_OUTPATIENT_CLINIC_OR_DEPARTMENT_OTHER): Payer: Medicare HMO | Admitting: Physical Therapy

## 2023-05-06 DIAGNOSIS — M25552 Pain in left hip: Secondary | ICD-10-CM

## 2023-05-06 DIAGNOSIS — M25652 Stiffness of left hip, not elsewhere classified: Secondary | ICD-10-CM

## 2023-05-06 DIAGNOSIS — M25852 Other specified joint disorders, left hip: Secondary | ICD-10-CM | POA: Diagnosis not present

## 2023-05-06 NOTE — Therapy (Signed)
OUTPATIENT PHYSICAL THERAPY LOWER EXTREMITY TREATMENT   Patient Name: Johnny Boyer MRN: [id number] DOB:Dec 28, 1955, 68 y.o., male Today's Date: 05/07/2023  END OF SESSION:  PT End of Session - 05/06/23 1719     Visit Number 5    Number of Visits 8    Date for PT Re-Evaluation 05/31/23    Authorization Type [employer]ET[employer] MCR    PT Start Time 1628    PT Stop Time 1710    PT Time Calculation (min) 42 min    Activity Tolerance Patient tolerated treatment well    Behavior During Therapy WFL for tasks assessed/performed               Past Medical History:  Diagnosis Date   Hemorrhoids    Hyperlipidemia    Myocardial infarction (HCC) 2009   Sleep apnea    no cpap used did not tolerate, moderate osa   Past Surgical History:  Procedure Laterality Date   CARDIAC CATHETERIZATION  2009   stents to mid rca x 2   EVALUATION UNDER ANESTHESIA WITH HEMORRHOIDECTOMY N/A 04/21/2020   Procedure: HEMORRHOIDECTOMY X3 WITH LIGATION AND HEMORRHOIDOPEXY ANORECTAL EXAMINATION UNDER ANESTHESIA;  Surgeon: Karie Soda, MD;  Location: [facility];  Service: General;  Laterality: N/A;   FRACTURE SURGERY  1974   right wrist   SPINE SURGERY  2008 and 2009   cervical C 3 C4 C5 fusion   Patient Active Problem List   Diagnosis Date Noted   History of coronary artery disease 12/15/2019   External hemorrhoids 12/15/2019   Dyslipidemia 12/15/2019    PCP: Altamese Dilling MD  REFERRING PROVIDER: Dr Huel Cote   REFERRING DIAG: Left Hip Pain   THERAPY DIAG:  Pain in left hip  Stiffness of left hip, not elsewhere classified  Rationale for Evaluation and Treatment: Rehabilitation  ONSET DATE: Over the course of this year  SUBJECTIVE:   SUBJECTIVE STATEMENT: Pt states his pain is the same, maybe worse.  Pt denies any adverse effects after prior Rx.  Pt reports he has been performing his HEP.  He notices the pain more when he bends over or if he crosses his leg to tie shoes.   Pt reports he doesn't have pain when he runs.  PERTINENT HISTORY: MI 2009   PAIN:  Are you having pain? Yes: NPRS scale: 3/10 on L, 4-5/10 on R Pain location: deep in bilat hips and lateral hips Pain description: aching  Aggravating factors: sitting and crossing legs  Relieving factors: Stretch in the morning   PRECAUTIONS: None  RED FLAGS: None   WEIGHT BEARING RESTRICTIONS: No  FALLS:  Has patient fallen in last 6 months? No  LIVING ENVIRONMENT: No steps in his house OCCUPATION:  Works for a [employer]. Does a lot of walking and also sits at a computer.  Plans to retire in May.   Hobbies: running  Swimming    PLOF: Independent  PATIENT GOALS:  To get a plan for strengthening and stretching   NEXT MD VISIT:    OBJECTIVE:   DIAGNOSTIC FINDINGS:  MPRESSION: 1. Severe left superior labral degeneration with a 4 mm superior paralabral cyst likely reflecting a small occult tear. 2. Partial-thickness cartilage loss of the femoral head and acetabulum bilateral consistent with mild osteoarthritis. 3. Mild tendinosis of the left hamstring origin with a tiny partial thickness tear.  PATIENT SURVEYS:  Potential 1x visit  COGNITION: Overall cognitive status: Within functional limits for tasks assessed  SENSATION:      PALPATION: Tender to palpaation in the left TFL   LOWER EXTREMITY ROM:  Passive ROM Right eval Left eval Left  12/20  Hip flexion  Painful passed 90 degrees  Painful passed 90 degrees   Hip extension     Hip abduction     Hip adduction     Hip internal rotation  Painful passed 5 degrees  Painful passed neutral  Hip external rotation  Painful passed 60 degrees  Painful passed 65 degrees   Knee flexion     Knee extension     Ankle dorsiflexion     Ankle plantarflexion     Ankle inversion     Ankle eversion      (Blank rows = not tested)  LOWER EXTREMITY MMT:  MMT Right eval Left eval Right  left  Hip flexion 41.1  43.7 42.0 33.0  Hip extension      Hip abduction 55.9 51.0 39.9 39.1  Hip adduction      Hip internal rotation      Hip external rotation      Knee flexion      Knee extension   48.0 45.0  Ankle dorsiflexion      Ankle plantarflexion      Ankle inversion      Ankle eversion       (Blank rows = not tested)    FUNCTIONAL TESTS:  SLS limited bilateral   Squat 3x minimal pain   GAIT: No gait deviations   TODAY'S TREATMENT:                                                                                                                              DATE:  05/06/2023 Manual Therapy: inferior and posterior glides to L hip in supine with knee bent ; rolled L quad with roller and TPR to L lateral quad  Therapeutic Exercise: Bridge with band blue 2x20  Heel taps 2 x 10  4 inch step bilat Side lying anterior hip flexor stretch 2x30 sec     Neuro Re-ed Activities: Star reaches 2x5 L LE (ant, lat, post) Step up on airex fwd and lateral 2x10 each with contralateral LE assist or UE assist on rail for LOB   04/29/23   Therapeutic Exercise: Bridge with band blue 2x20  Supine SL bridge x10 Heel taps 2 x 10  4 inch step Eccentric step down 2 inch x10   Neuro Re-ed Activities: SLS x 5 sec, 8 sec, and 10 sec Step up on airex fwd and lateral 2x10 each with contralateral LE assist or UE assist on rail for LOB   04/24/23 - Xband walk with blue band, 40ft R/L x 2 - single leg squat with opp leg sliding out side, back, curtsy x 10 each LE - single leg heel taps from 2" step with light UE on counter x 10 each LE (alternating) - cues for hip hinge to reduce  knee discomfort - seated hip flexor stretch at edge of table, with leg in ext x 15s x 2 each LE - seated hamstring stretch x 15s x 2 reps - bridge with 10 sec hold, x 5 with small pillow under head - SLS each LE, able to hold up to 5 sec  Self care: self massage with ball against wall to hip musculature, back musculature  12/31 Manual:  interior and posterior glides; anterior trigger point release; Side lying anteriro hip stretch   Bridge with band blue 2x20   Cable walk 20 lbs x10   Eccentric step down 2inch 2x15 each side   Step onto air-ex 2x20 sec hold.    12/20 Assessed patients balance   Bridge with band 2x20 green   Single leg bridge 2x12  Hip abduction 2x20  Side stepping with band x20   [facility] drill x20    PATIENT EDUCATION:  Education details: HEP,  progression of exercises, exercise form Person educated: Patient Education method: Explanation, Demonstration, Tactile cues, Verbal cues, and Handouts Education comprehension: verbalized understanding, returned demonstration, verbal cues required, tactile cues required, and needs further education  HOME EXERCISE PROGRAM: Access Code: [email or web address] URL: [email or web address] Date: 04/24/2023 Prepared by: [facility] - [facility] Re[facility] - [address]  Exercises - Seated Hip Flexor Stretch  - 2 x daily - 7 x weekly - 2 reps - 15 hold (alternative option) - Modified Thomas Stretch  - 1 x daily - 7 x weekly - 2 reps - 20 hold - Supine Bridge  - 1 x daily - 7 x weekly - 3 sets - 10 reps - Single Leg Bridge  - 1 x daily - 7 x weekly - 3 sets - 10 reps - Single Leg Stance with Diagonal Forward Reach  - 1 x daily - 7 x weekly - 3 sets - 10 reps - Side Stepping/ Forward stepping with band   - 1 x daily - 7 x weekly - 3 sets - 10 reps - Forward Step Down Touch with Heel  - 1 x daily - 7 x weekly - 3 sets - 10 reps - Single Leg Stance  - 1 x daily - 7 x weekly - 2-3 reps - 10-30 seconds  hold  ASSESSMENT:  CLINICAL IMPRESSION: Pt presents to Rx stating his pain is about the same, maybe worse.  Pt had some tenderness and tightness in L quad.  PT used roller in L quad to improve tightness and soft tissue mobility.  Pt had a trigger point in lateral quad and PT performed TPR to quad.     PT performed exercises to improve glute strength, proprioception, and  hip stability.  Pt had LOB with lateral step up on airex requiring UE assist on rail and PT assistance for LOB.  Pt responded well to Rx reporting no change in L hip pain after Rx.      OBJECTIVE IMPAIRMENTS: decreased activity tolerance, decreased ROM, increased muscle spasms, and pain.   ACTIVITY LIMITATIONS: sitting and dressing  PARTICIPATION LIMITATIONS: driving, community activity, and occupation  PERSONAL FACTORS: None  REHAB POTENTIAL: Good  CLINICAL DECISION MAKING: Stable/uncomplicated  EVALUATION COMPLEXITY: Low   GOALS: Goals reviewed with patient? Yes  STG equals LTG   LONG TERM GOALS: Target date: POC date    Patient will sit for greater than an hour at work without increased left hip pain Baseline:  Goal status: continues to have pain sitting 12/20  2.  Patient will put on his shoes without  hip pain Baseline:  Goal status: continues to have difficulty 12/20   3.  Patient will demonstrate 120 degrees of passive hip flexion in order to perform deep bending activities at home Baseline:  Goal status: continuesto have difficulty 12/20   PLAN:  PT FREQUENCY: 2x/week  PT DURATION: 8 weeks  PLANNED INTERVENTIONS: Therapeutic exercises, Therapeutic activity, Neuromuscular re-education, Balance training, Gait training, Patient/Family education, Self Care, Joint mobilization, Stair training, DME instructions, Aquatic Therapy, Dry Needling, Electrical stimulation, Cryotherapy, Moist heat, Taping, Manual therapy, and Re-evaluation.   PLAN FOR NEXT SESSION:  Consider instability on Airex.  Consider monster walk.  Continue with manual therapy to anterior hip.  Consider inferior glide to improve flexion.  Consider progression into gym equipment if patient plans on going to the gym.  Audie Clear III PT, DPT 05/07/23 8:35 PM

## 2023-05-07 ENCOUNTER — Encounter (HOSPITAL_BASED_OUTPATIENT_CLINIC_OR_DEPARTMENT_OTHER): Payer: Self-pay | Admitting: Physical Therapy

## 2023-05-12 NOTE — Therapy (Addendum)
 OUTPATIENT PHYSICAL THERAPY LOWER EXTREMITY TREATMENT  Progress Note Reporting Period 12/18/2022 to 05/14/2023  See note below for Objective Data and Assessment of Progress/Goals.       Patient Name: Johnny Boyer MRN: [id number] DOB:[birth year], 68 y.o., male Today's Date: 05/14/2023  END OF SESSION:  PT End of Session - 05/13/23 1636     Visit Number 7    Number of Visits 8    Date for PT Re-Evaluation 05/31/23    Authorization Type AET[employer] MCR    PT Start Time 1625    PT Stop Time 1712    PT Time Calculation (min) 47 min    Activity Tolerance Patient tolerated treatment well    Behavior During Therapy [facility] for tasks assessed/performed                Past Medical History:  Diagnosis Date   Hemorrhoids    Hyperlipidemia    Myocardial infarction (HCC) 2009   Sleep apnea    no cpap used did not tolerate, moderate osa   Past Surgical History:  Procedure Laterality Date   CARDIAC CATHETERIZATION  2009   stents to mid rca x 2   EVALUATION UNDER ANESTHESIA WITH HEMORRHOIDECTOMY N/A 04/21/2020   Procedure: HEMORRHOIDECTOMY X3 WITH LIGATION AND HEMORRHOIDOPEXY ANORECTAL EXAMINATION UNDER ANESTHESIA;  Surgeon: Karie Soda, MD;  Location: [facility];  Service: General;  Laterality: N/A;   FRACTURE SURGERY  1974   right wrist   SPINE SURGERY  2008 and 2009   cervical C 3 C4 C5 fusion   Patient Active Problem List   Diagnosis Date Noted   History of coronary artery disease 12/15/2019   External hemorrhoids 12/15/2019   Dyslipidemia 12/15/2019    PCP: Altamese Dilling MD  REFERRING PROVIDER: Dr Huel Cote   REFERRING DIAG: Left Hip Pain   THERAPY DIAG:  Pain in left hip  Stiffness of left hip, not elsewhere classified  Rationale for Evaluation and Treatment: Rehabilitation  ONSET DATE: Over the course of this year  SUBJECTIVE:   SUBJECTIVE STATEMENT: Pt received his Covid vaccine on Friday.  He states everywhere below his hairline  hurt worse.  He states his hip pain was up 2 or 3 more levels.  He reports no improvement in his bilat hip pain.  Pt states running up hills is getting easier.  He states his muscle strength is better though his pain is not.  Pt denies any adverse effects after prior Rx.  Pt reports he has been performing his HEP.  He notices the pain more when he bends over or if he crosses his leg to tie shoes.  He continues to have pain with donning pants.  Pt reports he doesn't have pain when he runs.  Pt notices anterior hip pain 5-6/10 when getting out of car driving to work.   PERTINENT HISTORY: MI 2009   PAIN:  Are you having pain? Yes: NPRS scale: 4/10  Pain location: anterior > lateral hips Pain description: aching  Aggravating factors: sitting and crossing legs  Relieving factors: Stretch in the morning   PRECAUTIONS: None  RED FLAGS: None   WEIGHT BEARING RESTRICTIONS: No  FALLS:  Has patient fallen in last 6 months? No  LIVING ENVIRONMENT: No steps in his house OCCUPATION:  Works for a [employer]. Does a lot of walking and also sits at a computer.  Plans to retire in May.   Hobbies: running  Swimming    PLOF: Independent  PATIENT GOALS:  To get a plan for strengthening and stretching   NEXT MD VISIT:    OBJECTIVE:   DIAGNOSTIC FINDINGS:  MPRESSION: 1. Severe left superior labral degeneration with a 4 mm superior paralabral cyst likely reflecting a small occult tear. 2. Partial-thickness cartilage loss of the femoral head and acetabulum bilateral consistent with mild osteoarthritis. 3. Mild tendinosis of the left hamstring origin with a tiny partial thickness tear.  TODAY'S TREATMENT:                                                                                                                              DATE:  05/13/2023   LOWER EXTREMITY ROM:  Passive ROM Right eval Left eval Left  12/20 Left 1/27 Right 1/27  Hip flexion  Painful passed 90 degrees   Painful passed 90 degrees  Painful at 99 deg Painful at 99 deg  Hip extension       Hip abduction       Hip adduction       Hip internal rotation  Painful passed 5 degrees  Painful passed neutral    Hip external rotation  Painful passed 60 degrees  Painful passed 65 degrees     Knee flexion       Knee extension       Ankle dorsiflexion       Ankle plantarflexion       Ankle inversion       Ankle eversion        (Blank rows = not tested)  LOWER EXTREMITY MMT:  MMT Right eval Left eval Right  left Right 1/27 Left 1/27  Hip flexion 41.1 43.7 42.0 33.0 46.2 48.2  Hip extension        Hip abduction 55.9 51.0 39.9 39.1 43.4 47.8  Hip adduction        Hip internal rotation        Hip external rotation        Knee flexion        Knee extension   48.0 45.0    Ankle dorsiflexion        Ankle plantarflexion        Ankle inversion        Ankle eversion         (Blank rows = not tested)    FUNCTIONAL TESTS:  SLS:   R:  8, 17 sec  L:  11, 11 sec  Squat x 10 reps without pain.   Manual Therapy: rolled L quad with roller and TPR to L quad in supine  Neuro Re-ed Activities: Star reaches 2x5 bilat (ant, lat, post) Step up on airex fwd and lateral 2x10 each with contralateral LE assist or UE assist on rail for LOB     05/06/2023 Manual Therapy: inferior and posterior glides to L hip in supine with knee bent ; rolled L quad with roller and TPR to L lateral quad  Therapeutic Exercise: Bridge with band  blue 2x20  Heel taps 2 x 10  4 inch step bilat Side lying anterior hip flexor stretch 2x30 sec     Neuro Re-ed Activities: Star reaches 2x5 L LE (ant, lat, post) Step up on airex fwd and lateral 2x10 each with contralateral LE assist or UE assist on rail for LOB   04/29/23   Therapeutic Exercise: Bridge with band blue 2x20  Supine SL bridge x10 Heel taps 2 x 10  4 inch step Eccentric step down 2 inch x10   Neuro Re-ed Activities: SLS x 5 sec, 8 sec, and 10 sec Step  up on airex fwd and lateral 2x10 each with contralateral LE assist or UE assist on rail for LOB   04/24/23 - Xband walk with blue band, 66ft R/L x 2 - single leg squat with opp leg sliding out side, back, curtsy x 10 each LE - single leg heel taps from 2" step with light UE on counter x 10 each LE (alternating) - cues for hip hinge to reduce knee discomfort - seated hip flexor stretch at edge of table, with leg in ext x 15s x 2 each LE - seated hamstring stretch x 15s x 2 reps - bridge with 10 sec hold, x 5 with small pillow under head - SLS each LE, able to hold up to 5 sec  Self care: self massage with ball against wall to hip musculature, back musculature  12/31 Manual: interior and posterior glides; anterior trigger point release; Side lying anteriro hip stretch   Bridge with band blue 2x20   Cable walk 20 lbs x10   Eccentric step down 2inch 2x15 each side   Step onto air-ex 2x20 sec hold.    12/20 Assessed patients balance   Bridge with band 2x20 green   Single leg bridge 2x12  Hip abduction 2x20  Side stepping with band x20   [facility] drill x20    PATIENT EDUCATION:  Education details: HEP,  progression of exercises, exercise form, POC, goal progress Person educated: Patient Education method: Explanation, Demonstration, Tactile cues, Verbal cues, and Handouts Education comprehension: verbalized understanding, returned demonstration, verbal cues required, tactile cues required, and needs further education  HOME EXERCISE PROGRAM: Access Code: [email or web address] URL: [email or web address] Date: 04/24/2023 Prepared by: [facility] - Outpatient Rehab - [address]  Exercises - Seated Hip Flexor Stretch  - 2 x daily - 7 x weekly - 2 reps - 15 hold (alternative option) - Modified Thomas Stretch  - 1 x daily - 7 x weekly - 2 reps - 20 hold - Supine Bridge  - 1 x daily - 7 x weekly - 3 sets - 10 reps - Single Leg Bridge  - 1 x daily - 7 x weekly - 3 sets - 10 reps -  Single Leg Stance with Diagonal Forward Reach  - 1 x daily - 7 x weekly - 3 sets - 10 reps - Side Stepping/ Forward stepping with band   - 1 x daily - 7 x weekly - 3 sets - 10 reps - Forward Step Down Touch with Heel  - 1 x daily - 7 x weekly - 3 sets - 10 reps - Single Leg Stance  - 1 x daily - 7 x weekly - 2-3 reps - 10-30 seconds  hold  ASSESSMENT:  CLINICAL IMPRESSION: Pt reports no improvement in his hip pain.  He continues to have the same pain with bending over or crossing his leg to tie shoes.  He continues to have  the same pain with donning pants.  Pt notices anterior hip pain 5-6/10 when getting out of car driving to work.  Pt is compliant with HEP.  Pt demonstrates improved bilat hip strength in flexion and abd since prior testing.  His L hip is testing stronger than R hip.  Pt initially had minimal pain with 3 reps of squatting though had no pain with 10 reps today.  He has not met any of his goals and has not made any progress with hip pain.  PT educated pt concerning objective findings, goals, and POC.       OBJECTIVE IMPAIRMENTS: decreased activity tolerance, decreased ROM, increased muscle spasms, and pain.   ACTIVITY LIMITATIONS: sitting and dressing  PARTICIPATION LIMITATIONS: driving, community activity, and occupation  PERSONAL FACTORS: None  REHAB POTENTIAL: Good  CLINICAL DECISION MAKING: Stable/uncomplicated  EVALUATION COMPLEXITY: Low   GOALS: Goals reviewed with patient? Yes  STG equals LTG   LONG TERM GOALS: Target date: POC date    Patient will sit for greater than an hour at work without increased left hip pain Baseline:  Goal status: NOT MET  1/27  2.  Patient will put on his shoes without hip pain Baseline:  Goal status: NOT MET 1/27   3.  Patient will demonstrate 120 degrees of passive hip flexion in order to perform deep bending activities at home Baseline:  Goal status: NOT MET 1/27   PLAN:  PT FREQUENCY: 2x/week  PT DURATION: 8  weeks  PLANNED INTERVENTIONS: Therapeutic exercises, Therapeutic activity, Neuromuscular re-education, Balance training, Gait training, Patient/Family education, Self Care, Joint mobilization, Stair training, DME instructions, Aquatic Therapy, Dry Needling, Electrical stimulation, Cryotherapy, Moist heat, Taping, Manual therapy, and Re-evaluation.   PLAN FOR NEXT SESSION:  Pt may return for 1 more visit or discharge.  Pt may return to see MD.    Audie Clear III PT, DPT 05/14/23 6:47 PM  PHYSICAL THERAPY DISCHARGE SUMMARY  Visits from Start of Care: 7  Current functional level related to goals / functional outcomes: See above   Remaining deficits: See above   Education / Equipment: HEP   Patient returned to MD and had surgery on 2/18.  Pt will be discharged due to no improvement in pain and having surgery.   Audie Clear III PT, DPT 06/07/23 8:23 AM

## 2023-05-13 ENCOUNTER — Ambulatory Visit (HOSPITAL_BASED_OUTPATIENT_CLINIC_OR_DEPARTMENT_OTHER): Payer: Medicare HMO | Admitting: Physical Therapy

## 2023-05-13 DIAGNOSIS — M25852 Other specified joint disorders, left hip: Secondary | ICD-10-CM | POA: Diagnosis not present

## 2023-05-13 DIAGNOSIS — M25552 Pain in left hip: Secondary | ICD-10-CM

## 2023-05-13 DIAGNOSIS — M25652 Stiffness of left hip, not elsewhere classified: Secondary | ICD-10-CM | POA: Diagnosis not present

## 2023-05-14 ENCOUNTER — Encounter (HOSPITAL_BASED_OUTPATIENT_CLINIC_OR_DEPARTMENT_OTHER): Payer: Self-pay | Admitting: Physical Therapy

## 2023-05-15 ENCOUNTER — Encounter (HOSPITAL_BASED_OUTPATIENT_CLINIC_OR_DEPARTMENT_OTHER): Payer: Self-pay | Admitting: Orthopaedic Surgery

## 2023-05-15 ENCOUNTER — Encounter (HOSPITAL_BASED_OUTPATIENT_CLINIC_OR_DEPARTMENT_OTHER): Payer: Self-pay | Admitting: Physical Therapy

## 2023-05-15 NOTE — Progress Notes (Signed)
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

## 2023-05-16 ENCOUNTER — Encounter (HOSPITAL_BASED_OUTPATIENT_CLINIC_OR_DEPARTMENT_OTHER): Payer: Medicare HMO | Admitting: Physical Therapy

## 2023-05-20 ENCOUNTER — Encounter (HOSPITAL_BASED_OUTPATIENT_CLINIC_OR_DEPARTMENT_OTHER): Payer: Medicare HMO | Admitting: Physical Therapy

## 2023-05-23 ENCOUNTER — Encounter (HOSPITAL_BASED_OUTPATIENT_CLINIC_OR_DEPARTMENT_OTHER): Payer: Medicare HMO | Admitting: Physical Therapy

## 2023-05-28 ENCOUNTER — Other Ambulatory Visit (HOSPITAL_BASED_OUTPATIENT_CLINIC_OR_DEPARTMENT_OTHER): Payer: Self-pay | Admitting: Orthopaedic Surgery

## 2023-05-28 DIAGNOSIS — M25852 Other specified joint disorders, left hip: Secondary | ICD-10-CM

## 2023-05-29 ENCOUNTER — Encounter (HOSPITAL_BASED_OUTPATIENT_CLINIC_OR_DEPARTMENT_OTHER): Payer: Self-pay | Admitting: Orthopaedic Surgery

## 2023-05-31 ENCOUNTER — Encounter (HOSPITAL_BASED_OUTPATIENT_CLINIC_OR_DEPARTMENT_OTHER): Payer: Self-pay | Admitting: Orthopaedic Surgery

## 2023-06-04 ENCOUNTER — Encounter: Payer: Self-pay | Admitting: Orthopaedic Surgery

## 2023-06-04 ENCOUNTER — Other Ambulatory Visit: Payer: Self-pay | Admitting: Orthopaedic Surgery

## 2023-06-04 DIAGNOSIS — M24852 Other specific joint derangements of left hip, not elsewhere classified: Secondary | ICD-10-CM | POA: Diagnosis not present

## 2023-06-04 DIAGNOSIS — S73192A Other sprain of left hip, initial encounter: Secondary | ICD-10-CM | POA: Diagnosis not present

## 2023-06-04 DIAGNOSIS — M25852 Other specified joint disorders, left hip: Secondary | ICD-10-CM

## 2023-06-04 MED ORDER — IBUPROFEN 800 MG PO TABS: 800.0000 mg | ORAL_TABLET | Freq: Three times a day (TID) | ORAL | 0 refills | Status: AC

## 2023-06-04 MED ORDER — OXYCODONE HCL 5 MG PO TABS
5.0000 mg | ORAL_TABLET | ORAL | 0 refills | Status: AC | PRN
Start: 2023-06-04 — End: ?

## 2023-06-04 MED ORDER — ASPIRIN 325 MG PO TBEC: 325.0000 mg | DELAYED_RELEASE_TABLET | Freq: Every day | ORAL | 0 refills | Status: AC

## 2023-06-04 MED ORDER — ACETAMINOPHEN 500 MG PO TABS: 500.0000 mg | ORAL_TABLET | Freq: Three times a day (TID) | ORAL | 0 refills | Status: AC

## 2023-06-04 NOTE — Progress Notes (Signed)
 Date of Surgery: 06/04/2023  INDICATIONS: Johnny Boyer is a 68 y.o.-year-old male with left hip femoracetabular impingement.  The risk and benefits of the procedure were discussed in detail and documented in the pre-operative evaluation.   PREOPERATIVE DIAGNOSIS: 1. Left hip femoractabular impingement  POSTOPERATIVE DIAGNOSIS: Same.  PROCEDURE: 1. Left hip pincer debridement with labral repair 2. Left hip CAM debridement  SURGEON: Benancio Deeds MD  ASSISTANT: Ardeen Fillers, ATC  ANESTHESIA:  general  IV FLUIDS AND URINE: See anesthesia record.  ANTIBIOTICS: Ancef  ESTIMATED BLOOD LOSS: 10 mL.  IMPLANTS:  2 x nanotak anchors DRAINS: None  CULTURES: None  COMPLICATIONS: none  DESCRIPTION OF PROCEDURE:   Cartilage Intact femoral and acetabular cartilage   Labrum Frayed, torn appearing with Pincer lesion   Boundaries of labral tear Convention (3 o'clock anterior, 9 o'clock posterior) Anterior boundary: 3 o'clock Posterior boundary: 1 o'clock   OPERATIVE REPORT:  The patient was brought to the operating room, placed supine on the operating table, and bony prominences were padded.  The traction boots were applied with padding to ensure that safe traction could be applied through the feet.  The contralateral limb was abducted maximally and light traction was applied.  The operative leg was brought into neutral position.  The flouroscopic c-arm was brought between the legs for an AP image.  The patient was prepped and draped in a sterile fashion.  Time-out was performed and landmarks were identified. Traction was obtained and care was taken to ensure the least amount of force necessary to allow safe access to the joint of 8-50mm.  This was checked with fluoroscopy.    Next we placed an anterolateral portal under the assistance of fluoroscopy.  First, fluoroscopy was used to estimate the trajectory and starting point.  A 5mm incision with a #11 blade was made and a straight  hemostat was used to dilate the portal through the appropriate tract.  We then placed a 14-gauge hypodermic needle with careful technique to be as close to the femoral head as possible and parallel to the sorcele to ensure no iatrogenic damage to the labrum.  This released the negative pressure environment and the amount of traction was adjusted to maintain the 8-34mm of distraction.  A nitinol wire was placed through the needle and flouroscopy was used to ensure it extended to the medial wall of the acetabulum.  The Flowport from [employer] was placed over the wire and the nitinol wire was retracted to just inside the capsule during insertion of the dilator and cannula to minimize the risk of breakage. The arthroscope was placed next and we visualized the anterior triangle.     We then placed the anterior portal under direct visualization using the technique described above.  This was safely placed as well without damage to the labrum or femoral head.  We then switched our arthroscope to the anterior portal to ensure we were not through the labrum - we were safely through the capsule only.  We then proceeded with a transverse capsulotomy connecting the 2 portals in the same plane utilizing the Samurai blade from [employer].  We identified the anterior inferior iliac spine proximally, the psoas tendon medially and the rectus tendon laterally as landmarks.  We then proceeded with a diagnostic arthroscopy - the results can be found in the findings section above.    We then used the 50 degree hip specific radiofrequency device from [employer]. and a 4mm shaver to clear the  superior acetabulum and expose the subspinous region.  Next we exposed the acetabular rim leaving the chondral labral junction intact.  Working from both portals, the acetabular rim/subspinous region was reshaped with 5.5 mm bur consistent with the preoperative three-dimensional imaging.   When adequate reshaping was  obtained we then proceeded with the labral repair. We placed 2 anchors at the 1:00 and 2:30 positions. The sutures were passed using the crescent Nanopass from [employer].  This resulted in anatomic labral repair.  We debrided the loose cartilage at the rim and residual degenerative labral tissue.  Traction was let down with total traction time of 47 minutes.    At this time, the tractions was let down and the CAM lesion was systematically debrided using the diamond burr to bring the alpha angle to acceptable limits within all plains of fluoroscopy.   Finally, we performed a complete capsular closure with tape suture.  She was replaced in the anterior and posterior limb of the reported capsulotomy with excellent apposition. We then removed the arthroscope and closed the incisions with 3-0 nylon simple stitches.  A sterile dressing was applied..  The patient was awakened from anesthesia and transferred to PACU in stable condition. Postoperative care includes:       POSTOPERATIVE PLAN:    Weight bearing as tolerated Formal physical therapy will begin this week. ASA 325 Daily for DVT prophylaxis       Benancio Deeds, MD 4:56 PM

## 2023-06-05 ENCOUNTER — Encounter (HOSPITAL_BASED_OUTPATIENT_CLINIC_OR_DEPARTMENT_OTHER): Payer: Self-pay | Admitting: Physical Therapy

## 2023-06-07 ENCOUNTER — Other Ambulatory Visit: Payer: Self-pay

## 2023-06-07 ENCOUNTER — Ambulatory Visit (HOSPITAL_BASED_OUTPATIENT_CLINIC_OR_DEPARTMENT_OTHER): Payer: Medicare HMO | Attending: Orthopaedic Surgery | Admitting: Physical Therapy

## 2023-06-07 ENCOUNTER — Encounter (HOSPITAL_BASED_OUTPATIENT_CLINIC_OR_DEPARTMENT_OTHER): Payer: Self-pay | Admitting: Physical Therapy

## 2023-06-07 DIAGNOSIS — M25552 Pain in left hip: Secondary | ICD-10-CM | POA: Insufficient documentation

## 2023-06-07 DIAGNOSIS — M25852 Other specified joint disorders, left hip: Secondary | ICD-10-CM | POA: Insufficient documentation

## 2023-06-07 DIAGNOSIS — R262 Difficulty in walking, not elsewhere classified: Secondary | ICD-10-CM | POA: Diagnosis not present

## 2023-06-07 DIAGNOSIS — M6281 Muscle weakness (generalized): Secondary | ICD-10-CM | POA: Insufficient documentation

## 2023-06-07 DIAGNOSIS — M25652 Stiffness of left hip, not elsewhere classified: Secondary | ICD-10-CM | POA: Diagnosis not present

## 2023-06-07 NOTE — Therapy (Signed)
 OUTPATIENT PHYSICAL THERAPY LOWER EXTREMITY EVALUATION   Patient Name: Johnny Boyer MRN: [id number] DOB:January 02, 1956, 68 y.o., male Today's Date: 06/07/2023  END OF SESSION:  PT End of Session - 06/07/23 1034     Visit Number 1    Number of Visits 28    Date for PT Re-Evaluation 08/30/23    Authorization Type [employer]ET[employer] MCR    PT Start Time 0934    PT Stop Time 1026    PT Time Calculation (min) 52 min    Activity Tolerance Patient tolerated treatment well    Behavior During Therapy WFL for tasks assessed/performed             Past Medical History:  Diagnosis Date   Hemorrhoids    Hyperlipidemia    Myocardial infarction (HCC) 2009   Sleep apnea    no cpap used did not tolerate, moderate osa   Past Surgical History:  Procedure Laterality Date   CARDIAC CATHETERIZATION  2009   stents to mid rca x 2   EVALUATION UNDER ANESTHESIA WITH HEMORRHOIDECTOMY N/A 04/21/2020   Procedure: HEMORRHOIDECTOMY X3 WITH LIGATION AND HEMORRHOIDOPEXY ANORECTAL EXAMINATION UNDER ANESTHESIA;  Surgeon: Karie Soda, MD;  Location: [facility];  Service: General;  Laterality: N/A;   FRACTURE SURGERY  1974   right wrist   SPINE SURGERY  2008 and 2009   cervical C 3 C4 C5 fusion   Patient Active Problem List   Diagnosis Date Noted   History of coronary artery disease 12/15/2019   External hemorrhoids 12/15/2019   Dyslipidemia 12/15/2019     REFERRING PROVIDER: Huel Cote, MD  REFERRING DIAG: [phone] (ICD-10-CM) - Hip impingement syndrome, left s/p Left hip pincer debridement with labral repair and CAM debridement  THERAPY DIAG:  Pain in left hip  Muscle weakness (generalized)  Stiffness of left hip, not elsewhere classified  Difficulty in walking, not elsewhere classified  Rationale for Evaluation and Treatment: Rehabilitation  ONSET DATE: DOS  06/03/2022   SUBJECTIVE:   SUBJECTIVE STATEMENT: Pt reports his L hip pain began last year with no specific MOI.  Pt  states his pain progressively worsened.  Pt received PT from September 2024 - January 2025 though had no improvement in pain.  Pt underwent Left hip pincer debridement with labral repair and CAM debridement on 2/18.  Post-op plan note indicated Weight bearing as tolerated and formal physical therapy to begin this week. Pt is limited with his normal functional mobility skills including ambulation and transfers.  He is using his hands to lift leg to get into bed and has difficulty with shower transfers.  Pt has difficulty with dressing and his wife ties his shoes.  Pt unable to run.  Pt is not working since surgery, but plans to work from home on Monday.    PERTINENT HISTORY: Left hip pincer debridement with labral repair and CAM debridement on 2/18 MI 2009 with stent placement Cervical fusion C3-5, 2008 and 2009 Pt states he has bulging discs, L4-5 L knee arthroscopic surgery  PAIN:  NPRS:  2-3/10 current, 1/10 best, 3/10 worst since the surgery Location:  lateral L hip, at incision  PRECAUTIONS: Other: labral repair protocol, cervical fusion  RED FLAGS: None   WEIGHT BEARING RESTRICTIONS: Yes WBAT  FALLS:  Has patient fallen in last 6 months? No  LIVING ENVIRONMENT: Lives with: lives with their spouse Lives in: 1 story apartment Stairs: no Has following equipment at home: bilat crutches, shower chair  OCCUPATION: pt works for a [employer].  50% on feet/50% sitting.  PLOF: Independent  Pt was running 4 days per week, 3 miles per day.    PATIENT GOALS: get back to running, to be able to perform the normal daily activities including tying shoes, reduce pain.  NEXT MD VISIT: 06/19/2023  OBJECTIVE:  Note: Objective measures were completed at Evaluation unless otherwise noted.  DIAGNOSTIC FINDINGS: Pt had a MRI in July 2024 and is post op currently.   PATIENT SURVEYS:  LEFS 22/80  COGNITION: Overall cognitive status: Within functional limits for tasks  assessed      OBSERVATION:  Pt not wearing a brace.  Pt has gauze and tegaderm over portals/incision.  Pt's incision and portals are intact with stiches.  Pt has bruising between portals and incision and has xeroform gauze over incision.  Pt has no signs of infection.   PT removed post op dressings and applied new gauze and tegaderm over portals and incision.  PT educated pt on dressings.      LOWER EXTREMITY ROM:  Not tested  LOWER EXTREMITY MMT:  Strength not tested due to healing constraints and surgical protocol.     GAIT: Assistive device utilized: Crutches Comments: Pt ambulates with bilat crutches with a step through pattern without increased pain.  At times, Pt is picking up his crutches and not using them as much.  He denies any increased pain.                                                                                                                                 TREATMENT DATE:   Pt performed supine quad sets with 5 sec hold x 10 reps, supine glute sets with 5 sec hold x 10 reps, and ankle pumps x 20 reps. Pt received a HEP handout and was educated in correct form and appropriate frequency.  PT instructed pt he should not have pain with HEP.    PATIENT EDUCATION:  Education details: PT educated pt in protocol and post op restrictions and expectations including not actively lifting R LE and not breaking 90 deg hip flexion.  PT educated pt in lying supine.  Wb'ing restrictions, POC, dx, relevant anatomy, HEP, prognosis, gait, and dressings.  Person educated: Patient Education method: Explanation, Demonstration, Tactile cues, Verbal cues, and Handouts Education comprehension: verbalized understanding, returned demonstration, verbal cues required, tactile cues required, and needs further education  HOME EXERCISE PROGRAM: Access Code: [email or web address] URL: [email or web address] Date: 06/07/2023 Prepared by: Aaron Edelman  Exercises - ANKLE PUMPS  - 3-5 x  daily - 7 x weekly - 2-3 sets - 10 reps - Supine Quadricep Sets  - 2 x daily - 7 x weekly - 2 sets - 10 reps - 5 seconds hold  ASSESSMENT:  CLINICAL IMPRESSION: Patient is a 68 y.o. male 3 days s/p s/p Left hip pincer debridement with labral repair and CAM debridement presenting to the clinic with expected post op limitations including  L hip pain, limited hip ROM, muscle weakness, and difficulty in walking.  Pt is limited with his normal functional mobility skills including ambulation and transfers.  Pt has difficulty with dressing.  Pt is an avid runner and is unable to run due to surgery.  He is not performing his normal work activities currently.  Pt should benefit from skilled PT per protocol to address impairments and improve overall function.      OBJECTIVE IMPAIRMENTS: Abnormal gait, decreased activity tolerance, decreased mobility, difficulty walking, decreased ROM, decreased strength, hypomobility, impaired flexibility, and pain.   ACTIVITY LIMITATIONS: carrying, lifting, bending, standing, squatting, stairs, transfers, bed mobility, dressing, and locomotion level  PARTICIPATION LIMITATIONS: cleaning, community activity, occupation, and running  PERSONAL FACTORS: 1 comorbidity: cervical fusion  are also affecting patient's functional outcome.   REHAB POTENTIAL: Good  CLINICAL DECISION MAKING: Stable/uncomplicated  EVALUATION COMPLEXITY: Low   GOALS:   SHORT TERM GOALS:  Pt will be independent and compliant with HEP for improved pain, ROM, strength, and function.  Baseline: Goal status: INITIAL Target date: 07/05/2023  2.  Pt will progress with PROM per protocol without adverse effects for improved stiffness and mobility.  Baseline:  Goal status: INITIAL Target date: 07/05/2023  3.  Pt will wean off of crutches without adverse effects  Baseline:  Goal status: INITIAL Target date:  07/05/2023  4.  Pt will progress with exercises per protocol without adverse effects for  improved strength and mobility.  Baseline:  Goal status: INITIAL Target date:  07/19/2023   5.  Pt will perform a 6 inch step up with good form and good stability.   Baseline:  Goal status: INITIAL Target date:  08/02/23   6.  Pt will ambulate with a normalized heel to toe gait pattern without limping.  Baseline:  Goal status: INITIAL Target date: 08/16/23   7.  Pt will have no pain and demo good form with squatting for improved function strength and to assist with functional mobility.  Baseline:  Goal status: INITIAL Target date:  08/23/2023     LONG TERM GOALS: Target date: 09/26/2023   Pt's L hip AROM will be [facility] for improved stiffness and daily mobility  Baseline:  Goal status: INITIAL  2.  Pt will ambulate extended community distance without increased pain and significant difficulty.  Baseline:  Goal status: INITIAL  3.  Pt will be able to perform all of his ADLs/IADLs and functional mobility skills without significant pain and limitation.  Baseline:  Goal status: INITIAL  4.   Pt will be able to ascend and descend stairs with a reciprocal gait with good control without increased L hip pain.  Baseline:  Goal status: INITIAL  5.  Pt will demo 4+/5 strength in L hip abd and ext, 4 to 4+/5 in L hip flex, and 5/5 in L knee ext for improved performance of and tolerance with functional mobility.  Baseline:  Goal status: INITIAL    PLAN:  PT FREQUENCY:  1x/wk x 3-4 weeks and 2x/wk afterwards  PT DURATION: other: 16 weeks  PLANNED INTERVENTIONS: 97164- PT Re-evaluation, 97110-Therapeutic exercises, 97530- Therapeutic activity, 97112- Neuromuscular re-education, 97535- Self Care, 16109- Manual therapy, [phone]- Gait training, [phone]- Aquatic Therapy, [phone]- Electrical stimulation (unattended), [phone]- Electrical stimulation (manual), [id number]- Ultrasound, Patient/Family education, Balance training, Stair training, Taping, Dry Needling, Joint mobilization, Scar mobilization,  Cryotherapy, and Moist heat  PLAN FOR NEXT SESSION: Cont per Dr. Serena Croissant labral repair protocol.  Hip PROM per protocol.    Marya Lowden  Silvestre Gunner PT, DPT 06/07/23 4:44 PM

## 2023-06-12 ENCOUNTER — Encounter (HOSPITAL_BASED_OUTPATIENT_CLINIC_OR_DEPARTMENT_OTHER): Payer: Self-pay | Admitting: Physical Therapy

## 2023-06-13 NOTE — Therapy (Unsigned)
 OUTPATIENT PHYSICAL THERAPY LOWER EXTREMITY EVALUATION   Patient Name: Johnny [facility] MRN: [id number] DOB:02-25-1956, 68 y.o., male Today's Date: 06/14/2023  END OF SESSION:  PT End of Session - 06/14/23 0709     Visit Number 2    Number of Visits 28    Date for PT Re-Evaluation 08/30/23    Authorization Type AET[employer] MCR    PT Start Time 0715    PT Stop Time 0754    PT Time Calculation (min) 39 min    Activity Tolerance Patient tolerated treatment well    Behavior During Therapy [facility] for tasks assessed/performed              Past Medical History:  Diagnosis Date   Hemorrhoids    Hyperlipidemia    Myocardial infarction (HCC) 2009   Sleep apnea    no cpap used did not tolerate, moderate osa   Past Surgical History:  Procedure Laterality Date   CARDIAC CATHETERIZATION  2009   stents to mid rca x 2   EVALUATION UNDER ANESTHESIA WITH HEMORRHOIDECTOMY N/A 04/21/2020   Procedure: HEMORRHOIDECTOMY X3 WITH LIGATION AND HEMORRHOIDOPEXY ANORECTAL EXAMINATION UNDER ANESTHESIA;  Surgeon: Karie Soda, MD;  Location: [facility];  Service: General;  Laterality: N/A;   FRACTURE SURGERY  1974   right wrist   SPINE SURGERY  2008 and 2009   cervical C 3 C4 C5 fusion   Patient Active Problem List   Diagnosis Date Noted   History of coronary artery disease 12/15/2019   External hemorrhoids 12/15/2019   Dyslipidemia 12/15/2019     REFERRING PROVIDER: Huel Cote, MD  REFERRING DIAG: [phone] (ICD-10-CM) - Hip impingement syndrome, left s/p Left hip pincer debridement with labral repair and CAM debridement  THERAPY DIAG:  Difficulty in walking, not elsewhere classified  Pain in left hip  Muscle weakness (generalized)  Stiffness of left hip, not elsewhere classified  Rationale for Evaluation and Treatment: Rehabilitation  ONSET DATE: DOS  06/03/2022   SUBJECTIVE:   SUBJECTIVE STATEMENT: Pt states that he is feeling well. He has been walking a mile a  day for the past few days. He notes pain in his R hip and lower back and thinks it is due to compensation. Pain on R side started 2-3 weeks prior to surgery and has not subsided since. Pt reports a hx of bulging discs in his lower back.   Pt reports his L hip pain began last year with no specific MOI.  Pt states his pain progressively worsened.  Pt received PT from September 2024 - January 2025 though had no improvement in pain.  Pt underwent Left hip pincer debridement with labral repair and CAM debridement on 2/18.  Post-op plan note indicated Weight bearing as tolerated and formal physical therapy to begin this week. Pt is limited with his normal functional mobility skills including ambulation and transfers.  He is using his hands to lift leg to get into bed and has difficulty with shower transfers.  Pt has difficulty with dressing and his wife ties his shoes.  Pt unable to run.  Pt is not working since surgery, but plans to work from home on Monday.    PERTINENT HISTORY: Left hip pincer debridement with labral repair and CAM debridement on 2/18 MI 2009 with stent placement Cervical fusion C3-5, 2008 and 2009 Pt states he has bulging discs, L4-5 L knee arthroscopic surgery  PAIN:  NPRS:  2-3/10 current, 1/10 best, 3/10 worst since the surgery Location:  lateral  L hip, at incision  PRECAUTIONS: Other: labral repair protocol, cervical fusion  RED FLAGS: None   WEIGHT BEARING RESTRICTIONS: Yes WBAT  FALLS:  Has patient fallen in last 6 months? No  LIVING ENVIRONMENT: Lives with: lives with their spouse Lives in: 1 story apartment Stairs: no Has following equipment at home: bilat crutches, shower chair  OCCUPATION: pt works for a [employer].  50% on feet/50% sitting.  PLOF: Independent  Pt was running 4 days per week, 3 miles per day.    PATIENT GOALS: get back to running, to be able to perform the normal daily activities including tying shoes, reduce pain.  NEXT MD  VISIT: 06/19/2023  OBJECTIVE:  Note: Objective measures were completed at Evaluation unless otherwise noted.  DIAGNOSTIC FINDINGS: Pt had a MRI in July 2024 and is post op currently.   PATIENT SURVEYS:  LEFS 22/80  COGNITION: Overall cognitive status: Within functional limits for tasks assessed      OBSERVATION:  Pt not wearing a brace.  Pt has gauze and tegaderm over portals/incision.  Pt's incision and portals are intact with stiches.  Pt has bruising between portals and incision and has xeroform gauze over incision.  Pt has no signs of infection.   PT removed post op dressings and applied new gauze and tegaderm over portals and incision.  PT educated pt on dressings.      LOWER EXTREMITY ROM:  Not tested  LOWER EXTREMITY MMT:  Strength not tested due to healing constraints and surgical protocol.     GAIT: Assistive device utilized: Crutches Comments: Pt ambulates with bilat crutches with a step through pattern without increased pain.  At times, Pt is picking up his crutches and not using them as much.  He denies any increased pain.                                                                                                                                 TREATMENT DATE:   02/28: There ex:  - LTR - PPT - HS stretch  - quad sets  - hip adduction isometrics in supine - standing hip abduction, ext, bilat  - discussed healing, protocol and signs and symptoms to watch out for during HEP progression.  Manual stretches to R lats, TpR.   Pt performed supine quad sets with 5 sec hold x 10 reps, supine glute sets with 5 sec hold x 10 reps, and ankle pumps x 20 reps. Pt received a HEP handout and was educated in correct form and appropriate frequency.  PT instructed pt he should not have pain with HEP.    PATIENT EDUCATION:  Education details: PT educated pt in protocol and post op restrictions and expectations including not actively lifting R LE and not breaking 90 deg hip  flexion.  PT educated pt in lying supine.  Wb'ing restrictions, POC, dx, relevant anatomy, HEP, prognosis, gait, and dressings.  Person educated: Patient  Education method: Explanation, Demonstration, Tactile cues, Verbal cues, and Handouts Education comprehension: verbalized understanding, returned demonstration, verbal cues required, tactile cues required, and needs further education  HOME EXERCISE PROGRAM: Access Code: [email or web address] URL: [email or web address] Date: 06/07/2023 Prepared by: Aaron Edelman  Exercises - ANKLE PUMPS  - 3-5 x daily - 7 x weekly - 2-3 sets - 10 reps - Supine Quadricep Sets  - 2 x daily - 7 x weekly - 2 sets - 10 reps - 5 seconds hold  ASSESSMENT:  CLINICAL IMPRESSION: Pt reports to first follow up appt with no reported pain from current HEP. He has ongoing lower back pain on his R side potentially due to compensation. Session with focus on lumbar and hip mobility to tolerance. Reviewed HEP and updated HEP with new exercises. Pt tolerated all progressions well with no adverse effects. Per pt request, inspected current coverings, with no concerns present. Discussed signs and symptoms to watch out for with HEP and incision. Pt will continue to benefit from skilled PT to address continued deficits.    Eval: Patient is a 68 y.o. male 3 days s/p s/p Left hip pincer debridement with labral repair and CAM debridement presenting to the clinic with expected post op limitations including L hip pain, limited hip ROM, muscle weakness, and difficulty in walking.  Pt is limited with his normal functional mobility skills including ambulation and transfers.  Pt has difficulty with dressing.  Pt is an avid runner and is unable to run due to surgery.  He is not performing his normal work activities currently.  Pt should benefit from skilled PT per protocol to address impairments and improve overall function.      OBJECTIVE IMPAIRMENTS: Abnormal gait, decreased activity  tolerance, decreased mobility, difficulty walking, decreased ROM, decreased strength, hypomobility, impaired flexibility, and pain.   ACTIVITY LIMITATIONS: carrying, lifting, bending, standing, squatting, stairs, transfers, bed mobility, dressing, and locomotion level  PARTICIPATION LIMITATIONS: cleaning, community activity, occupation, and running  PERSONAL FACTORS: 1 comorbidity: cervical fusion  are also affecting patient's functional outcome.   REHAB POTENTIAL: Good  CLINICAL DECISION MAKING: Stable/uncomplicated  EVALUATION COMPLEXITY: Low   GOALS:   SHORT TERM GOALS:  Pt will be independent and compliant with HEP for improved pain, ROM, strength, and function.  Baseline: Goal status: INITIAL Target date: 07/05/2023  2.  Pt will progress with PROM per protocol without adverse effects for improved stiffness and mobility.  Baseline:  Goal status: INITIAL Target date: 07/05/2023  3.  Pt will wean off of crutches without adverse effects  Baseline:  Goal status: INITIAL Target date:  07/05/2023  4.  Pt will progress with exercises per protocol without adverse effects for improved strength and mobility.  Baseline:  Goal status: INITIAL Target date:  07/19/2023   5.  Pt will perform a 6 inch step up with good form and good stability.   Baseline:  Goal status: INITIAL Target date:  08/02/23   6.  Pt will ambulate with a normalized heel to toe gait pattern without limping.  Baseline:  Goal status: INITIAL Target date: 08/16/23   7.  Pt will have no pain and demo good form with squatting for improved function strength and to assist with functional mobility.  Baseline:  Goal status: INITIAL Target date:  08/23/2023     LONG TERM GOALS: Target date: 09/26/2023   Pt's L hip AROM will be [facility] for improved stiffness and daily mobility  Baseline:  Goal status: INITIAL  2.  Pt will ambulate  extended community distance without increased pain and significant difficulty.   Baseline:  Goal status: INITIAL  3.  Pt will be able to perform all of his ADLs/IADLs and functional mobility skills without significant pain and limitation.  Baseline:  Goal status: INITIAL  4.   Pt will be able to ascend and descend stairs with a reciprocal gait with good control without increased L hip pain.  Baseline:  Goal status: INITIAL  5.  Pt will demo 4+/5 strength in L hip abd and ext, 4 to 4+/5 in L hip flex, and 5/5 in L knee ext for improved performance of and tolerance with functional mobility.  Baseline:  Goal status: INITIAL    PLAN:  PT FREQUENCY:  1x/wk x 3-4 weeks and 2x/wk afterwards  PT DURATION: other: 16 weeks  PLANNED INTERVENTIONS: 97164- PT Re-evaluation, 97110-Therapeutic exercises, 97530- Therapeutic activity, 97112- Neuromuscular re-education, 97535- Self Care, 29562- Manual therapy, [phone]- Gait training, [phone]- Aquatic Therapy, [phone]- Electrical stimulation (unattended), [phone]- Electrical stimulation (manual), [id number]- Ultrasound, Patient/Family education, Balance training, Stair training, Taping, Dry Needling, Joint mobilization, Scar mobilization, Cryotherapy, and Moist heat  PLAN FOR NEXT SESSION: Cont per Dr. Serena Croissant labral repair protocol.  Hip PROM per protocol.   Royal Hawthorn PT, DPT 06/14/23  8:01 AM

## 2023-06-14 ENCOUNTER — Ambulatory Visit (HOSPITAL_BASED_OUTPATIENT_CLINIC_OR_DEPARTMENT_OTHER): Payer: Medicare HMO | Admitting: Physical Therapy

## 2023-06-14 ENCOUNTER — Encounter (HOSPITAL_BASED_OUTPATIENT_CLINIC_OR_DEPARTMENT_OTHER): Payer: Self-pay | Admitting: Physical Therapy

## 2023-06-14 DIAGNOSIS — M25852 Other specified joint disorders, left hip: Secondary | ICD-10-CM | POA: Diagnosis not present

## 2023-06-14 DIAGNOSIS — M25552 Pain in left hip: Secondary | ICD-10-CM

## 2023-06-14 DIAGNOSIS — M25652 Stiffness of left hip, not elsewhere classified: Secondary | ICD-10-CM

## 2023-06-14 DIAGNOSIS — M6281 Muscle weakness (generalized): Secondary | ICD-10-CM

## 2023-06-14 DIAGNOSIS — R262 Difficulty in walking, not elsewhere classified: Secondary | ICD-10-CM

## 2023-06-19 ENCOUNTER — Ambulatory Visit (HOSPITAL_BASED_OUTPATIENT_CLINIC_OR_DEPARTMENT_OTHER): Payer: Medicare HMO | Admitting: Orthopaedic Surgery

## 2023-06-19 DIAGNOSIS — M25852 Other specified joint disorders, left hip: Secondary | ICD-10-CM

## 2023-06-19 NOTE — Progress Notes (Signed)
 Post Operative Evaluation    Procedure/Date of Surgery: Left hip arthroscopy with cam debridement 2/18  Interval History:   Presents today 2 weeks status post above procedure.  Overall he is doing extremely well.  He has minimal to no pain about the hip.  Does experience some soreness when going from sit to stand although this is mild   PMH/PSH/Family History/Social History/Meds/Allergies:    Past Medical History:  Diagnosis Date   Hemorrhoids    Hyperlipidemia    Myocardial infarction (HCC) 2009   Sleep apnea    no cpap used did not tolerate, moderate osa   Past Surgical History:  Procedure Laterality Date   CARDIAC CATHETERIZATION  2009   stents to mid rca x 2   EVALUATION UNDER ANESTHESIA WITH HEMORRHOIDECTOMY N/A 04/21/2020   Procedure: HEMORRHOIDECTOMY X3 WITH LIGATION AND HEMORRHOIDOPEXY ANORECTAL EXAMINATION UNDER ANESTHESIA;  Surgeon: Karie Soda, MD;  Location: [facility] [place];  Service: General;  Laterality: N/A;   FRACTURE SURGERY  1974   right wrist   SPINE SURGERY  2008 and 2009   cervical C 3 C4 C5 fusion   Social History   Socioeconomic History   Marital status: Married    Spouse name: Not on file   Number of children: Not on file   Years of education: Not on file   Highest education level: Doctorate  Occupational History   Not on file  Tobacco Use   Smoking status: Never   Smokeless tobacco: Never  Vaping Use   Vaping status: Never Used  Substance and Sexual Activity   Alcohol use: Yes    Comment: 1 or 2 beers per day   Drug use: Never   Sexual activity: Not on file  Other Topics Concern   Not on file  Social History Narrative   Not on file   Social Drivers of Health   Financial Resource Strain: Low Risk  (10/15/2022)   Overall Financial Resource Strain (CARDIA)    Difficulty of Paying Living Expenses: Not hard at all  Food Insecurity: No Food Insecurity (10/15/2022)   Hunger Vital Sign     Worried About Running Out of Food in the Last Year: Never true    Ran Out of Food in the Last Year: Never true  Transportation Needs: No Transportation Needs (10/15/2022)   PRAPARE - Administrator, Civil Service (Medical): No    Lack of Transportation (Non-Medical): No  Physical Activity: Sufficiently Active (10/15/2022)   Exercise Vital Sign    Days of Exercise per Week: 5 days    Minutes of Exercise per Session: 40 min  Stress: No Stress Concern Present (10/15/2022)   [employer] of Occupational Health - Occupational Stress Questionnaire    Feeling of Stress : Not at all  Social Connections: Moderately Integrated (10/15/2022)   Social Connection and Isolation Panel [NHANES]    Frequency of Communication with Friends and Family: Once a week    Frequency of Social Gatherings with Friends and Family: More than three times a week    Attends Religious Services: Never    Database administrator or Organizations: Yes    Attends Engineer, structural: More than 4 times per year    Marital Status: Married   Family History  Problem Relation Age of Onset  Heart disease Mother    Hyperlipidemia Mother    Hyperlipidemia Sister    Hypertension Sister    Hyperlipidemia Brother    Hypertension Brother    No Known Allergies Current Outpatient Medications  Medication Sig Dispense Refill   aspirin EC 325 MG tablet Take 1 tablet (325 mg total) by mouth daily. 14 tablet 0   aspirin EC 81 MG tablet Take 81 mg by mouth daily.     atorvastatin (LIPITOR) 80 MG tablet TAKE 1 TABLET BY MOUTH EVERY DAY 90 tablet 2   oxyCODONE (ROXICODONE) 5 MG immediate release tablet Take 1 tablet (5 mg total) by mouth every 4 (four) hours as needed for severe pain (pain score 7-10) or breakthrough pain. 15 tablet 0   No current facility-administered medications for this visit.   No results found.  Review of Systems:   A ROS was performed including pertinent positives and negatives as documented  in the HPI.   Musculoskeletal Exam:    There were no vitals taken for this visit.  Left hip incisions are well-appearing.  Range of motion is 30 degrees internal and external rotation about the left hip without pain.  There is excellent abduction strength with no antalgic gait  Imaging:      I personally reviewed and interpreted the radiographs.   Assessment:   2-week status post left hip arthroscopy with cam debridement overall doing extremely well.  At this time we will continue to work on strengthening and progress through the strengthening portion of the protocol.  Plan :    -He will return to clinic in 4 weeks for reassessment      I personally saw and evaluated the patient, and participated in the management and treatment plan.  Huel Cote, MD Attending Physician, Orthopedic Surgery  This document was dictated using Dragon voice recognition software. A reasonable attempt at proof reading has been made to minimize errors.

## 2023-06-20 NOTE — Therapy (Signed)
 OUTPATIENT PHYSICAL THERAPY TREATMENT   Patient Name: Johnny Boyer MRN: [id number] DOB:02-20-1956, 68 y.o., male Today's Date: 06/21/2023  END OF SESSION:  PT End of Session - 06/21/23 1518     Visit Number 3    Number of Visits 28    Date for PT Re-Evaluation 08/30/23    Authorization Type [employer]ET[employer] MCR    Progress Note Due on Visit 10    PT Start Time 1518    PT Stop Time 1601    PT Time Calculation (min) 43 min    Activity Tolerance Patient tolerated treatment well               Past Medical History:  Diagnosis Date   Hemorrhoids    Hyperlipidemia    Myocardial infarction (HCC) 2009   Sleep apnea    no cpap used did not tolerate, moderate osa   Past Surgical History:  Procedure Laterality Date   CARDIAC CATHETERIZATION  2009   stents to mid rca x 2   EVALUATION UNDER ANESTHESIA WITH HEMORRHOIDECTOMY N/A 04/21/2020   Procedure: HEMORRHOIDECTOMY X3 WITH LIGATION AND HEMORRHOIDOPEXY ANORECTAL EXAMINATION UNDER ANESTHESIA;  Surgeon: Karie Soda, MD;  Location: [facility];  Service: General;  Laterality: N/A;   FRACTURE SURGERY  1974   right wrist   SPINE SURGERY  2008 and 2009   cervical C 3 C4 C5 fusion   Patient Active Problem List   Diagnosis Date Noted   History of coronary artery disease 12/15/2019   External hemorrhoids 12/15/2019   Dyslipidemia 12/15/2019     REFERRING PROVIDER: Huel Cote, MD  REFERRING DIAG: [phone] (ICD-10-CM) - Hip impingement syndrome, left s/p Left hip pincer debridement with labral repair and CAM debridement  THERAPY DIAG:  Difficulty in walking, not elsewhere classified  Pain in left hip  Muscle weakness (generalized)  Stiffness of left hip, not elsewhere classified  Rationale for Evaluation and Treatment: Rehabilitation  ONSET DATE: DOS  06/03/2022   SUBJECTIVE:   SUBJECTIVE STATEMENT: 06/21/2023 Pt states he has been doing okay, has been walking at least a mile most days. Doing HEP 2-3 times  day without issue. Still getting some twinges with standing up after prolonged sitting. States follow up with surgeon went well, no concerns.    Per eval - Pt reports his L hip pain began last year with no specific MOI.  Pt states his pain progressively worsened.  Pt received PT from September 2024 - January 2025 though had no improvement in pain.  Pt underwent Left hip pincer debridement with labral repair and CAM debridement on 2/18.  Post-op plan note indicated Weight bearing as tolerated and formal physical therapy to begin this week. Pt is limited with his normal functional mobility skills including ambulation and transfers.  He is using his hands to lift leg to get into bed and has difficulty with shower transfers.  Pt has difficulty with dressing and his wife ties his shoes.  Pt unable to run.  Pt is not working since surgery, but plans to work from home on Monday.    PERTINENT HISTORY: Left hip pincer debridement with labral repair and CAM debridement on 2/18 MI 2009 with stent placement Cervical fusion C3-5, 2008 and 2009 Pt states he has bulging discs, L4-5 L knee arthroscopic surgery  PAIN:  NPRS:  0/10 current, 1/10 best, 3/10 worst since the surgery Location:  lateral L hip, at incision  PRECAUTIONS: Other: labral repair protocol, cervical fusion  RED FLAGS: None  WEIGHT BEARING RESTRICTIONS: Yes WBAT  FALLS:  Has patient fallen in last 6 months? No  LIVING ENVIRONMENT: Lives with: lives with their spouse Lives in: 1 story apartment Stairs: no Has following equipment at home: bilat crutches, shower chair  OCCUPATION: pt works for a [employer].  50% on feet/50% sitting.  PLOF: Independent  Pt was running 4 days per week, 3 miles per day.    PATIENT GOALS: get back to running, to be able to perform the normal daily activities including tying shoes, reduce pain.  NEXT MD VISIT: 07/19/23  OBJECTIVE:  Note: Objective measures were completed at Evaluation  unless otherwise noted.  DIAGNOSTIC FINDINGS: Pt had a MRI in July 2024 and is post op currently.   PATIENT SURVEYS:  LEFS 22/80  COGNITION: Overall cognitive status: Within functional limits for tasks assessed      OBSERVATION:  Pt not wearing a brace.  Pt has gauze and tegaderm over portals/incision.  Pt's incision and portals are intact with stiches.  Pt has bruising between portals and incision and has xeroform gauze over incision.  Pt has no signs of infection.   PT removed post op dressings and applied new gauze and tegaderm over portals and incision.  PT educated pt on dressings.      LOWER EXTREMITY ROM:  Not tested  LOWER EXTREMITY MMT:  Strength not tested due to healing constraints and surgical protocol.     GAIT: Assistive device utilized: Crutches Comments: Pt ambulates with bilat crutches with a step through pattern without increased pain.  At times, Pt is picking up his crutches and not using them as much.  He denies any increased pain.                                                                                                                                 TREATMENT DATE:   [facility] Adult PT Treatment:                                                DATE: 06/21/23 Therapeutic Exercise: PPT x15 Glute set x15 Quad set x15 Mini bridge x15 HS stretch Quadruped rocking 2x10 cues for comfortable ROM  Sidelying clams 2x10 LLE Resting prone stretch 2x30sec HEP update + education    02/28: There ex:  - LTR - PPT - HS stretch  - quad sets  - hip adduction isometrics in supine - standing hip abduction, ext, bilat  - discussed healing, protocol and signs and symptoms to watch out for during HEP progression.  Manual stretches to R lats, TpR.   Pt performed supine quad sets with 5 sec hold x 10 reps, supine glute sets with 5 sec hold x 10 reps, and ankle pumps x 20 reps. Pt received a HEP handout and was educated  in correct form and appropriate frequency.   PT instructed pt he should not have pain with HEP.    PATIENT EDUCATION:  Education details: rationale for interventions, HEP  Person educated: Patient Education method: Explanation, Demonstration, Tactile cues, Verbal cues Education comprehension: verbalized understanding, returned demonstration, verbal cues required, tactile cues required, and needs further education     HOME EXERCISE PROGRAM: Access Code: [email or web address] URL: [email or web address] Date: 06/21/2023 Prepared by: Fransisco Hertz  Exercises - ANKLE PUMPS  - 3-5 x daily - 7 x weekly - 2-3 sets - 10 reps - Supine Quadricep Sets  - 2 x daily - 7 x weekly - 2 sets - 10 reps - 5 seconds hold - Supine Heel Slide  - 2 x daily - 7 x weekly - 2-3 sets - 10 reps - Supine Posterior Pelvic Tilt  - 2 x daily - 7 x weekly - 2-3 sets - 10 reps - Seated Hamstring Stretch  - 2-3 x daily - 7 x weekly - 2-3 sets - 10 reps - Beginner Bridge  - 3-4 x weekly - 1-2 sets - 8-10 reps - Quadruped Rocking Slow  - 1 x daily - 3-4 x weekly - 1-2 sets - 8-10 reps  ASSESSMENT:  CLINICAL IMPRESSION: 06/21/2023 Pt arrives w/o resting pain, continues to endorse steady progress. He notes his back pain has improved with some stretches from prior bout of PT (open books). Today we are able to progress through surgical protocol with addition mini bridges, quadruped rocking, and clamshells, all of which pt does well with. Introducing prone positioning today, progression limited primarily by premorbid back issues but no increase in resting pain with activity. Continues to progress quite well overall. No adverse events or increase in hip pain with activities today, no increase in resting back pain. Recommend continuing along current POC in order to address relevant deficits and improve functional tolerance. Pt departs today's session in no acute distress, all voiced questions/concerns addressed appropriately from PT perspective.     Eval: Patient is a 68 y.o.  male 3 days s/p s/p Left hip pincer debridement with labral repair and CAM debridement presenting to the clinic with expected post op limitations including L hip pain, limited hip ROM, muscle weakness, and difficulty in walking.  Pt is limited with his normal functional mobility skills including ambulation and transfers.  Pt has difficulty with dressing.  Pt is an avid runner and is unable to run due to surgery.  He is not performing his normal work activities currently.  Pt should benefit from skilled PT per protocol to address impairments and improve overall function.      OBJECTIVE IMPAIRMENTS: Abnormal gait, decreased activity tolerance, decreased mobility, difficulty walking, decreased ROM, decreased strength, hypomobility, impaired flexibility, and pain.   ACTIVITY LIMITATIONS: carrying, lifting, bending, standing, squatting, stairs, transfers, bed mobility, dressing, and locomotion level  PARTICIPATION LIMITATIONS: cleaning, community activity, occupation, and running  PERSONAL FACTORS: 1 comorbidity: cervical fusion  are also affecting patient's functional outcome.   REHAB POTENTIAL: Good  CLINICAL DECISION MAKING: Stable/uncomplicated  EVALUATION COMPLEXITY: Low   GOALS:   SHORT TERM GOALS:  Pt will be independent and compliant with HEP for improved pain, ROM, strength, and function.  Baseline: Goal status: INITIAL Target date: 07/05/2023  2.  Pt will progress with PROM per protocol without adverse effects for improved stiffness and mobility.  Baseline:  Goal status: INITIAL Target date: 07/05/2023  3.  Pt will wean off of crutches without adverse effects  Baseline:  Goal status: INITIAL Target date:  07/05/2023  4.  Pt will progress with exercises per protocol without adverse effects for improved strength and mobility.  Baseline:  Goal status: INITIAL Target date:  07/19/2023   5.  Pt will perform a 6 inch step up with good form and good stability.   Baseline:  Goal  status: INITIAL Target date:  08/02/23   6.  Pt will ambulate with a normalized heel to toe gait pattern without limping.  Baseline:  Goal status: INITIAL Target date: 08/16/23   7.  Pt will have no pain and demo good form with squatting for improved function strength and to assist with functional mobility.  Baseline:  Goal status: INITIAL Target date:  08/23/2023     LONG TERM GOALS: Target date: 09/26/2023   Pt's L hip AROM will be [facility] for improved stiffness and daily mobility  Baseline:  Goal status: INITIAL  2.  Pt will ambulate extended community distance without increased pain and significant difficulty.  Baseline:  Goal status: INITIAL  3.  Pt will be able to perform all of his ADLs/IADLs and functional mobility skills without significant pain and limitation.  Baseline:  Goal status: INITIAL  4.   Pt will be able to ascend and descend stairs with a reciprocal gait with good control without increased L hip pain.  Baseline:  Goal status: INITIAL  5.  Pt will demo 4+/5 strength in L hip abd and ext, 4 to 4+/5 in L hip flex, and 5/5 in L knee ext for improved performance of and tolerance with functional mobility.  Baseline:  Goal status: INITIAL    PLAN:  PT FREQUENCY:  1x/wk x 3-4 weeks and 2x/wk afterwards  PT DURATION: other: 16 weeks  PLANNED INTERVENTIONS: 97164- PT Re-evaluation, 97110-Therapeutic exercises, 97530- Therapeutic activity, 97112- Neuromuscular re-education, 97535- Self Care, 16109- Manual therapy, [phone]- Gait training, [phone]- Aquatic Therapy, [phone]- Electrical stimulation (unattended), [phone]- Electrical stimulation (manual), [id number]- Ultrasound, Patient/Family education, Balance training, Stair training, Taping, Dry Needling, Joint mobilization, Scar mobilization, Cryotherapy, and Moist heat  PLAN FOR NEXT SESSION: Cont per Dr. Serena Croissant labral repair protocol.   Ashley Murrain PT, DPT 06/21/2023 4:17 PM

## 2023-06-21 ENCOUNTER — Encounter (HOSPITAL_BASED_OUTPATIENT_CLINIC_OR_DEPARTMENT_OTHER): Payer: Self-pay | Admitting: Physical Therapy

## 2023-06-21 ENCOUNTER — Ambulatory Visit (HOSPITAL_BASED_OUTPATIENT_CLINIC_OR_DEPARTMENT_OTHER): Payer: Medicare HMO | Attending: Orthopaedic Surgery | Admitting: Physical Therapy

## 2023-06-21 DIAGNOSIS — M25552 Pain in left hip: Secondary | ICD-10-CM | POA: Diagnosis not present

## 2023-06-21 DIAGNOSIS — M6281 Muscle weakness (generalized): Secondary | ICD-10-CM | POA: Insufficient documentation

## 2023-06-21 DIAGNOSIS — M25652 Stiffness of left hip, not elsewhere classified: Secondary | ICD-10-CM | POA: Insufficient documentation

## 2023-06-21 DIAGNOSIS — R262 Difficulty in walking, not elsewhere classified: Secondary | ICD-10-CM | POA: Insufficient documentation

## 2023-06-28 ENCOUNTER — Ambulatory Visit (HOSPITAL_BASED_OUTPATIENT_CLINIC_OR_DEPARTMENT_OTHER): Payer: Medicare HMO | Admitting: Physical Therapy

## 2023-06-28 ENCOUNTER — Encounter (HOSPITAL_BASED_OUTPATIENT_CLINIC_OR_DEPARTMENT_OTHER): Payer: Self-pay | Admitting: Physical Therapy

## 2023-06-28 DIAGNOSIS — M6281 Muscle weakness (generalized): Secondary | ICD-10-CM

## 2023-06-28 DIAGNOSIS — M25652 Stiffness of left hip, not elsewhere classified: Secondary | ICD-10-CM | POA: Diagnosis not present

## 2023-06-28 DIAGNOSIS — R262 Difficulty in walking, not elsewhere classified: Secondary | ICD-10-CM

## 2023-06-28 DIAGNOSIS — M25552 Pain in left hip: Secondary | ICD-10-CM

## 2023-06-28 NOTE — Therapy (Signed)
 OUTPATIENT PHYSICAL THERAPY TREATMENT   Patient Name: Johnny Boyer MRN: [id number] DOB:[birth year], 68 y.o., male Today's Date: 06/28/2023  END OF SESSION:  PT End of Session - 06/28/23 1516     Visit Number 4    Number of Visits 28    Date for PT Re-Evaluation 08/30/23    Authorization Type AET[employer] MCR    Progress Note Due on Visit 10    PT Start Time 1516    PT Stop Time 1600    PT Time Calculation (min) 44 min    Activity Tolerance Patient tolerated treatment well                Past Medical History:  Diagnosis Date   Hemorrhoids    Hyperlipidemia    Myocardial infarction (HCC) 2009   Sleep apnea    no cpap used did not tolerate, moderate osa   Past Surgical History:  Procedure Laterality Date   CARDIAC CATHETERIZATION  2009   stents to mid rca x 2   EVALUATION UNDER ANESTHESIA WITH HEMORRHOIDECTOMY N/A 04/21/2020   Procedure: HEMORRHOIDECTOMY X3 WITH LIGATION AND HEMORRHOIDOPEXY ANORECTAL EXAMINATION UNDER ANESTHESIA;  Surgeon: Karie Soda, MD;  Location: [facility];  Service: General;  Laterality: N/A;   FRACTURE SURGERY  1974   right wrist   SPINE SURGERY  2008 and 2009   cervical C 3 C4 C5 fusion   Patient Active Problem List   Diagnosis Date Noted   History of coronary artery disease 12/15/2019   External hemorrhoids 12/15/2019   Dyslipidemia 12/15/2019     REFERRING PROVIDER: Huel Cote, MD  REFERRING DIAG: [phone] (ICD-10-CM) - Hip impingement syndrome, left s/p Left hip pincer debridement with labral repair and CAM debridement  THERAPY DIAG:  Difficulty in walking, not elsewhere classified  Pain in left hip  Muscle weakness (generalized)  Stiffness of left hip, not elsewhere classified  Rationale for Evaluation and Treatment: Rehabilitation  ONSET DATE: DOS  06/03/2022   SUBJECTIVE:   SUBJECTIVE STATEMENT: 06/28/2023 Pt arrives w/o pain at surgical site, does endorse some continued R hip/back pain. No issues  after last session, states exercises going well at home and has been continuing to walk a lot.    Per eval - Pt reports his L hip pain began last year with no specific MOI.  Pt states his pain progressively worsened.  Pt received PT from September 2024 - January 2025 though had no improvement in pain.  Pt underwent Left hip pincer debridement with labral repair and CAM debridement on 2/18.  Post-op plan note indicated Weight bearing as tolerated and formal physical therapy to begin this week. Pt is limited with his normal functional mobility skills including ambulation and transfers.  He is using his hands to lift leg to get into bed and has difficulty with shower transfers.  Pt has difficulty with dressing and his wife ties his shoes.  Pt unable to run.  Pt is not working since surgery, but plans to work from home on Monday.    PERTINENT HISTORY: Left hip pincer debridement with labral repair and CAM debridement on 2/18 MI 2009 with stent placement Cervical fusion C3-5, 2008 and 2009 Pt states he has bulging discs, L4-5 L knee arthroscopic surgery  PAIN:  NPRS:  0/10 current, 1/10 best, 3/10 worst since the surgery Location:  lateral L hip, at incision  PRECAUTIONS: Other: labral repair protocol, cervical fusion  RED FLAGS: None   WEIGHT BEARING RESTRICTIONS: Yes WBAT  FALLS:  Has patient fallen in last 6 months? No  LIVING ENVIRONMENT: Lives with: lives with their spouse Lives in: 1 story apartment Stairs: no Has following equipment at home: bilat crutches, shower chair  OCCUPATION: pt works for a [employer].  50% on feet/50% sitting.  PLOF: Independent  Pt was running 4 days per week, 3 miles per day.    PATIENT GOALS: get back to running, to be able to perform the normal daily activities including tying shoes, reduce pain.  NEXT MD VISIT: 07/19/23  OBJECTIVE:  Note: Objective measures were completed at Evaluation unless otherwise noted.  DIAGNOSTIC FINDINGS: Pt  had a MRI in July 2024 and is post op currently.   PATIENT SURVEYS:  LEFS 22/80  COGNITION: Overall cognitive status: Within functional limits for tasks assessed      OBSERVATION:  Pt not wearing a brace.  Pt has gauze and tegaderm over portals/incision.  Pt's incision and portals are intact with stiches.  Pt has bruising between portals and incision and has xeroform gauze over incision.  Pt has no signs of infection.   PT removed post op dressings and applied new gauze and tegaderm over portals and incision.  PT educated pt on dressings.      LOWER EXTREMITY ROM:  Not tested  LOWER EXTREMITY MMT:  Strength not tested due to healing constraints and surgical protocol.     GAIT: Assistive device utilized: Crutches Comments: Pt ambulates with bilat crutches with a step through pattern without increased pain.  At times, Pt is picking up his crutches and not using them as much.  He denies any increased pain.                                                                                                                                 TREATMENT DATE:   [facility] Adult PT Treatment:                                                DATE: 06/28/23 Therapeutic Exercise: Stationary bike 5 min during subjective PPT x15 Mini  bridge x15 Hooklying clam x10 Hooklying glute sets x12 Prone positioning 30sec for stretch Prone hamstring curl unweighted x10 Quadruped rocking x12 Prone hip ER/IR small arcs x10 BIL  Quadruped pelvic tilts x12 Tall kneeling unweighted chest press for stability 2x8 cues for posture HEP update    [facility] Adult PT Treatment:                                                DATE: 06/21/23 Therapeutic Exercise: PPT x15 Glute set x15 Quad set x15 Mini bridge x15 HS stretch Quadruped rocking 2x10 cues for  comfortable ROM  Sidelying clams 2x10 LLE Resting prone stretch 2x30sec HEP update + education   PATIENT EDUCATION:  Education details: rationale for  interventions, HEP  Person educated: Patient Education method: Explanation, Demonstration, Tactile cues, Verbal cues Education comprehension: verbalized understanding, returned demonstration, verbal cues required, tactile cues required, and needs further education     HOME EXERCISE PROGRAM: Access Code: [email or web address] URL: [email or web address] Date: 06/28/2023 Prepared by: Fransisco Hertz  Exercises - ANKLE PUMPS  - 3-5 x daily - 7 x weekly - 2-3 sets - 10 reps - Supine Quadricep Sets  - 2 x daily - 7 x weekly - 2 sets - 10 reps - 5 seconds hold - Supine Heel Slide  - 2 x daily - 7 x weekly - 2-3 sets - 10 reps - Supine Posterior Pelvic Tilt  - 2 x daily - 7 x weekly - 2-3 sets - 10 reps - Seated Hamstring Stretch  - 2-3 x daily - 7 x weekly - 2-3 sets - 10 reps - Beginner Bridge  - 3-4 x weekly - 1-2 sets - 8-10 reps - Quadruped Rocking Slow  - 3-4 x weekly - 1-2 sets - 8-10 reps - Quadruped Pelvic Tilt  - 3-4 x weekly - 1-2 sets - 10 reps - Stride Stance Weight Shift  - 3-4 x weekly - 1-2 sets - 10 reps  ASSESSMENT:  CLINICAL IMPRESSION: 06/28/2023 Pt arrives w/o L hip pain, does endorse continued R hip/back pain. Today progressing through surgical protocol with additional quadruped work, prone work, and introduction of tall kneeling which pt does well with. No adverse events, no provocation of L hip pain and R hip pain remains about the same throughout. Recommend continuing along current POC in order to address relevant deficits and improve functional tolerance. Pt departs today's session in no acute distress, all voiced questions/concerns addressed appropriately from PT perspective.     Eval: Patient is a 68 y.o. male 3 days s/p s/p Left hip pincer debridement with labral repair and CAM debridement presenting to the clinic with expected post op limitations including L hip pain, limited hip ROM, muscle weakness, and difficulty in walking.  Pt is limited with his normal  functional mobility skills including ambulation and transfers.  Pt has difficulty with dressing.  Pt is an avid runner and is unable to run due to surgery.  He is not performing his normal work activities currently.  Pt should benefit from skilled PT per protocol to address impairments and improve overall function.      OBJECTIVE IMPAIRMENTS: Abnormal gait, decreased activity tolerance, decreased mobility, difficulty walking, decreased ROM, decreased strength, hypomobility, impaired flexibility, and pain.   ACTIVITY LIMITATIONS: carrying, lifting, bending, standing, squatting, stairs, transfers, bed mobility, dressing, and locomotion level  PARTICIPATION LIMITATIONS: cleaning, community activity, occupation, and running  PERSONAL FACTORS: 1 comorbidity: cervical fusion  are also affecting patient's functional outcome.   REHAB POTENTIAL: Good  CLINICAL DECISION MAKING: Stable/uncomplicated  EVALUATION COMPLEXITY: Low   GOALS:   SHORT TERM GOALS:  Pt will be independent and compliant with HEP for improved pain, ROM, strength, and function.  Baseline: Goal status: INITIAL Target date: 07/05/2023  2.  Pt will progress with PROM per protocol without adverse effects for improved stiffness and mobility.  Baseline:  Goal status: INITIAL Target date: 07/05/2023  3.  Pt will wean off of crutches without adverse effects  Baseline:  Goal status: INITIAL Target date:  07/05/2023  4.  Pt will progress with exercises per protocol  without adverse effects for improved strength and mobility.  Baseline:  Goal status: INITIAL Target date:  07/19/2023   5.  Pt will perform a 6 inch step up with good form and good stability.   Baseline:  Goal status: INITIAL Target date:  08/02/23   6.  Pt will ambulate with a normalized heel to toe gait pattern without limping.  Baseline:  Goal status: INITIAL Target date: 08/16/23   7.  Pt will have no pain and demo good form with squatting for improved  function strength and to assist with functional mobility.  Baseline:  Goal status: INITIAL Target date:  08/23/2023     LONG TERM GOALS: Target date: 09/26/2023   Pt's L hip AROM will be [facility] for improved stiffness and daily mobility  Baseline:  Goal status: INITIAL  2.  Pt will ambulate extended community distance without increased pain and significant difficulty.  Baseline:  Goal status: INITIAL  3.  Pt will be able to perform all of his ADLs/IADLs and functional mobility skills without significant pain and limitation.  Baseline:  Goal status: INITIAL  4.   Pt will be able to ascend and descend stairs with a reciprocal gait with good control without increased L hip pain.  Baseline:  Goal status: INITIAL  5.  Pt will demo 4+/5 strength in L hip abd and ext, 4 to 4+/5 in L hip flex, and 5/5 in L knee ext for improved performance of and tolerance with functional mobility.  Baseline:  Goal status: INITIAL    PLAN:  PT FREQUENCY:  1x/wk x 3-4 weeks and 2x/wk afterwards  PT DURATION: other: 16 weeks  PLANNED INTERVENTIONS: 97164- PT Re-evaluation, 97110-Therapeutic exercises, 97530- Therapeutic activity, 97112- Neuromuscular re-education, 97535- Self Care, 28413- Manual therapy, [phone]- Gait training, [phone]- Aquatic Therapy, [phone]- Electrical stimulation (unattended), [phone]- Electrical stimulation (manual), [id number]- Ultrasound, Patient/Family education, Balance training, Stair training, Taping, Dry Needling, Joint mobilization, Scar mobilization, Cryotherapy, and Moist heat  PLAN FOR NEXT SESSION: Cont per Dr. Serena Croissant labral repair protocol.   Ashley Murrain PT, DPT 06/28/2023 4:10 PM

## 2023-07-02 ENCOUNTER — Encounter (HOSPITAL_BASED_OUTPATIENT_CLINIC_OR_DEPARTMENT_OTHER): Payer: Self-pay | Admitting: Physical Therapy

## 2023-07-02 ENCOUNTER — Ambulatory Visit (HOSPITAL_BASED_OUTPATIENT_CLINIC_OR_DEPARTMENT_OTHER): Payer: Medicare HMO | Admitting: Physical Therapy

## 2023-07-02 DIAGNOSIS — M6281 Muscle weakness (generalized): Secondary | ICD-10-CM | POA: Diagnosis not present

## 2023-07-02 DIAGNOSIS — M25652 Stiffness of left hip, not elsewhere classified: Secondary | ICD-10-CM

## 2023-07-02 DIAGNOSIS — R262 Difficulty in walking, not elsewhere classified: Secondary | ICD-10-CM | POA: Diagnosis not present

## 2023-07-02 DIAGNOSIS — M25552 Pain in left hip: Secondary | ICD-10-CM

## 2023-07-02 NOTE — Therapy (Signed)
 OUTPATIENT PHYSICAL THERAPY TREATMENT   Patient Name: Johnny Boyer MRN: [id number] DOB:1955-09-13, 68 y.o., male Today's Date: 07/02/2023  END OF SESSION:  PT End of Session - 07/02/23 1524     Visit Number 5    Number of Visits 28    Date for PT Re-Evaluation 08/30/23    Authorization Type [employer] MCR    Progress Note Due on Visit 10    PT Start Time 1524   late check in   PT Stop Time 1602    PT Time Calculation (min) 38 min    Activity Tolerance Patient tolerated treatment well                 Past Medical History:  Diagnosis Date   Hemorrhoids    Hyperlipidemia    Myocardial infarction (HCC) 2009   Sleep apnea    no cpap used did not tolerate, moderate osa   Past Surgical History:  Procedure Laterality Date   CARDIAC CATHETERIZATION  2009   stents to mid rca x 2   EVALUATION UNDER ANESTHESIA WITH HEMORRHOIDECTOMY N/A 04/21/2020   Procedure: HEMORRHOIDECTOMY X3 WITH LIGATION AND HEMORRHOIDOPEXY ANORECTAL EXAMINATION UNDER ANESTHESIA;  Surgeon: Karie Soda, MD;  Location: [facility];  Service: General;  Laterality: N/A;   FRACTURE SURGERY  1974   right wrist   SPINE SURGERY  2008 and 2009   cervical C 3 C4 C5 fusion   Patient Active Problem List   Diagnosis Date Noted   History of coronary artery disease 12/15/2019   External hemorrhoids 12/15/2019   Dyslipidemia 12/15/2019     REFERRING PROVIDER: Huel Cote, MD  REFERRING DIAG: [phone] (ICD-10-CM) - Hip impingement syndrome, left s/p Left hip pincer debridement with labral repair and CAM debridement  THERAPY DIAG:  Difficulty in walking, not elsewhere classified  Pain in left hip  Muscle weakness (generalized)  Stiffness of left hip, not elsewhere classified  Rationale for Evaluation and Treatment: Rehabilitation  ONSET DATE: DOS  06/03/2022   SUBJECTIVE:   SUBJECTIVE STATEMENT: 07/02/2023 Pt arrives w/o surgical pain, continues to have some R hip and low back pain.  No issues after last session, doing well with HEP.    Per eval - Pt reports his L hip pain began last year with no specific MOI.  Pt states his pain progressively worsened.  Pt received PT from September 2024 - January 2025 though had no improvement in pain.  Pt underwent Left hip pincer debridement with labral repair and CAM debridement on 2/18.  Post-op plan note indicated Weight bearing as tolerated and formal physical therapy to begin this week. Pt is limited with his normal functional mobility skills including ambulation and transfers.  He is using his hands to lift leg to get into bed and has difficulty with shower transfers.  Pt has difficulty with dressing and his wife ties his shoes.  Pt unable to run.  Pt is not working since surgery, but plans to work from home on Monday.    PERTINENT HISTORY: Left hip pincer debridement with labral repair and CAM debridement on 2/18 MI 2009 with stent placement Cervical fusion C3-5, 2008 and 2009 Pt states he has bulging discs, L4-5 L knee arthroscopic surgery  PAIN:  NPRS:  0/10 current, 1/10 best, 3/10 worst since the surgery Location:  lateral L hip, at incision  PRECAUTIONS: Other: labral repair protocol, cervical fusion  RED FLAGS: None   WEIGHT BEARING RESTRICTIONS: Yes WBAT  FALLS:  Has patient fallen in  last 6 months? No  LIVING ENVIRONMENT: Lives with: lives with their spouse Lives in: 1 story apartment Stairs: no Has following equipment at home: bilat crutches, shower chair  OCCUPATION: pt works for a [employer].  50% on feet/50% sitting.  PLOF: Independent  Pt was running 4 days per week, 3 miles per day.    PATIENT GOALS: get back to running, to be able to perform the normal daily activities including tying shoes, reduce pain.  NEXT MD VISIT: 07/19/23  OBJECTIVE:  Note: Objective measures were completed at Evaluation unless otherwise noted.  DIAGNOSTIC FINDINGS: Pt had a MRI in July 2024 and is post op  currently.   PATIENT SURVEYS:  LEFS 22/80  COGNITION: Overall cognitive status: Within functional limits for tasks assessed      OBSERVATION:  Pt not wearing a brace.  Pt has gauze and tegaderm over portals/incision.  Pt's incision and portals are intact with stiches.  Pt has bruising between portals and incision and has xeroform gauze over incision.  Pt has no signs of infection.   PT removed post op dressings and applied new gauze and tegaderm over portals and incision.  PT educated pt on dressings.      LOWER EXTREMITY ROM:  Not tested  LOWER EXTREMITY MMT:  Strength not tested due to healing constraints and surgical protocol.     GAIT: Assistive device utilized: Crutches Comments: Pt ambulates with bilat crutches with a step through pattern without increased pain.  At times, Pt is picking up his crutches and not using them as much.  He denies any increased pain.                                                                                                                                 TREATMENT DATE:   [facility] Adult PT Treatment:                                                DATE: 07/02/23 Therapeutic Exercise: Recumbent bike Bridge x10; bridge + 3 sec iso hold x10 cues for appropriate ROM Hooklying clam x12 unresisted, red band 2x8 cues for appropriate ROM and form  Quadruped rocking 2x10 Quadruped w/ UE lifts x8 BIL Tallkneeling w/ unweighted chest + OH press x10 Prone hamstring curls unresisted x10 BIL, red band x8 BIL   [facility] Adult PT Treatment:                                                DATE: 06/28/23 Therapeutic Exercise: Stationary bike 5 min during subjective PPT x15 Mini  bridge x15 Hooklying clam x10 Hooklying glute sets x12 Prone positioning 30sec for  stretch Prone hamstring curl unweighted x10 Quadruped rocking x12 Prone hip ER/IR small arcs x10 BIL  Quadruped pelvic tilts x12 Tall kneeling unweighted chest press for stability 2x8 cues  for posture HEP update    [facility] Adult PT Treatment:                                                DATE: 06/21/23 Therapeutic Exercise: PPT x15 Glute set x15 Quad set x15 Mini bridge x15 HS stretch Quadruped rocking 2x10 cues for comfortable ROM  Sidelying clams 2x10 LLE Resting prone stretch 2x30sec HEP update + education   PATIENT EDUCATION:  Education details: rationale for interventions, HEP  Person educated: Patient Education method: Explanation, Demonstration, Tactile cues, Verbal cues Education comprehension: verbalized understanding, returned demonstration, verbal cues required, tactile cues required, and needs further education     HOME EXERCISE PROGRAM: Access Code: [email or web address] URL: [email or web address] Date: 06/28/2023 Prepared by: Fransisco Hertz  Exercises - ANKLE PUMPS  - 3-5 x daily - 7 x weekly - 2-3 sets - 10 reps - Supine Quadricep Sets  - 2 x daily - 7 x weekly - 2 sets - 10 reps - 5 seconds hold - Supine Heel Slide  - 2 x daily - 7 x weekly - 2-3 sets - 10 reps - Supine Posterior Pelvic Tilt  - 2 x daily - 7 x weekly - 2-3 sets - 10 reps - Seated Hamstring Stretch  - 2-3 x daily - 7 x weekly - 2-3 sets - 10 reps - Beginner Bridge  - 3-4 x weekly - 1-2 sets - 8-10 reps - Quadruped Rocking Slow  - 3-4 x weekly - 1-2 sets - 8-10 reps - Quadruped Pelvic Tilt  - 3-4 x weekly - 1-2 sets - 10 reps - Stride Stance Weight Shift  - 3-4 x weekly - 1-2 sets - 10 reps  ASSESSMENT:  CLINICAL IMPRESSION: 07/02/2023 Pt arrives w/o L hip pain, no issues after last session; today is his 4 week post op date. We progress for increased volume with familiar activities which he does well with and introduce light resistance which does not elicit any pain/discomfort aside from hamstring curls, which does not elicit surgical pain but does induce some mild low back discomfort. No adverse events, tolerates activity well and is progressing appropriately along surgical  protocol. No pain on departure. Recommend continuing along current POC in order to address relevant deficits and improve functional tolerance. Pt departs today's session in no acute distress, all voiced questions/concerns addressed appropriately from PT perspective.     Eval: Patient is a 68 y.o. male 3 days s/p s/p Left hip pincer debridement with labral repair and CAM debridement presenting to the clinic with expected post op limitations including L hip pain, limited hip ROM, muscle weakness, and difficulty in walking.  Pt is limited with his normal functional mobility skills including ambulation and transfers.  Pt has difficulty with dressing.  Pt is an avid runner and is unable to run due to surgery.  He is not performing his normal work activities currently.  Pt should benefit from skilled PT per protocol to address impairments and improve overall function.      OBJECTIVE IMPAIRMENTS: Abnormal gait, decreased activity tolerance, decreased mobility, difficulty walking, decreased ROM, decreased strength, hypomobility, impaired flexibility, and pain.   ACTIVITY LIMITATIONS: carrying, lifting, bending, standing, squatting,  stairs, transfers, bed mobility, dressing, and locomotion level  PARTICIPATION LIMITATIONS: cleaning, community activity, occupation, and running  PERSONAL FACTORS: 1 comorbidity: cervical fusion  are also affecting patient's functional outcome.   REHAB POTENTIAL: Good  CLINICAL DECISION MAKING: Stable/uncomplicated  EVALUATION COMPLEXITY: Low   GOALS:   SHORT TERM GOALS:  Pt will be independent and compliant with HEP for improved pain, ROM, strength, and function.  Baseline: Goal status: INITIAL Target date: 07/05/2023  2.  Pt will progress with PROM per protocol without adverse effects for improved stiffness and mobility.  Baseline:  Goal status: INITIAL Target date: 07/05/2023  3.  Pt will wean off of crutches without adverse effects  Baseline:  Goal status:  INITIAL Target date:  07/05/2023  4.  Pt will progress with exercises per protocol without adverse effects for improved strength and mobility.  Baseline:  Goal status: INITIAL Target date:  07/19/2023   5.  Pt will perform a 6 inch step up with good form and good stability.   Baseline:  Goal status: INITIAL Target date:  08/02/23   6.  Pt will ambulate with a normalized heel to toe gait pattern without limping.  Baseline:  Goal status: INITIAL Target date: 08/16/23   7.  Pt will have no pain and demo good form with squatting for improved function strength and to assist with functional mobility.  Baseline:  Goal status: INITIAL Target date:  08/23/2023     LONG TERM GOALS: Target date: 09/26/2023   Pt's L hip AROM will be [facility] for improved stiffness and daily mobility  Baseline:  Goal status: INITIAL  2.  Pt will ambulate extended community distance without increased pain and significant difficulty.  Baseline:  Goal status: INITIAL  3.  Pt will be able to perform all of his ADLs/IADLs and functional mobility skills without significant pain and limitation.  Baseline:  Goal status: INITIAL  4.   Pt will be able to ascend and descend stairs with a reciprocal gait with good control without increased L hip pain.  Baseline:  Goal status: INITIAL  5.  Pt will demo 4+/5 strength in L hip abd and ext, 4 to 4+/5 in L hip flex, and 5/5 in L knee ext for improved performance of and tolerance with functional mobility.  Baseline:  Goal status: INITIAL    PLAN:  PT FREQUENCY:  1x/wk x 3-4 weeks and 2x/wk afterwards  PT DURATION: other: 16 weeks  PLANNED INTERVENTIONS: 97164- PT Re-evaluation, 97110-Therapeutic exercises, 97530- Therapeutic activity, 97112- Neuromuscular re-education, 97535- Self Care, 28413- Manual therapy, [phone]- Gait training, [phone]- Aquatic Therapy, [phone]- Electrical stimulation (unattended), [phone]- Electrical stimulation (manual), [id number]- Ultrasound,  Patient/Family education, Balance training, Stair training, Taping, Dry Needling, Joint mobilization, Scar mobilization, Cryotherapy, and Moist heat  PLAN FOR NEXT SESSION: Cont per Dr. Serena Croissant labral repair protocol.   Ashley Murrain PT, DPT 07/02/2023 4:10 PM

## 2023-07-04 NOTE — Therapy (Signed)
 OUTPATIENT PHYSICAL THERAPY TREATMENT   Patient Name: Johnny Boyer MRN: [id number] DOB:02-09-1956, 68 y.o., male Today's Date: 07/05/2023  END OF SESSION:  PT End of Session - 07/05/23 1514     Visit Number 6    Number of Visits 28    Date for PT Re-Evaluation 08/30/23    Authorization Type [employer] MCR    Progress Note Due on Visit 10    PT Start Time 1515    PT Stop Time 1559    PT Time Calculation (min) 44 min    Activity Tolerance Patient tolerated treatment well                  Past Medical History:  Diagnosis Date   Hemorrhoids    Hyperlipidemia    Myocardial infarction (HCC) 2009   Sleep apnea    no cpap used did not tolerate, moderate osa   Past Surgical History:  Procedure Laterality Date   CARDIAC CATHETERIZATION  2009   stents to mid rca x 2   EVALUATION UNDER ANESTHESIA WITH HEMORRHOIDECTOMY N/A 04/21/2020   Procedure: HEMORRHOIDECTOMY X3 WITH LIGATION AND HEMORRHOIDOPEXY ANORECTAL EXAMINATION UNDER ANESTHESIA;  Surgeon: Karie Soda, MD;  Location: [facility];  Service: General;  Laterality: N/A;   FRACTURE SURGERY  1974   right wrist   SPINE SURGERY  2008 and 2009   cervical C 3 C4 C5 fusion   Patient Active Problem List   Diagnosis Date Noted   History of coronary artery disease 12/15/2019   External hemorrhoids 12/15/2019   Dyslipidemia 12/15/2019     REFERRING PROVIDER: Huel Cote, MD  REFERRING DIAG: [phone] (ICD-10-CM) - Hip impingement syndrome, left s/p Left hip pincer debridement with labral repair and CAM debridement  THERAPY DIAG:  Difficulty in walking, not elsewhere classified  Pain in left hip  Muscle weakness (generalized)  Stiffness of left hip, not elsewhere classified  Rationale for Evaluation and Treatment: Rehabilitation  ONSET DATE: DOS  06/03/2022   SUBJECTIVE:   SUBJECTIVE STATEMENT: 07/05/2023 Pt states he did well after last session. Is a bit sore today and reports increased pain  after trying to jog a bit yesterday. 3/10 in hip currently, reports good HEP adherence.     Per eval - Pt reports his L hip pain began last year with no specific MOI.  Pt states his pain progressively worsened.  Pt received PT from September 2024 - January 2025 though had no improvement in pain.  Pt underwent Left hip pincer debridement with labral repair and CAM debridement on 2/18.  Post-op plan note indicated Weight bearing as tolerated and formal physical therapy to begin this week. Pt is limited with his normal functional mobility skills including ambulation and transfers.  He is using his hands to lift leg to get into bed and has difficulty with shower transfers.  Pt has difficulty with dressing and his wife ties his shoes.  Pt unable to run.  Pt is not working since surgery, but plans to work from home on Monday.    PERTINENT HISTORY: Left hip pincer debridement with labral repair and CAM debridement on 2/18 MI 2009 with stent placement Cervical fusion C3-5, 2008 and 2009 Pt states he has bulging discs, L4-5 L knee arthroscopic surgery  PAIN:  NPRS:  3/10 current, 1/10 best, 3/10 worst since the surgery Location:  lateral L hip, at incision  PRECAUTIONS: Other: labral repair protocol, cervical fusion  RED FLAGS: None   WEIGHT BEARING RESTRICTIONS: Yes WBAT  FALLS:  Has patient fallen in last 6 months? No  LIVING ENVIRONMENT: Lives with: lives with their spouse Lives in: 1 story apartment Stairs: no Has following equipment at home: bilat crutches, shower chair  OCCUPATION: pt works for a [employer].  50% on feet/50% sitting.  PLOF: Independent  Pt was running 4 days per week, 3 miles per day.    PATIENT GOALS: get back to running, to be able to perform the normal daily activities including tying shoes, reduce pain.  NEXT MD VISIT: 07/19/23  OBJECTIVE:  Note: Objective measures were completed at Evaluation unless otherwise noted.  DIAGNOSTIC FINDINGS: Pt had a  MRI in July 2024 and is post op currently.   PATIENT SURVEYS:  LEFS 22/80  COGNITION: Overall cognitive status: Within functional limits for tasks assessed      OBSERVATION:  Pt not wearing a brace.  Pt has gauze and tegaderm over portals/incision.  Pt's incision and portals are intact with stiches.  Pt has bruising between portals and incision and has xeroform gauze over incision.  Pt has no signs of infection.   PT removed post op dressings and applied new gauze and tegaderm over portals and incision.  PT educated pt on dressings.      LOWER EXTREMITY ROM:  Not tested  LOWER EXTREMITY MMT:  Strength not tested due to healing constraints and surgical protocol.     GAIT: Assistive device utilized: Crutches Comments: Pt ambulates with bilat crutches with a step through pattern without increased pain.  At times, Pt is picking up his crutches and not using them as much.  He denies any increased pain.                                                                                                                                 TREATMENT DATE:   [facility] Adult PT Treatment:                                                DATE: 07/05/23 Therapeutic Exercise: Recumbent bike 10 min  Prone hamstring curl x12 unresisted, red band x8 BIL  Bridge w 3 sec iso hold 2x8  Red band row in tall kneeling x10 ; green band x10 Tall kneeling paloff press red band x8 BIL ; green band x8 BIL Quadruped rocking x12 STS from raised surface (1/3) x10 Squat tap to raised surface (1/3) 2x8 HEP update + education/discussion    [facility] Adult PT Treatment:                                                DATE: 07/02/23 Therapeutic Exercise: Recumbent bike Bridge x10; bridge +  3 sec iso hold x10 cues for appropriate ROM Hooklying clam x12 unresisted, red band 2x8 cues for appropriate ROM and form  Quadruped rocking 2x10 Quadruped w/ UE lifts x8 BIL Tallkneeling w/ unweighted chest + OH press x10 Prone  hamstring curls unresisted x10 BIL, red band x8 BIL   [facility] Adult PT Treatment:                                                DATE: 06/28/23 Therapeutic Exercise: Stationary bike 5 min during subjective PPT x15 Mini  bridge x15 Hooklying clam x10 Hooklying glute sets x12 Prone positioning 30sec for stretch Prone hamstring curl unweighted x10 Quadruped rocking x12 Prone hip ER/IR small arcs x10 BIL  Quadruped pelvic tilts x12 Tall kneeling unweighted chest press for stability 2x8 cues for posture HEP update      PATIENT EDUCATION:  Education details: rationale for interventions, HEP  Person educated: Patient Education method: Explanation, Demonstration, Tactile cues, Verbal cues Education comprehension: verbalized understanding, returned demonstration, verbal cues required, tactile cues required, and needs further education     HOME EXERCISE PROGRAM: Access Code: [email or web address] URL: [email or web address] Date: 07/05/2023 Prepared by: Fransisco Hertz  Program Notes -with row, please perform tall kneeling as done in clinic  Exercises - Supine Heel Slide  - 2 x daily - 7 x weekly - 2-3 sets - 10 reps - Supine Posterior Pelvic Tilt  - 2 x daily - 7 x weekly - 2-3 sets - 10 reps - Beginner Bridge  - 3-4 x weekly - 1-2 sets - 8-10 reps - Quadruped Rocking Slow  - 3-4 x weekly - 1-2 sets - 8-10 reps - Quadruped Pelvic Tilt  - 3-4 x weekly - 1-2 sets - 10 reps - Stride Stance Weight Shift  - 3-4 x weekly - 1-2 sets - 10 reps - Tall Kneeling Anti-Rotation Press  - 3-4 x weekly - 2 sets - 8 reps - Standing Shoulder Row with Anchored Resistance  - 1 x daily - 3-4 x weekly - 2 sets - 8 reps  ASSESSMENT:  CLINICAL IMPRESSION: 07/05/2023 Pt arrived w/ 3/10 pain in L hip, states he tried a bit of jogging yesterday but stopped after it began hurting. Had a bit of clicking this morning and woke up with more pain, but has improved as the day has gone on. Encouraged him to avoid  running and educated on gradual progression per protocol, encouraged to closely monitor symptoms and communicate w/ provider as needed. Despite increased pain on arrival he continued to tolerate activity well in session with no increases in pain, able to progress for resistance training in tall kneeling position and introduction of mini STS/squat tap per surgical protocol. Recommend close monitoring of symptoms over next couple sessions and continued education on activity modification per surgical protocol. No adverse events, no increase in pain on departure. Recommend continuing along current POC in order to address relevant deficits and improve functional tolerance. Pt departs today's session in no acute distress, all voiced questions/concerns addressed appropriately from PT perspective.     Eval: Patient is a 68 y.o. male 3 days s/p s/p Left hip pincer debridement with labral repair and CAM debridement presenting to the clinic with expected post op limitations including L hip pain, limited hip ROM, muscle weakness, and difficulty in walking.  Pt is limited with his normal  functional mobility skills including ambulation and transfers.  Pt has difficulty with dressing.  Pt is an avid runner and is unable to run due to surgery.  He is not performing his normal work activities currently.  Pt should benefit from skilled PT per protocol to address impairments and improve overall function.      OBJECTIVE IMPAIRMENTS: Abnormal gait, decreased activity tolerance, decreased mobility, difficulty walking, decreased ROM, decreased strength, hypomobility, impaired flexibility, and pain.   ACTIVITY LIMITATIONS: carrying, lifting, bending, standing, squatting, stairs, transfers, bed mobility, dressing, and locomotion level  PARTICIPATION LIMITATIONS: cleaning, community activity, occupation, and running  PERSONAL FACTORS: 1 comorbidity: cervical fusion  are also affecting patient's functional outcome.   REHAB  POTENTIAL: Good  CLINICAL DECISION MAKING: Stable/uncomplicated  EVALUATION COMPLEXITY: Low   GOALS:   SHORT TERM GOALS:  Pt will be independent and compliant with HEP for improved pain, ROM, strength, and function.  Baseline: Goal status: INITIAL Target date: 07/05/2023  2.  Pt will progress with PROM per protocol without adverse effects for improved stiffness and mobility.  Baseline:  Goal status: INITIAL Target date: 07/05/2023  3.  Pt will wean off of crutches without adverse effects  Baseline:  Goal status: INITIAL Target date:  07/05/2023  4.  Pt will progress with exercises per protocol without adverse effects for improved strength and mobility.  Baseline:  Goal status: INITIAL Target date:  07/19/2023   5.  Pt will perform a 6 inch step up with good form and good stability.   Baseline:  Goal status: INITIAL Target date:  08/02/23   6.  Pt will ambulate with a normalized heel to toe gait pattern without limping.  Baseline:  Goal status: INITIAL Target date: 08/16/23   7.  Pt will have no pain and demo good form with squatting for improved function strength and to assist with functional mobility.  Baseline:  Goal status: INITIAL Target date:  08/23/2023     LONG TERM GOALS: Target date: 09/26/2023   Pt's L hip AROM will be [facility] for improved stiffness and daily mobility  Baseline:  Goal status: INITIAL  2.  Pt will ambulate extended community distance without increased pain and significant difficulty.  Baseline:  Goal status: INITIAL  3.  Pt will be able to perform all of his ADLs/IADLs and functional mobility skills without significant pain and limitation.  Baseline:  Goal status: INITIAL  4.   Pt will be able to ascend and descend stairs with a reciprocal gait with good control without increased L hip pain.  Baseline:  Goal status: INITIAL  5.  Pt will demo 4+/5 strength in L hip abd and ext, 4 to 4+/5 in L hip flex, and 5/5 in L knee ext for improved  performance of and tolerance with functional mobility.  Baseline:  Goal status: INITIAL    PLAN:  PT FREQUENCY:  1x/wk x 3-4 weeks and 2x/wk afterwards  PT DURATION: other: 16 weeks  PLANNED INTERVENTIONS: 97164- PT Re-evaluation, 97110-Therapeutic exercises, 97530- Therapeutic activity, 97112- Neuromuscular re-education, 97535- Self Care, [id number]- Manual therapy, [phone]- Gait training, [phone]- Aquatic Therapy, [phone]- Electrical stimulation (unattended), [phone]- Electrical stimulation (manual), [id number]- Ultrasound, Patient/Family education, Balance training, Stair training, Taping, Dry Needling, Joint mobilization, Scar mobilization, Cryotherapy, and Moist heat  PLAN FOR NEXT SESSION: Cont per Dr. Serena Croissant labral repair protocol.   Ashley Murrain PT, DPT 07/05/2023 4:11 PM

## 2023-07-05 ENCOUNTER — Ambulatory Visit (HOSPITAL_BASED_OUTPATIENT_CLINIC_OR_DEPARTMENT_OTHER): Payer: Medicare HMO | Admitting: Physical Therapy

## 2023-07-05 ENCOUNTER — Encounter (HOSPITAL_BASED_OUTPATIENT_CLINIC_OR_DEPARTMENT_OTHER): Payer: Self-pay | Admitting: Physical Therapy

## 2023-07-05 DIAGNOSIS — R262 Difficulty in walking, not elsewhere classified: Secondary | ICD-10-CM | POA: Diagnosis not present

## 2023-07-05 DIAGNOSIS — M25652 Stiffness of left hip, not elsewhere classified: Secondary | ICD-10-CM | POA: Diagnosis not present

## 2023-07-05 DIAGNOSIS — M25552 Pain in left hip: Secondary | ICD-10-CM

## 2023-07-05 DIAGNOSIS — M6281 Muscle weakness (generalized): Secondary | ICD-10-CM

## 2023-07-09 ENCOUNTER — Ambulatory Visit (HOSPITAL_BASED_OUTPATIENT_CLINIC_OR_DEPARTMENT_OTHER): Payer: Medicare HMO

## 2023-07-09 ENCOUNTER — Encounter (HOSPITAL_BASED_OUTPATIENT_CLINIC_OR_DEPARTMENT_OTHER): Payer: Self-pay

## 2023-07-09 DIAGNOSIS — M25552 Pain in left hip: Secondary | ICD-10-CM

## 2023-07-09 DIAGNOSIS — R262 Difficulty in walking, not elsewhere classified: Secondary | ICD-10-CM

## 2023-07-09 DIAGNOSIS — M25652 Stiffness of left hip, not elsewhere classified: Secondary | ICD-10-CM | POA: Diagnosis not present

## 2023-07-09 DIAGNOSIS — M6281 Muscle weakness (generalized): Secondary | ICD-10-CM | POA: Diagnosis not present

## 2023-07-09 NOTE — Therapy (Signed)
 OUTPATIENT PHYSICAL THERAPY TREATMENT   Patient Name: Johnny Boyer MRN: [id number] DOB:01-Jun-1955, 68 y.o., male Today's Date: 07/09/2023  END OF SESSION:  PT End of Session - 07/09/23 1520     Visit Number 7    Number of Visits 28    Date for PT Re-Evaluation 08/30/23    Authorization Type [employer] MCR    Progress Note Due on Visit 10    PT Start Time 1517    PT Stop Time 1600    PT Time Calculation (min) 43 min    Activity Tolerance Patient tolerated treatment well    Behavior During Therapy WFL for tasks assessed/performed                   Past Medical History:  Diagnosis Date   Hemorrhoids    Hyperlipidemia    Myocardial infarction (HCC) 2009   Sleep apnea    no cpap used did not tolerate, moderate osa   Past Surgical History:  Procedure Laterality Date   CARDIAC CATHETERIZATION  2009   stents to mid rca x 2   EVALUATION UNDER ANESTHESIA WITH HEMORRHOIDECTOMY N/A 04/21/2020   Procedure: HEMORRHOIDECTOMY X3 WITH LIGATION AND HEMORRHOIDOPEXY ANORECTAL EXAMINATION UNDER ANESTHESIA;  Surgeon: Karie Soda, MD;  Location: [facility];  Service: General;  Laterality: N/A;   FRACTURE SURGERY  1974   right wrist   SPINE SURGERY  2008 and 2009   cervical C 3 C4 C5 fusion   Patient Active Problem List   Diagnosis Date Noted   History of coronary artery disease 12/15/2019   External hemorrhoids 12/15/2019   Dyslipidemia 12/15/2019     REFERRING PROVIDER: Huel Cote, MD  REFERRING DIAG: [phone] (ICD-10-CM) - Hip impingement syndrome, left s/p Left hip pincer debridement with labral repair and CAM debridement  THERAPY DIAG:  Difficulty in walking, not elsewhere classified  Pain in left hip  Muscle weakness (generalized)  Stiffness of left hip, not elsewhere classified  Rationale for Evaluation and Treatment: Rehabilitation  ONSET DATE: DOS  06/03/2022   SUBJECTIVE:   SUBJECTIVE STATEMENT: 07/09/2023 Pt states he walked 2 miles  last night at a fast pace which caused only mild soreness. No pain unless he moves a certain way. Increased stiffness in hip after standing on concrete floor at work today.     Per eval - Pt reports his L hip pain began last year with no specific MOI.  Pt states his pain progressively worsened.  Pt received PT from September 2024 - January 2025 though had no improvement in pain.  Pt underwent Left hip pincer debridement with labral repair and CAM debridement on 2/18.  Post-op plan note indicated Weight bearing as tolerated and formal physical therapy to begin this week. Pt is limited with his normal functional mobility skills including ambulation and transfers.  He is using his hands to lift leg to get into bed and has difficulty with shower transfers.  Pt has difficulty with dressing and his wife ties his shoes.  Pt unable to run.  Pt is not working since surgery, but plans to work from home on Monday.    PERTINENT HISTORY: Left hip pincer debridement with labral repair and CAM debridement on 2/18 MI 2009 with stent placement Cervical fusion C3-5, 2008 and 2009 Pt states he has bulging discs, L4-5 L knee arthroscopic surgery  PAIN:  NPRS:  3/10 current, 1/10 best, 3/10 worst since the surgery Location:  lateral L hip, at incision  PRECAUTIONS: Other: labral  repair protocol, cervical fusion  RED FLAGS: None   WEIGHT BEARING RESTRICTIONS: Yes WBAT  FALLS:  Has patient fallen in last 6 months? No  LIVING ENVIRONMENT: Lives with: lives with their spouse Lives in: 1 story apartment Stairs: no Has following equipment at home: bilat crutches, shower chair  OCCUPATION: pt works for a [employer].  50% on feet/50% sitting.  PLOF: Independent  Pt was running 4 days per week, 3 miles per day.    PATIENT GOALS: get back to running, to be able to perform the normal daily activities including tying shoes, reduce pain.  NEXT MD VISIT: 07/19/23  OBJECTIVE:  Note: Objective measures  were completed at Evaluation unless otherwise noted.  DIAGNOSTIC FINDINGS: Pt had a MRI in July 2024 and is post op currently.   PATIENT SURVEYS:  LEFS 22/80  COGNITION: Overall cognitive status: Within functional limits for tasks assessed      OBSERVATION:  Pt not wearing a brace.  Pt has gauze and tegaderm over portals/incision.  Pt's incision and portals are intact with stiches.  Pt has bruising between portals and incision and has xeroform gauze over incision.  Pt has no signs of infection.   PT removed post op dressings and applied new gauze and tegaderm over portals and incision.  PT educated pt on dressings.      LOWER EXTREMITY ROM:  Not tested  LOWER EXTREMITY MMT:  Strength not tested due to healing constraints and surgical protocol.     GAIT: Assistive device utilized: Crutches Comments: Pt ambulates with bilat crutches with a step through pattern without increased pain.  At times, Pt is picking up his crutches and not using them as much.  He denies any increased pain.                                                                                                                                 TREATMENT DATE:    [facility] Adult PT Treatment:                                                DATE: 07/09/23 Therapeutic Exercise: Upright bike L5 x75min L hip PROM  Prone hamstring curl x12 unresisted, red band x8 BIL  Bridge w 3 sec iso hold 2x8  Row in tall kneeling; green band x30 Tall kneeling paloff press  green band x30 BIL Quadruped rocking x20 ea  Squat tap to raised surface (1/3) 2x10 SLS trials  Standing hip abduction 2.5# cuff weights 3x10ea     [facility] Adult PT Treatment:  DATE: 07/05/23 Therapeutic Exercise: Recumbent bike 10 min  Prone hamstring curl x12 unresisted, red band x8 BIL  Bridge w 3 sec iso hold 2x8  Red band row in tall kneeling x10 ; green band x10 Tall kneeling paloff press red band x8 BIL ;  green band x8 BIL Quadruped rocking x12 STS from raised surface (1/3) x10 Squat tap to raised surface (1/3) 2x8 HEP update + education/discussion    [facility] Adult PT Treatment:                                                DATE: 07/02/23 Therapeutic Exercise: Recumbent bike Bridge x10; bridge + 3 sec iso hold x10 cues for appropriate ROM Hooklying clam x12 unresisted, red band 2x8 cues for appropriate ROM and form  Quadruped rocking 2x10 Quadruped w/ UE lifts x8 BIL Tallkneeling w/ unweighted chest + OH press x10 Prone hamstring curls unresisted x10 BIL, red band x8 BIL   [facility] Adult PT Treatment:                                                DATE: 06/28/23 Therapeutic Exercise: Stationary bike 5 min during subjective PPT x15 Mini  bridge x15 Hooklying clam x10 Hooklying glute sets x12 Prone positioning 30sec for stretch Prone hamstring curl unweighted x10 Quadruped rocking x12 Prone hip ER/IR small arcs x10 BIL  Quadruped pelvic tilts x12 Tall kneeling unweighted chest press for stability 2x8 cues for posture HEP update      PATIENT EDUCATION:  Education details: rationale for interventions, HEP  Person educated: Patient Education method: Explanation, Demonstration, Tactile cues, Verbal cues Education comprehension: verbalized understanding, returned demonstration, verbal cues required, tactile cues required, and needs further education     HOME EXERCISE PROGRAM: Access Code: [email or web address] URL: [email or web address] Date: 07/05/2023 Prepared by: Fransisco Hertz  Program Notes -with row, please perform tall kneeling as done in clinic  Exercises - Supine Heel Slide  - 2 x daily - 7 x weekly - 2-3 sets - 10 reps - Supine Posterior Pelvic Tilt  - 2 x daily - 7 x weekly - 2-3 sets - 10 reps - Beginner Bridge  - 3-4 x weekly - 1-2 sets - 8-10 reps - Quadruped Rocking Slow  - 3-4 x weekly - 1-2 sets - 8-10 reps - Quadruped Pelvic Tilt  - 3-4 x weekly - 1-2  sets - 10 reps - Stride Stance Weight Shift  - 3-4 x weekly - 1-2 sets - 10 reps - Tall Kneeling Anti-Rotation Press  - 3-4 x weekly - 2 sets - 8 reps - Standing Shoulder Row with Anchored Resistance  - 1 x daily - 3-4 x weekly - 2 sets - 8 reps  ASSESSMENT:  CLINICAL IMPRESSION: Pt with overall good hip mobility, though limited into IR. He denied pain with all exercises today. Some fatigue reported with strengthening tasks. Reviewed protocol and healing timeline with pt with good understanding. Pt is 5 weeks s/p today.   Eval: Patient is a 68 y.o. male 3 days s/p s/p Left hip pincer debridement with labral repair and CAM debridement presenting to the clinic with expected post op limitations including L hip pain, limited hip ROM, muscle weakness, and  difficulty in walking.  Pt is limited with his normal functional mobility skills including ambulation and transfers.  Pt has difficulty with dressing.  Pt is an avid runner and is unable to run due to surgery.  He is not performing his normal work activities currently.  Pt should benefit from skilled PT per protocol to address impairments and improve overall function.      OBJECTIVE IMPAIRMENTS: Abnormal gait, decreased activity tolerance, decreased mobility, difficulty walking, decreased ROM, decreased strength, hypomobility, impaired flexibility, and pain.   ACTIVITY LIMITATIONS: carrying, lifting, bending, standing, squatting, stairs, transfers, bed mobility, dressing, and locomotion level  PARTICIPATION LIMITATIONS: cleaning, community activity, occupation, and running  PERSONAL FACTORS: 1 comorbidity: cervical fusion  are also affecting patient's functional outcome.   REHAB POTENTIAL: Good  CLINICAL DECISION MAKING: Stable/uncomplicated  EVALUATION COMPLEXITY: Low   GOALS:   SHORT TERM GOALS:  Pt will be independent and compliant with HEP for improved pain, ROM, strength, and function.  Baseline: Goal status: INITIAL Target date:  07/05/2023  2.  Pt will progress with PROM per protocol without adverse effects for improved stiffness and mobility.  Baseline:  Goal status: INITIAL Target date: 07/05/2023  3.  Pt will wean off of crutches without adverse effects  Baseline:  Goal status: INITIAL Target date:  07/05/2023  4.  Pt will progress with exercises per protocol without adverse effects for improved strength and mobility.  Baseline:  Goal status: INITIAL Target date:  07/19/2023   5.  Pt will perform a 6 inch step up with good form and good stability.   Baseline:  Goal status: INITIAL Target date:  08/02/23   6.  Pt will ambulate with a normalized heel to toe gait pattern without limping.  Baseline:  Goal status: INITIAL Target date: 08/16/23   7.  Pt will have no pain and demo good form with squatting for improved function strength and to assist with functional mobility.  Baseline:  Goal status: INITIAL Target date:  08/23/2023     LONG TERM GOALS: Target date: 09/26/2023   Pt's L hip AROM will be [facility] for improved stiffness and daily mobility  Baseline:  Goal status: INITIAL  2.  Pt will ambulate extended community distance without increased pain and significant difficulty.  Baseline:  Goal status: INITIAL  3.  Pt will be able to perform all of his ADLs/IADLs and functional mobility skills without significant pain and limitation.  Baseline:  Goal status: INITIAL  4.   Pt will be able to ascend and descend stairs with a reciprocal gait with good control without increased L hip pain.  Baseline:  Goal status: INITIAL  5.  Pt will demo 4+/5 strength in L hip abd and ext, 4 to 4+/5 in L hip flex, and 5/5 in L knee ext for improved performance of and tolerance with functional mobility.  Baseline:  Goal status: INITIAL    PLAN:  PT FREQUENCY:  1x/wk x 3-4 weeks and 2x/wk afterwards  PT DURATION: other: 16 weeks  PLANNED INTERVENTIONS: 97164- PT Re-evaluation, 97110-Therapeutic exercises,  97530- Therapeutic activity, 97112- Neuromuscular re-education, 97535- Self Care, 21308- Manual therapy, [phone]- Gait training, [phone]- Aquatic Therapy, [phone]- Electrical stimulation (unattended), [phone]- Electrical stimulation (manual), [id number]- Ultrasound, Patient/Family education, Balance training, Stair training, Taping, Dry Needling, Joint mobilization, Scar mobilization, Cryotherapy, and Moist heat  PLAN FOR NEXT SESSION: Cont per Dr. Serena Croissant labral repair protocol.   Riki Altes, PTA  07/09/2023 5:14 PM

## 2023-07-10 ENCOUNTER — Encounter (HOSPITAL_BASED_OUTPATIENT_CLINIC_OR_DEPARTMENT_OTHER): Payer: Self-pay | Admitting: Orthopaedic Surgery

## 2023-07-10 ENCOUNTER — Telehealth (HOSPITAL_COMMUNITY): Payer: Self-pay | Admitting: Cardiology

## 2023-07-10 NOTE — Telephone Encounter (Signed)
 Patient called and cancelled Myoview for reason below:   07/10/2023 10:38 AM By: Lance Sell E  Cancel Rsn: Patient (Pt will call back to r/s)   Order will be removed from the echo WQ.

## 2023-07-12 ENCOUNTER — Ambulatory Visit (HOSPITAL_BASED_OUTPATIENT_CLINIC_OR_DEPARTMENT_OTHER): Payer: Medicare HMO | Admitting: Physical Therapy

## 2023-07-12 ENCOUNTER — Encounter (HOSPITAL_BASED_OUTPATIENT_CLINIC_OR_DEPARTMENT_OTHER): Payer: Self-pay | Admitting: Physical Therapy

## 2023-07-12 DIAGNOSIS — M25552 Pain in left hip: Secondary | ICD-10-CM | POA: Diagnosis not present

## 2023-07-12 DIAGNOSIS — M25652 Stiffness of left hip, not elsewhere classified: Secondary | ICD-10-CM

## 2023-07-12 DIAGNOSIS — M6281 Muscle weakness (generalized): Secondary | ICD-10-CM

## 2023-07-12 DIAGNOSIS — R262 Difficulty in walking, not elsewhere classified: Secondary | ICD-10-CM | POA: Diagnosis not present

## 2023-07-12 NOTE — Therapy (Signed)
 OUTPATIENT PHYSICAL THERAPY TREATMENT   Patient Name: Johnny Boyer MRN: [id number] DOB:1956/03/04, 68 y.o., male Today's Date: 07/12/2023  END OF SESSION:  PT End of Session - 07/12/23 1514     Visit Number 8    Number of Visits 28    Date for PT Re-Evaluation 08/30/23    Authorization Type [employer]ET[employer] MCR    Progress Note Due on Visit 10    PT Start Time 1515    PT Stop Time 1601    PT Time Calculation (min) 46 min    Activity Tolerance Patient tolerated treatment well                    Past Medical History:  Diagnosis Date   Hemorrhoids    Hyperlipidemia    Myocardial infarction (HCC) 2009   Sleep apnea    no cpap used did not tolerate, moderate osa   Past Surgical History:  Procedure Laterality Date   CARDIAC CATHETERIZATION  2009   stents to mid rca x 2   EVALUATION UNDER ANESTHESIA WITH HEMORRHOIDECTOMY N/A 04/21/2020   Procedure: HEMORRHOIDECTOMY X3 WITH LIGATION AND HEMORRHOIDOPEXY ANORECTAL EXAMINATION UNDER ANESTHESIA;  Surgeon: Karie Soda, MD;  Location: [facility];  Service: General;  Laterality: N/A;   FRACTURE SURGERY  1974   right wrist   SPINE SURGERY  2008 and 2009   cervical C 3 C4 C5 fusion   Patient Active Problem List   Diagnosis Date Noted   History of coronary artery disease 12/15/2019   External hemorrhoids 12/15/2019   Dyslipidemia 12/15/2019     REFERRING PROVIDER: Huel Cote, MD  REFERRING DIAG: [phone] (ICD-10-CM) - Hip impingement syndrome, left s/p Left hip pincer debridement with labral repair and CAM debridement  THERAPY DIAG:  Difficulty in walking, not elsewhere classified  Pain in left hip  Muscle weakness (generalized)  Stiffness of left hip, not elsewhere classified  Rationale for Evaluation and Treatment: Rehabilitation  ONSET DATE: DOS  06/03/2022   SUBJECTIVE:   SUBJECTIVE STATEMENT: 07/12/2023 Pt states he is feeling okay today. No issues after last session. Good tolerance with  HEP and has been walking. No other new updates     Per eval - Pt reports his L hip pain began last year with no specific MOI.  Pt states his pain progressively worsened.  Pt received PT from September 2024 - January 2025 though had no improvement in pain.  Pt underwent Left hip pincer debridement with labral repair and CAM debridement on 2/18.  Post-op plan note indicated Weight bearing as tolerated and formal physical therapy to begin this week. Pt is limited with his normal functional mobility skills including ambulation and transfers.  He is using his hands to lift leg to get into bed and has difficulty with shower transfers.  Pt has difficulty with dressing and his wife ties his shoes.  Pt unable to run.  Pt is not working since surgery, but plans to work from home on Monday.    PERTINENT HISTORY: Left hip pincer debridement with labral repair and CAM debridement on 2/18 MI 2009 with stent placement Cervical fusion C3-5, 2008 and 2009 Pt states he has bulging discs, L4-5 L knee arthroscopic surgery  PAIN:  NPRS:  3/10 current, 1/10 best, 3/10 worst since the surgery Location:  lateral L hip, at incision  PRECAUTIONS: Other: labral repair protocol, cervical fusion  RED FLAGS: None   WEIGHT BEARING RESTRICTIONS: Yes WBAT  FALLS:  Has patient fallen in  last 6 months? No  LIVING ENVIRONMENT: Lives with: lives with their spouse Lives in: 1 story apartment Stairs: no Has following equipment at home: bilat crutches, shower chair  OCCUPATION: pt works for a [employer].  50% on feet/50% sitting.  PLOF: Independent  Pt was running 4 days per week, 3 miles per day.    PATIENT GOALS: get back to running, to be able to perform the normal daily activities including tying shoes, reduce pain.  NEXT MD VISIT: 07/19/23  OBJECTIVE:  Note: Objective measures were completed at Evaluation unless otherwise noted.  DIAGNOSTIC FINDINGS: Pt had a MRI in July 2024 and is post op  currently.   PATIENT SURVEYS:  LEFS 22/80  COGNITION: Overall cognitive status: Within functional limits for tasks assessed      OBSERVATION:  Pt not wearing a brace.  Pt has gauze and tegaderm over portals/incision.  Pt's incision and portals are intact with stiches.  Pt has bruising between portals and incision and has xeroform gauze over incision.  Pt has no signs of infection.   PT removed post op dressings and applied new gauze and tegaderm over portals and incision.  PT educated pt on dressings.      LOWER EXTREMITY ROM:  Not tested  LOWER EXTREMITY MMT:  Strength not tested due to healing constraints and surgical protocol.     GAIT: Assistive device utilized: Crutches Comments: Pt ambulates with bilat crutches with a step through pattern without increased pain.  At times, Pt is picking up his crutches and not using them as much.  He denies any increased pain.                                                                                                                                 TREATMENT DATE:   [facility] Adult PT Treatment:                                                DATE: 07/12/23 Therapeutic Exercise: Recumbent bike 15 min  Prone hip ext 2x8 BIL cues for reduced truncal compensations  Tall kneeling blue band row 2x12 Tall kneeling blue band paloff x12 BIL HEP discussion/education  Therapeutic Activity: Squat tap 1/3 to raised mat 3x12 SLS weaning UE support 3x45sec     [facility] Adult PT Treatment:                                                DATE: 07/09/23 Therapeutic Exercise: Upright bike L5 x68min L hip PROM  Prone hamstring curl x12 unresisted, red band x8 BIL  Bridge w 3 sec iso hold 2x8  Row in tall kneeling; green band  x30 Tall kneeling paloff press  green band x30 BIL Quadruped rocking x20 ea  Squat tap to raised surface (1/3) 2x10 SLS trials  Standing hip abduction 2.5# cuff weights 3x10ea     [facility] Adult PT Treatment:                                                 DATE: 07/05/23 Therapeutic Exercise: Recumbent bike 10 min  Prone hamstring curl x12 unresisted, red band x8 BIL  Bridge w 3 sec iso hold 2x8  Red band row in tall kneeling x10 ; green band x10 Tall kneeling paloff press red band x8 BIL ; green band x8 BIL Quadruped rocking x12 STS from raised surface (1/3) x10 Squat tap to raised surface (1/3) 2x8 HEP update + education/discussion    [facility] Adult PT Treatment:                                                DATE: 07/02/23 Therapeutic Exercise: Recumbent bike Bridge x10; bridge + 3 sec iso hold x10 cues for appropriate ROM Hooklying clam x12 unresisted, red band 2x8 cues for appropriate ROM and form  Quadruped rocking 2x10 Quadruped w/ UE lifts x8 BIL Tallkneeling w/ unweighted chest + OH press x10 Prone hamstring curls unresisted x10 BIL, red band x8 BIL   [facility] Adult PT Treatment:                                                DATE: 06/28/23 Therapeutic Exercise: Stationary bike 5 min during subjective PPT x15 Mini  bridge x15 Hooklying clam x10 Hooklying glute sets x12 Prone positioning 30sec for stretch Prone hamstring curl unweighted x10 Quadruped rocking x12 Prone hip ER/IR small arcs x10 BIL  Quadruped pelvic tilts x12 Tall kneeling unweighted chest press for stability 2x8 cues for posture HEP update      PATIENT EDUCATION:  Education details: rationale for interventions, HEP  Person educated: Patient Education method: Explanation, Demonstration, Tactile cues, Verbal cues Education comprehension: verbalized understanding, returned demonstration, verbal cues required, tactile cues required, and needs further education     HOME EXERCISE PROGRAM: Access Code: [email or web address] URL: [email or web address] Date: 07/12/2023 Prepared by: Fransisco Hertz  Program Notes -with row, please perform tall kneeling as done in clinic  Exercises - Supine Heel Slide  - 2 x daily - 7 x  weekly - 2-3 sets - 10 reps - Supine Posterior Pelvic Tilt  - 2 x daily - 7 x weekly - 2-3 sets - 10 reps - Beginner Bridge  - 3-4 x weekly - 1-2 sets - 8-10 reps - Quadruped Rocking Slow  - 3-4 x weekly - 1-2 sets - 8-10 reps - Quadruped Pelvic Tilt  - 3-4 x weekly - 1-2 sets - 10 reps - Stride Stance Weight Shift  - 3-4 x weekly - 1-2 sets - 10 reps - Tall Kneeling Anti-Rotation Press  - 3-4 x weekly - 2 sets - 8 reps - Standing Shoulder Row with Anchored Resistance  - 1 x daily - 3-4 x weekly - 2 sets - 8  reps - Squat with Chair Touch  - 1-2 x daily - 7 x weekly - 2-3 sets - 10 reps - Sidelying Hip Abduction  - 1-2 x daily - 7 x weekly - 3 sets - 10 reps - Prone Hip Extension  - 1-2 x daily - 7 x weekly - 2 sets - 8 reps  ASSESSMENT:  CLINICAL IMPRESSION: 07/12/2023 Pt arrives w/o pain, no issues after last session. Continues to progress well per surgical protocol with progression for increased volume on bike and increased resistance with tall kneeling exercises. Also increasing volume with mini squat taps and SLS, emphasizing appropriate mechanics and reduced truncal/pelvic compensations.No adverse events, tolerates session well denying any pain. Recommend continuing along current POC in order to address relevant deficits and improve functional tolerance. Pt departs today's session in no acute distress, all voiced questions/concerns addressed appropriately from PT perspective.     Eval: Patient is a 68 y.o. male 3 days s/p s/p Left hip pincer debridement with labral repair and CAM debridement presenting to the clinic with expected post op limitations including L hip pain, limited hip ROM, muscle weakness, and difficulty in walking.  Pt is limited with his normal functional mobility skills including ambulation and transfers.  Pt has difficulty with dressing.  Pt is an avid runner and is unable to run due to surgery.  He is not performing his normal work activities currently.  Pt should benefit  from skilled PT per protocol to address impairments and improve overall function.      OBJECTIVE IMPAIRMENTS: Abnormal gait, decreased activity tolerance, decreased mobility, difficulty walking, decreased ROM, decreased strength, hypomobility, impaired flexibility, and pain.   ACTIVITY LIMITATIONS: carrying, lifting, bending, standing, squatting, stairs, transfers, bed mobility, dressing, and locomotion level  PARTICIPATION LIMITATIONS: cleaning, community activity, occupation, and running  PERSONAL FACTORS: 1 comorbidity: cervical fusion  are also affecting patient's functional outcome.   REHAB POTENTIAL: Good  CLINICAL DECISION MAKING: Stable/uncomplicated  EVALUATION COMPLEXITY: Low   GOALS:   SHORT TERM GOALS:  Pt will be independent and compliant with HEP for improved pain, ROM, strength, and function.  Baseline: Goal status: INITIAL Target date: 07/05/2023  2.  Pt will progress with PROM per protocol without adverse effects for improved stiffness and mobility.  Baseline:  Goal status: INITIAL Target date: 07/05/2023  3.  Pt will wean off of crutches without adverse effects  Baseline:  Goal status: INITIAL Target date:  07/05/2023  4.  Pt will progress with exercises per protocol without adverse effects for improved strength and mobility.  Baseline:  Goal status: INITIAL Target date:  07/19/2023   5.  Pt will perform a 6 inch step up with good form and good stability.   Baseline:  Goal status: INITIAL Target date:  08/02/23   6.  Pt will ambulate with a normalized heel to toe gait pattern without limping.  Baseline:  Goal status: INITIAL Target date: 08/16/23   7.  Pt will have no pain and demo good form with squatting for improved function strength and to assist with functional mobility.  Baseline:  Goal status: INITIAL Target date:  08/23/2023     LONG TERM GOALS: Target date: 09/26/2023   Pt's L hip AROM will be [facility] for improved stiffness and daily  mobility  Baseline:  Goal status: INITIAL  2.  Pt will ambulate extended community distance without increased pain and significant difficulty.  Baseline:  Goal status: INITIAL  3.  Pt will be able to perform all of  his ADLs/IADLs and functional mobility skills without significant pain and limitation.  Baseline:  Goal status: INITIAL  4.   Pt will be able to ascend and descend stairs with a reciprocal gait with good control without increased L hip pain.  Baseline:  Goal status: INITIAL  5.  Pt will demo 4+/5 strength in L hip abd and ext, 4 to 4+/5 in L hip flex, and 5/5 in L knee ext for improved performance of and tolerance with functional mobility.  Baseline:  Goal status: INITIAL    PLAN:  PT FREQUENCY:  1x/wk x 3-4 weeks and 2x/wk afterwards  PT DURATION: other: 16 weeks  PLANNED INTERVENTIONS: 97164- PT Re-evaluation, 97110-Therapeutic exercises, 97530- Therapeutic activity, 97112- Neuromuscular re-education, 97535- Self Care, 16109- Manual therapy, [phone]- Gait training, [phone]- Aquatic Therapy, [phone]- Electrical stimulation (unattended), [phone]- Electrical stimulation (manual), [id number]- Ultrasound, Patient/Family education, Balance training, Stair training, Taping, Dry Needling, Joint mobilization, Scar mobilization, Cryotherapy, and Moist heat  PLAN FOR NEXT SESSION: Cont per Dr. Serena Croissant labral repair protocol.    Ashley Murrain PT, DPT 07/12/2023 4:05 PM

## 2023-07-18 ENCOUNTER — Encounter (HOSPITAL_BASED_OUTPATIENT_CLINIC_OR_DEPARTMENT_OTHER): Payer: Self-pay | Admitting: Physical Therapy

## 2023-07-18 ENCOUNTER — Ambulatory Visit (HOSPITAL_BASED_OUTPATIENT_CLINIC_OR_DEPARTMENT_OTHER): Attending: Orthopaedic Surgery | Admitting: Physical Therapy

## 2023-07-18 DIAGNOSIS — M25552 Pain in left hip: Secondary | ICD-10-CM | POA: Insufficient documentation

## 2023-07-18 DIAGNOSIS — M25652 Stiffness of left hip, not elsewhere classified: Secondary | ICD-10-CM | POA: Insufficient documentation

## 2023-07-18 DIAGNOSIS — M6281 Muscle weakness (generalized): Secondary | ICD-10-CM | POA: Diagnosis not present

## 2023-07-18 DIAGNOSIS — R262 Difficulty in walking, not elsewhere classified: Secondary | ICD-10-CM | POA: Diagnosis not present

## 2023-07-18 NOTE — Therapy (Signed)
 OUTPATIENT PHYSICAL THERAPY TREATMENT   Patient Name: Johnny Boyer MRN: [id number] DOB:1956-02-13, 68 y.o., male Today's Date: 07/18/2023  END OF SESSION:  PT End of Session - 07/18/23 1525     Visit Number 9    Number of Visits 28    Date for PT Re-Evaluation 08/30/23    Authorization Type [employer]ET[employer] MCR    Progress Note Due on Visit 10    PT Start Time 1524    PT Stop Time 1602    PT Time Calculation (min) 38 min    Activity Tolerance Patient tolerated treatment well                     Past Medical History:  Diagnosis Date   Hemorrhoids    Hyperlipidemia    Myocardial infarction (HCC) 2009   Sleep apnea    no cpap used did not tolerate, moderate osa   Past Surgical History:  Procedure Laterality Date   CARDIAC CATHETERIZATION  2009   stents to mid rca x 2   EVALUATION UNDER ANESTHESIA WITH HEMORRHOIDECTOMY N/A 04/21/2020   Procedure: HEMORRHOIDECTOMY X3 WITH LIGATION AND HEMORRHOIDOPEXY ANORECTAL EXAMINATION UNDER ANESTHESIA;  Surgeon: Karie Soda, MD;  Location: [facility];  Service: General;  Laterality: N/A;   FRACTURE SURGERY  1974   right wrist   SPINE SURGERY  2008 and 2009   cervical C 3 C4 C5 fusion   Patient Active Problem List   Diagnosis Date Noted   History of coronary artery disease 12/15/2019   External hemorrhoids 12/15/2019   Dyslipidemia 12/15/2019     REFERRING PROVIDER: Huel Cote, MD  REFERRING DIAG: [phone] (ICD-10-CM) - Hip impingement syndrome, left s/p Left hip pincer debridement with labral repair and CAM debridement  THERAPY DIAG:  Difficulty in walking, not elsewhere classified  Pain in left hip  Muscle weakness (generalized)  Stiffness of left hip, not elsewhere classified  Rationale for Evaluation and Treatment: Rehabilitation  ONSET DATE: DOS  06/03/2022   SUBJECTIVE:   SUBJECTIVE STATEMENT: 07/18/2023 Pt arrives w/o complaint, no issues after last session, continues to endorse steady  progress. Sees surgeon tomorrow     Per eval - Pt reports his L hip pain began last year with no specific MOI.  Pt states his pain progressively worsened.  Pt received PT from September 2024 - January 2025 though had no improvement in pain.  Pt underwent Left hip pincer debridement with labral repair and CAM debridement on 2/18.  Post-op plan note indicated Weight bearing as tolerated and formal physical therapy to begin this week. Pt is limited with his normal functional mobility skills including ambulation and transfers.  He is using his hands to lift leg to get into bed and has difficulty with shower transfers.  Pt has difficulty with dressing and his wife ties his shoes.  Pt unable to run.  Pt is not working since surgery, but plans to work from home on Monday.    PERTINENT HISTORY: Left hip pincer debridement with labral repair and CAM debridement on 2/18 MI 2009 with stent placement Cervical fusion C3-5, 2008 and 2009 Pt states he has bulging discs, L4-5 L knee arthroscopic surgery  PAIN:  NPRS:  none currently, 4/10 at most with prolonged standing (>3-4 hours) Location:  lateral L hip, at incision  PRECAUTIONS: Other: labral repair protocol, cervical fusion  RED FLAGS: None   WEIGHT BEARING RESTRICTIONS: Yes WBAT  FALLS:  Has patient fallen in last 6 months? No  LIVING ENVIRONMENT: Lives with: lives with their spouse Lives in: 1 story apartment Stairs: no Has following equipment at home: bilat crutches, shower chair  OCCUPATION: pt works for a [employer].  50% on feet/50% sitting.  PLOF: Independent  Pt was running 4 days per week, 3 miles per day.    PATIENT GOALS: get back to running, to be able to perform the normal daily activities including tying shoes, reduce pain.  NEXT MD VISIT: 07/19/23  OBJECTIVE:  Note: Objective measures were completed at Evaluation unless otherwise noted.  DIAGNOSTIC FINDINGS: Pt had a MRI in July 2024 and is post op currently.    PATIENT SURVEYS:  LEFS 22/80  COGNITION: Overall cognitive status: Within functional limits for tasks assessed      OBSERVATION:  Pt not wearing a brace.  Pt has gauze and tegaderm over portals/incision.  Pt's incision and portals are intact with stiches.  Pt has bruising between portals and incision and has xeroform gauze over incision.  Pt has no signs of infection.   PT removed post op dressings and applied new gauze and tegaderm over portals and incision.  PT educated pt on dressings.      LOWER EXTREMITY ROM:  Not tested  LOWER EXTREMITY MMT:  Strength not tested due to healing constraints and surgical protocol.     GAIT: Assistive device utilized: Crutches Comments: Pt ambulates with bilat crutches with a step through pattern without increased pain.  At times, Pt is picking up his crutches and not using them as much.  He denies any increased pain.                                                                                                                                 TREATMENT DATE:   [facility] Adult PT Treatment:                                                DATE: 07/18/23 Therapeutic Exercise: 12 min recumbent bike during subjective Prone hip extension 2x15 Modified plank (knee/elbow) 3x15 sec Black band tall kneeling row 2x10 Black band paloff tall kneeling x10 BIL HEP update + education  Therapeutic Activity: Squat tap to raised mat 1/3 x8, 1/2 x8 BW squat x8 cues for appropriate depth Stairwell fwd step up 2x8 w/ rail    [facility] Adult PT Treatment:                                                DATE: 07/12/23 Therapeutic Exercise: Recumbent bike 15 min  Prone hip ext 2x8 BIL cues for reduced truncal compensations  Tall kneeling blue band row 2x12 Tall kneeling blue band  paloff x12 BIL HEP discussion/education  Therapeutic Activity: Squat tap 1/3 to raised mat 3x12 SLS weaning UE support 3x45sec     [facility] Adult PT Treatment:                                                 DATE: 07/09/23 Therapeutic Exercise: Upright bike L5 x57min L hip PROM  Prone hamstring curl x12 unresisted, red band x8 BIL  Bridge w 3 sec iso hold 2x8  Row in tall kneeling; green band x30 Tall kneeling paloff press  green band x30 BIL Quadruped rocking x20 ea  Squat tap to raised surface (1/3) 2x10 SLS trials  Standing hip abduction 2.5# cuff weights 3x10ea    PATIENT EDUCATION:  Education details: rationale for interventions, HEP  Person educated: Patient Education method: Explanation, Demonstration, Tactile cues, Verbal cues Education comprehension: verbalized understanding, returned demonstration, verbal cues required, tactile cues required, and needs further education     HOME EXERCISE PROGRAM: Access Code: [email or web address] URL: [email or web address] Date: 07/18/2023 Prepared by: Fransisco Hertz  Program Notes -with row, please perform tall kneeling as done in clinic  Exercises - Supine Heel Slide  - 2 x daily - 7 x weekly - 2-3 sets - 10 reps - Supine Posterior Pelvic Tilt  - 2 x daily - 7 x weekly - 2-3 sets - 10 reps - Beginner Bridge  - 3-4 x weekly - 1-2 sets - 8-10 reps - Quadruped Rocking Slow  - 3-4 x weekly - 1-2 sets - 8-10 reps - Quadruped Pelvic Tilt  - 3-4 x weekly - 1-2 sets - 10 reps - Stride Stance Weight Shift  - 3-4 x weekly - 1-2 sets - 10 reps - Tall Kneeling Anti-Rotation Press  - 3-4 x weekly - 2 sets - 8 reps - Standing Shoulder Row with Anchored Resistance  - 1 x daily - 3-4 x weekly - 2 sets - 8 reps - Sidelying Hip Abduction  - 1-2 x daily - 7 x weekly - 3 sets - 10 reps - Prone Hip Extension  - 1-2 x daily - 7 x weekly - 2 sets - 8 reps - Plank on Knees  - 3-4 x weekly - 2-3 sets - 15-20sec hold - Mini Squat  - 3-4 x weekly - 2-3 sets - 8 reps  ASSESSMENT:  CLINICAL IMPRESSION: 07/18/2023 Pt arrives w/o pain, continues to report steady progress. Sees surgeon tomorrow. He continues to progress well per post op  protocol, now >6 weeks out so we are able to progress for forward step ups, BW squats within limited/controlled ROM, and modified planks. He tolerates these well without pain, cues as above with emphasis on control and reduced compensatory momentum. No adverse events. Recommend continuing along current POC in order to address relevant deficits and improve functional tolerance. Pt departs today's session in no acute distress, all voiced questions/concerns addressed appropriately from PT perspective.     Eval: Patient is a 68 y.o. male 3 days s/p s/p Left hip pincer debridement with labral repair and CAM debridement presenting to the clinic with expected post op limitations including L hip pain, limited hip ROM, muscle weakness, and difficulty in walking.  Pt is limited with his normal functional mobility skills including ambulation and transfers.  Pt has difficulty with dressing.  Pt is an avid runner and is unable to run  due to surgery.  He is not performing his normal work activities currently.  Pt should benefit from skilled PT per protocol to address impairments and improve overall function.      OBJECTIVE IMPAIRMENTS: Abnormal gait, decreased activity tolerance, decreased mobility, difficulty walking, decreased ROM, decreased strength, hypomobility, impaired flexibility, and pain.   ACTIVITY LIMITATIONS: carrying, lifting, bending, standing, squatting, stairs, transfers, bed mobility, dressing, and locomotion level  PARTICIPATION LIMITATIONS: cleaning, community activity, occupation, and running  PERSONAL FACTORS: 1 comorbidity: cervical fusion  are also affecting patient's functional outcome.   REHAB POTENTIAL: Good  CLINICAL DECISION MAKING: Stable/uncomplicated  EVALUATION COMPLEXITY: Low   GOALS:   SHORT TERM GOALS:  Pt will be independent and compliant with HEP for improved pain, ROM, strength, and function.  Baseline: Goal status: INITIAL Target date: 07/05/2023  2.  Pt will  progress with PROM per protocol without adverse effects for improved stiffness and mobility.  Baseline:  Goal status: INITIAL Target date: 07/05/2023  3.  Pt will wean off of crutches without adverse effects  Baseline:  Goal status: INITIAL Target date:  07/05/2023  4.  Pt will progress with exercises per protocol without adverse effects for improved strength and mobility.  Baseline:  Goal status: INITIAL Target date:  07/19/2023   5.  Pt will perform a 6 inch step up with good form and good stability.   Baseline:  Goal status: INITIAL Target date:  08/02/23   6.  Pt will ambulate with a normalized heel to toe gait pattern without limping.  Baseline:  Goal status: INITIAL Target date: 08/16/23   7.  Pt will have no pain and demo good form with squatting for improved function strength and to assist with functional mobility.  Baseline:  Goal status: INITIAL Target date:  08/23/2023     LONG TERM GOALS: Target date: 09/26/2023   Pt's L hip AROM will be [facility] for improved stiffness and daily mobility  Baseline:  Goal status: INITIAL  2.  Pt will ambulate extended community distance without increased pain and significant difficulty.  Baseline:  Goal status: INITIAL  3.  Pt will be able to perform all of his ADLs/IADLs and functional mobility skills without significant pain and limitation.  Baseline:  Goal status: INITIAL  4.   Pt will be able to ascend and descend stairs with a reciprocal gait with good control without increased L hip pain.  Baseline:  Goal status: INITIAL  5.  Pt will demo 4+/5 strength in L hip abd and ext, 4 to 4+/5 in L hip flex, and 5/5 in L knee ext for improved performance of and tolerance with functional mobility.  Baseline:  Goal status: INITIAL    PLAN:  PT FREQUENCY:  1x/wk x 3-4 weeks and 2x/wk afterwards  PT DURATION: other: 16 weeks  PLANNED INTERVENTIONS: 97164- PT Re-evaluation, 97110-Therapeutic exercises, 97530- Therapeutic activity,  97112- Neuromuscular re-education, 97535- Self Care, 16109- Manual therapy, [phone]- Gait training, [phone]- Aquatic Therapy, [phone]- Electrical stimulation (unattended), [phone]- Electrical stimulation (manual), [id number]- Ultrasound, Patient/Family education, Balance training, Stair training, Taping, Dry Needling, Joint mobilization, Scar mobilization, Cryotherapy, and Moist heat  PLAN FOR NEXT SESSION: Cont per Dr. Serena Croissant labral repair protocol.    Ashley Murrain PT, DPT 07/18/2023 4:09 PM

## 2023-07-19 ENCOUNTER — Ambulatory Visit (HOSPITAL_BASED_OUTPATIENT_CLINIC_OR_DEPARTMENT_OTHER): Admitting: Orthopaedic Surgery

## 2023-07-19 DIAGNOSIS — M25852 Other specified joint disorders, left hip: Secondary | ICD-10-CM

## 2023-07-19 NOTE — Progress Notes (Signed)
 Post Operative Evaluation    Procedure/Date of Surgery: Left hip arthroscopy with cam debridement 2/18  Interval History:   Presents today 6 weeks status post above procedure.  He is doing extremely well.  He does have some soreness in the groin particular when sitting for longer periods.  He is interested in a return to running program at this time.  PMH/PSH/Family History/Social History/Meds/Allergies:    Past Medical History:  Diagnosis Date   Hemorrhoids    Hyperlipidemia    Myocardial infarction (HCC) 2009   Sleep apnea    no cpap used did not tolerate, moderate osa   Past Surgical History:  Procedure Laterality Date   CARDIAC CATHETERIZATION  2009   stents to mid rca x 2   EVALUATION UNDER ANESTHESIA WITH HEMORRHOIDECTOMY N/A 04/21/2020   Procedure: HEMORRHOIDECTOMY X3 WITH LIGATION AND HEMORRHOIDOPEXY ANORECTAL EXAMINATION UNDER ANESTHESIA;  Surgeon: Karie Soda, MD;  Location: [facility] [place];  Service: General;  Laterality: N/A;   FRACTURE SURGERY  1974   right wrist   SPINE SURGERY  2008 and 2009   cervical C 3 C4 C5 fusion   Social History   Socioeconomic History   Marital status: Married    Spouse name: Not on file   Number of children: Not on file   Years of education: Not on file   Highest education level: Doctorate  Occupational History   Not on file  Tobacco Use   Smoking status: Never   Smokeless tobacco: Never  Vaping Use   Vaping status: Never Used  Substance and Sexual Activity   Alcohol use: Yes    Comment: 1 or 2 beers per day   Drug use: Never   Sexual activity: Not on file  Other Topics Concern   Not on file  Social History Narrative   Not on file   Social Drivers of Health   Financial Resource Strain: Low Risk  (10/15/2022)   Overall Financial Resource Strain (CARDIA)    Difficulty of Paying Living Expenses: Not hard at all  Food Insecurity: No Food Insecurity (10/15/2022)   Hunger Vital  Sign    Worried About Running Out of Food in the Last Year: Never true    Ran Out of Food in the Last Year: Never true  Transportation Needs: No Transportation Needs (10/15/2022)   PRAPARE - Administrator, Civil Service (Medical): No    Lack of Transportation (Non-Medical): No  Physical Activity: Sufficiently Active (10/15/2022)   Exercise Vital Sign    Days of Exercise per Week: 5 days    Minutes of Exercise per Session: 40 min  Stress: No Stress Concern Present (10/15/2022)   [employer] of Occupational Health - Occupational Stress Questionnaire    Feeling of Stress : Not at all  Social Connections: Moderately Integrated (10/15/2022)   Social Connection and Isolation Panel [NHANES]    Frequency of Communication with Friends and Family: Once a week    Frequency of Social Gatherings with Friends and Family: More than three times a week    Attends Religious Services: Never    Database administrator or Organizations: Yes    Attends Engineer, structural: More than 4 times per year    Marital Status: Married   Family History  Problem Relation Age of Onset  Heart disease Mother    Hyperlipidemia Mother    Hyperlipidemia Sister    Hypertension Sister    Hyperlipidemia Brother    Hypertension Brother    No Known Allergies Current Outpatient Medications  Medication Sig Dispense Refill   aspirin EC 325 MG tablet Take 1 tablet (325 mg total) by mouth daily. 14 tablet 0   aspirin EC 81 MG tablet Take 81 mg by mouth daily.     atorvastatin (LIPITOR) 80 MG tablet TAKE 1 TABLET BY MOUTH EVERY DAY 90 tablet 2   oxyCODONE (ROXICODONE) 5 MG immediate release tablet Take 1 tablet (5 mg total) by mouth every 4 (four) hours as needed for severe pain (pain score 7-10) or breakthrough pain. 15 tablet 0   No current facility-administered medications for this visit.   No results found.  Review of Systems:   A ROS was performed including pertinent positives and negatives as  documented in the HPI.   Musculoskeletal Exam:    There were no vitals taken for this visit.  Left hip incisions are well-appearing.  Range of motion is 30 degrees internal and external rotation about the left hip without pain.  There is excellent abduction strength with no antalgic gait  Imaging:      I personally reviewed and interpreted the radiographs.   Assessment:   6-week status post left hip arthroscopy with cam debridement overall doing extremely well.  At this time I would like him to work through return to jog progression.  He will see [place] for 1 final visit prior to his departure for [place]  Plan :    -He will return to clinic prior to his departure to [place]. Lissa Hoard      I personally saw and evaluated the patient, and participated in the management and treatment plan.  Huel Cote, MD Attending Physician, Orthopedic Surgery  This document was dictated using Dragon voice recognition software. A reasonable attempt at proof reading has been made to minimize errors.

## 2023-07-23 ENCOUNTER — Ambulatory Visit (HOSPITAL_BASED_OUTPATIENT_CLINIC_OR_DEPARTMENT_OTHER): Admitting: Physical Therapy

## 2023-07-26 ENCOUNTER — Other Ambulatory Visit (HOSPITAL_COMMUNITY): Payer: Medicare HMO

## 2023-07-26 ENCOUNTER — Encounter (HOSPITAL_COMMUNITY): Payer: Medicare HMO

## 2023-07-31 ENCOUNTER — Encounter (HOSPITAL_BASED_OUTPATIENT_CLINIC_OR_DEPARTMENT_OTHER): Payer: Self-pay

## 2023-07-31 ENCOUNTER — Ambulatory Visit (HOSPITAL_BASED_OUTPATIENT_CLINIC_OR_DEPARTMENT_OTHER)

## 2023-07-31 DIAGNOSIS — M25652 Stiffness of left hip, not elsewhere classified: Secondary | ICD-10-CM | POA: Diagnosis not present

## 2023-07-31 DIAGNOSIS — M6281 Muscle weakness (generalized): Secondary | ICD-10-CM

## 2023-07-31 DIAGNOSIS — R262 Difficulty in walking, not elsewhere classified: Secondary | ICD-10-CM

## 2023-07-31 DIAGNOSIS — M25552 Pain in left hip: Secondary | ICD-10-CM

## 2023-07-31 NOTE — Addendum Note (Signed)
 Addended by: Grier Leber on: 07/31/2023 11:22 PM   Modules accepted: Orders

## 2023-07-31 NOTE — Therapy (Addendum)
 OUTPATIENT PHYSICAL THERAPY TREATMENT Progress Note Reporting Period 06/07/2023 to 08/30/2023  See note below for Objective Data and Assessment of Progress/Goals.       Patient Name: Johnny Boyer MRN: [id number] DOB:1955/09/20, 68 y.o., male Today's Date: 07/31/2023  END OF SESSION:  PT End of Session - 07/31/23 1605     Visit Number 10    Number of Visits 28    Date for PT Re-Evaluation 09/26/23    Authorization Type [employer]ET[employer] MCR    Progress Note Due on Visit 10    PT Start Time 1603    PT Stop Time 1648    PT Time Calculation (min) 45 min    Activity Tolerance Patient tolerated treatment well    Behavior During Therapy [facility] for tasks assessed/performed                      Past Medical History:  Diagnosis Date   Hemorrhoids    Hyperlipidemia    Myocardial infarction (HCC) 2009   Sleep apnea    no cpap used did not tolerate, moderate osa   Past Surgical History:  Procedure Laterality Date   CARDIAC CATHETERIZATION  2009   stents to mid rca x 2   EVALUATION UNDER ANESTHESIA WITH HEMORRHOIDECTOMY N/A 04/21/2020   Procedure: HEMORRHOIDECTOMY X3 WITH LIGATION AND HEMORRHOIDOPEXY ANORECTAL EXAMINATION UNDER ANESTHESIA;  Surgeon: Candyce Champagne, MD;  Location: [facility] [place];  Service: General;  Laterality: N/A;   FRACTURE SURGERY  1974   right wrist   SPINE SURGERY  2008 and 2009   cervical C 3 C4 C5 fusion   Patient Active Problem List   Diagnosis Date Noted   History of coronary artery disease 12/15/2019   External hemorrhoids 12/15/2019   Dyslipidemia 12/15/2019     REFERRING PROVIDER: Wilhelmenia Harada, MD  REFERRING DIAG: [phone] (ICD-10-CM) - Hip impingement syndrome, left s/p Left hip pincer debridement with labral repair and CAM debridement  THERAPY DIAG:  Stiffness of left hip, not elsewhere classified  Muscle weakness (generalized)  Difficulty in walking, not elsewhere classified  Pain in left hip  Rationale for Evaluation  and Treatment: Rehabilitation  ONSET DATE: DOS  06/03/2022   SUBJECTIVE:   SUBJECTIVE STATEMENT: 07/31/2023 Pt reports MD cleared him for running. Has started jog/walk intervals without complaints. Has done this 3x this weak for about durations.    Per eval - Pt reports his L hip pain began last year with no specific MOI.  Pt states his pain progressively worsened.  Pt received PT from September 2024 - January 2025 though had no improvement in pain.  Pt underwent Left hip pincer debridement with labral repair and CAM debridement on 2/18.  Post-op plan note indicated Weight bearing as tolerated and formal physical therapy to begin this week. Pt is limited with his normal functional mobility skills including ambulation and transfers.  He is using his hands to lift leg to get into bed and has difficulty with shower transfers.  Pt has difficulty with dressing and his wife ties his shoes.  Pt unable to run.  Pt is not working since surgery, but plans to work from home on Monday.    PERTINENT HISTORY: Left hip pincer debridement with labral repair and CAM debridement on 2/18 MI 2009 with stent placement Cervical fusion C3-5, 2008 and 2009 Pt states he has bulging discs, L4-5 L knee arthroscopic surgery  PAIN:  NPRS:  none currently, 4/10 at most with prolonged standing (>3-4 hours)  Location:  lateral L hip, at incision  PRECAUTIONS: Other: labral repair protocol, cervical fusion  RED FLAGS: None   WEIGHT BEARING RESTRICTIONS: Yes WBAT  FALLS:  Has patient fallen in last 6 months? No  LIVING ENVIRONMENT: Lives with: lives with their spouse Lives in: 1 story apartment Stairs: no Has following equipment at home: bilat crutches, shower chair  OCCUPATION: pt works for a [employer].  50% on feet/50% sitting.  PLOF: Independent  Pt was running 4 days per week, 3 miles per day.    PATIENT GOALS: get back to running, to be able to perform the normal daily activities  including tying shoes, reduce pain.  NEXT MD VISIT: 07/19/23  OBJECTIVE:  Note: Objective measures were completed at Evaluation unless otherwise noted.  LOWER EXTREMITY ROM:     Active  Right 4/16 Left 4/16  Hip flexion 105deg 103deg  Hip extension    Hip abduction 25deg 25deg  Hip adduction    Hip internal rotation 30deg 30deg  Hip external rotation 30deg 38deg  Knee flexion    Knee extension    Ankle dorsiflexion    Ankle plantarflexion    Ankle inversion    Ankle eversion     (Blank rows = not tested)    LOWER EXTREMITY MMT:    MMT Right 4/16 Left 4/16  Hip flexion    Hip extension 79.8 78.5  Hip abduction 56.5 48.2  Hip adduction    Hip internal rotation 43.5 38.0  Hip external rotation 43.8 41.9  Knee flexion    Knee extension 60.0 57.8  Ankle dorsiflexion    Ankle plantarflexion    Ankle inversion    Ankle eversion     (Blank rows = not tested)   DIAGNOSTIC FINDINGS: Pt had a MRI in July 2024 and is post op currently.   PATIENT SURVEYS:  LEFS 22/80 4/16: 67/80  COGNITION: Overall cognitive status: Within functional limits for tasks assessed                                                                                                                                    TREATMENT DATE:     Treatment                            07/31/23:  Elliptical x70min L3 Standing hip 3 way RTB at ankles 2x15 ea bilateral Updated ROM and MMT Updated HEP  Stairs x1 flight reciprocal Squats x10 Staggered sit to stands L posterior x10 Squat taps to plinth x10   [facility] Adult PT Treatment:                                                DATE: 07/18/23 Therapeutic Exercise: 12 min recumbent  bike during subjective Prone hip extension 2x15 Modified plank (knee/elbow) 3x15 sec Black band tall kneeling row 2x10 Black band paloff tall kneeling x10 BIL HEP update + education  Therapeutic Activity: Squat tap to raised mat 1/3 x8, 1/2 x8 BW squat x8 cues for  appropriate depth Stairwell fwd step up 2x8 w/ rail    [facility] Adult PT Treatment:                                                DATE: 07/12/23 Therapeutic Exercise: Recumbent bike 15 min  Prone hip ext 2x8 BIL cues for reduced truncal compensations  Tall kneeling blue band row 2x12 Tall kneeling blue band paloff x12 BIL HEP discussion/education  Therapeutic Activity: Squat tap 1/3 to raised mat 3x12 SLS weaning UE support 3x45sec     [facility] Adult PT Treatment:                                                DATE: 07/09/23 Therapeutic Exercise: Upright bike L5 x36min L hip PROM  Prone hamstring curl x12 unresisted, red band x8 BIL  Bridge w 3 sec iso hold 2x8  Row in tall kneeling; green band x30 Tall kneeling paloff press  green band x30 BIL Quadruped rocking x20 ea  Squat tap to raised surface (1/3) 2x10 SLS trials  Standing hip abduction 2.5# cuff weights 3x10ea    PATIENT EDUCATION:  Education details: rationale for interventions, HEP  Person educated: Patient Education method: Explanation, Demonstration, Tactile cues, Verbal cues Education comprehension: verbalized understanding, returned demonstration, verbal cues required, tactile cues required, and needs further education     HOME EXERCISE PROGRAM: Access Code: [email or web address] URL: [email or web address] Date: 07/18/2023 Prepared by: Mayme Spearman  Program Notes -with row, please perform tall kneeling as done in clinic  Exercises - Supine Heel Slide  - 2 x daily - 7 x weekly - 2-3 sets - 10 reps - Supine Posterior Pelvic Tilt  - 2 x daily - 7 x weekly - 2-3 sets - 10 reps - Beginner Bridge  - 3-4 x weekly - 1-2 sets - 8-10 reps - Quadruped Rocking Slow  - 3-4 x weekly - 1-2 sets - 8-10 reps - Quadruped Pelvic Tilt  - 3-4 x weekly - 1-2 sets - 10 reps - Stride Stance Weight Shift  - 3-4 x weekly - 1-2 sets - 10 reps - Tall Kneeling Anti-Rotation Press  - 3-4 x weekly - 2 sets - 8 reps - Standing Shoulder  Row with Anchored Resistance  - 1 x daily - 3-4 x weekly - 2 sets - 8 reps - Sidelying Hip Abduction  - 1-2 x daily - 7 x weekly - 3 sets - 10 reps - Prone Hip Extension  - 1-2 x daily - 7 x weekly - 2 sets - 8 reps - Plank on Knees  - 3-4 x weekly - 2-3 sets - 15-20sec hold - Mini Squat  - 3-4 x weekly - 2-3 sets - 8 reps  ASSESSMENT:  CLINICAL IMPRESSION: 07/31/2023 Pt has attended 10 visits of PT thus far and has made excellent progress.Pt is now 8 weeks s/p.  Self reports ~85% improvement in function. Has returned to [employer]. Remains challenged  by squatting low. Does experience a "twinge" in hip when raising from commode. Does well with other surfaces. Performed ROM and strength testing today, showing WNL ROM values for L hip. Slight strength deficit remains in L hip compared to R, especially with hip abduction. LEFS score increased significantly from eval. Pt has met 4/7 STG and 3/5 LTG.  Updated HEP today to reflect progressions and address remaining deficits. Will continue to monitor pt as he progresses with jogging program.    Eval: Patient is a 68 y.o. male 3 days s/p s/p Left hip pincer debridement with labral repair and CAM debridement presenting to the clinic with expected post op limitations including L hip pain, limited hip ROM, muscle weakness, and difficulty in walking.  Pt is limited with his normal functional mobility skills including ambulation and transfers.  Pt has difficulty with dressing.  Pt is an avid runner and is unable to run due to surgery.  He is not performing his normal work activities currently.  Pt should benefit from skilled PT per protocol to address impairments and improve overall function.      OBJECTIVE IMPAIRMENTS: Abnormal gait, decreased activity tolerance, decreased mobility, difficulty walking, decreased ROM, decreased strength, hypomobility, impaired flexibility, and pain.   ACTIVITY LIMITATIONS: carrying, lifting, bending, standing,  squatting, stairs, transfers, bed mobility, dressing, and locomotion level  PARTICIPATION LIMITATIONS: cleaning, community activity, occupation, and running  PERSONAL FACTORS: 1 comorbidity: cervical fusion  are also affecting patient's functional outcome.   REHAB POTENTIAL: Good  CLINICAL DECISION MAKING: Stable/uncomplicated  EVALUATION COMPLEXITY: Low   GOALS:   SHORT TERM GOALS:  Pt will be independent and compliant with HEP for improved pain, ROM, strength, and function.  Baseline: Goal status: MET 4/16 Target date: 07/05/2023  2.  Pt will progress with PROM per protocol without adverse effects for improved stiffness and mobility.  Baseline:  Goal status: MET 4/16 Target date: 07/05/2023  3.  Pt will wean off of crutches without adverse effects  Baseline:  Goal status: MET 4/16 Target date:  07/05/2023  4.  Pt will progress with exercises per protocol without adverse effects for improved strength and mobility.  Baseline:  Goal status: IN PROGRESS 4/16 Target date:  07/19/2023   5.  Pt will perform a 6 inch step up with good form and good stability.   Baseline:  Goal status: MET 4/17=6 Target date:  08/02/23   6.  Pt will ambulate with a normalized heel to toe gait pattern without limping.  Baseline:  Goal status: IN PROGRESS (limps after sitting for prolonged period) 4/16 Target date: 08/16/23   7.  Pt will have no pain and demo good form with squatting for improved function strength and to assist with functional mobility.  Baseline:  Goal status: IN PROGRESS 4/16  Target date:  08/23/2023     LONG TERM GOALS: Target date: 09/26/2023   Pt's L hip AROM will be [facility] for improved stiffness and daily mobility  Baseline:  Goal status: MET 4/16  2.  Pt will ambulate extended community distance without increased pain and significant difficulty.  Baseline:  Goal status: MET 4/16  3.  Pt will be able to perform all of his ADLs/IADLs and functional mobility skills  without significant pain and limitation.  Baseline:  Goal status: IN PROGRESS 4/16  4.   Pt will be able to ascend and descend stairs with a reciprocal gait with good control without increased L hip pain.  Baseline:  Goal status: MET 4/16  5.  Pt will demo 4+/5 strength in L hip abd and ext, 4 to 4+/5 in L hip flex, and 5/5 in L knee ext for improved performance of and tolerance with functional mobility.  Baseline:  Goal status: IN PROGRESS 4/16    PLAN:  PT FREQUENCY:  2x/wk   PT DURATION: other: 8-9 weeks  PLANNED INTERVENTIONS: 97164- PT Re-evaluation, 97110-Therapeutic exercises, 97530- Therapeutic activity, 97112- Neuromuscular re-education, 97535- Self Care, 16109- Manual therapy, [phone]- Gait training, [phone]- Aquatic Therapy, [phone]- Electrical stimulation (unattended), [phone]- Electrical stimulation (manual), [id number]- Ultrasound, Patient/Family education, Balance training, Stair training, Taping, Dry Needling, Joint mobilization, Scar mobilization, Cryotherapy, and Moist heat  PLAN FOR NEXT SESSION: Cont per Dr. Verline Glow labral repair protocol.    Herb Loges, PTA  07/31/2023 5:02 PM   PTA administered treatment today and PT reviewed note.  PT extended POC date and will send re-cert to MD. Trina Fujita III PT, DPT 07/31/23 11:20 PM

## 2023-08-05 ENCOUNTER — Ambulatory Visit (HOSPITAL_BASED_OUTPATIENT_CLINIC_OR_DEPARTMENT_OTHER)

## 2023-08-05 ENCOUNTER — Encounter (HOSPITAL_BASED_OUTPATIENT_CLINIC_OR_DEPARTMENT_OTHER): Payer: Self-pay

## 2023-08-05 DIAGNOSIS — M25652 Stiffness of left hip, not elsewhere classified: Secondary | ICD-10-CM

## 2023-08-05 DIAGNOSIS — M6281 Muscle weakness (generalized): Secondary | ICD-10-CM | POA: Diagnosis not present

## 2023-08-05 DIAGNOSIS — M25552 Pain in left hip: Secondary | ICD-10-CM

## 2023-08-05 DIAGNOSIS — R262 Difficulty in walking, not elsewhere classified: Secondary | ICD-10-CM

## 2023-08-05 NOTE — Therapy (Signed)
 OUTPATIENT PHYSICAL THERAPY TREATMENT Progress Note Reporting Period 06/07/2023 to 08/30/2023  See note below for Objective Data and Assessment of Progress/Goals.       Patient Name: Johnny Boyer MRN: [id number] DOB:02-Oct-1955, 68 y.o., male Today's Date: 08/05/2023  END OF SESSION:  PT End of Session - 07/31/23 1605     Visit Number 10    Number of Visits 28    Date for PT Re-Evaluation 09/26/23    Authorization Type [employer] MCR    Progress Note Due on Visit 10    PT Start Time 1603    PT Stop Time 1648    PT Time Calculation (min) 45 min    Activity Tolerance Patient tolerated treatment well    Behavior During Therapy [facility] for tasks assessed/performed                      Past Medical History:  Diagnosis Date   Hemorrhoids    Hyperlipidemia    Myocardial infarction (HCC) 2009   Sleep apnea    no cpap used did not tolerate, moderate osa   Past Surgical History:  Procedure Laterality Date   CARDIAC CATHETERIZATION  2009   stents to mid rca x 2   EVALUATION UNDER ANESTHESIA WITH HEMORRHOIDECTOMY N/A 04/21/2020   Procedure: HEMORRHOIDECTOMY X3 WITH LIGATION AND HEMORRHOIDOPEXY ANORECTAL EXAMINATION UNDER ANESTHESIA;  Surgeon: Candyce Champagne, MD;  Location: [facility];  Service: General;  Laterality: N/A;   FRACTURE SURGERY  1974   right wrist   SPINE SURGERY  2008 and 2009   cervical C 3 C4 C5 fusion   Patient Active Problem List   Diagnosis Date Noted   History of coronary artery disease 12/15/2019   External hemorrhoids 12/15/2019   Dyslipidemia 12/15/2019     REFERRING PROVIDER: Wilhelmenia Harada, MD  REFERRING DIAG: [phone] (ICD-10-CM) - Hip impingement syndrome, left s/p Left hip pincer debridement with labral repair and CAM debridement  THERAPY DIAG:  Stiffness of left hip, not elsewhere classified  Muscle weakness (generalized)  Pain in left hip  Difficulty in walking, not elsewhere classified  Rationale for Evaluation  and Treatment: Rehabilitation  ONSET DATE: DOS  06/03/2022   SUBJECTIVE:   SUBJECTIVE STATEMENT: 08/05/2023 Pt reports he was performing his staggered sit to stands at home on Friday which resulted in increased left sided low back pain. "I didn't do more exercise that weekend." Pt reports he is able to run without discomfort. Sitting bothers him the most.    Per eval - Pt reports his L hip pain began last year with no specific MOI.  Pt states his pain progressively worsened.  Pt received PT from September 2024 - January 2025 though had no improvement in pain.  Pt underwent Left hip pincer debridement with labral repair and CAM debridement on 2/18.  Post-op plan note indicated Weight bearing as tolerated and formal physical therapy to begin this week. Pt is limited with his normal functional mobility skills including ambulation and transfers.  He is using his hands to lift leg to get into bed and has difficulty with shower transfers.  Pt has difficulty with dressing and his wife ties his shoes.  Pt unable to run.  Pt is not working since surgery, but plans to work from home on Monday.    PERTINENT HISTORY: Left hip pincer debridement with labral repair and CAM debridement on 2/18 MI 2009 with stent placement Cervical fusion C3-5, 2008 and 2009 Pt states he has bulging  discs, L4-5 L knee arthroscopic surgery  PAIN:  NPRS:  none currently, 4/10 at most with prolonged standing (>3-4 hours) Location:  lateral L hip, at incision  PRECAUTIONS: Other: labral repair protocol, cervical fusion  RED FLAGS: None   WEIGHT BEARING RESTRICTIONS: Yes WBAT  FALLS:  Has patient fallen in last 6 months? No  LIVING ENVIRONMENT: Lives with: lives with their spouse Lives in: 1 story apartment Stairs: no Has following equipment at home: bilat crutches, shower chair  OCCUPATION: pt works for a [employer].  50% on feet/50% sitting.  PLOF: Independent  Pt was running 4 days per week, 3 miles per  day.    PATIENT GOALS: get back to running, to be able to perform the normal daily activities including tying shoes, reduce pain.  NEXT MD VISIT: 07/19/23  OBJECTIVE:  Note: Objective measures were completed at Evaluation unless otherwise noted.  LOWER EXTREMITY ROM:     Active  Right 4/16 Left 4/16  Hip flexion 105deg 103deg  Hip extension    Hip abduction 25deg 25deg  Hip adduction    Hip internal rotation 30deg 30deg  Hip external rotation 30deg 38deg  Knee flexion    Knee extension    Ankle dorsiflexion    Ankle plantarflexion    Ankle inversion    Ankle eversion     (Blank rows = not tested)    LOWER EXTREMITY MMT:    MMT Right 4/16 Left 4/16  Hip flexion    Hip extension 79.8 78.5  Hip abduction 56.5 48.2  Hip adduction    Hip internal rotation 43.5 38.0  Hip external rotation 43.8 41.9  Knee flexion    Knee extension 60.0 57.8  Ankle dorsiflexion    Ankle plantarflexion    Ankle inversion    Ankle eversion     (Blank rows = not tested)   DIAGNOSTIC FINDINGS: Pt had a MRI in July 2024 and is post op currently.   PATIENT SURVEYS:  LEFS 22/80 4/16: 67/80  COGNITION: Overall cognitive status: Within functional limits for tasks assessed                                                                                                                                    TREATMENT DATE:     Treatment                            08/05/23:  Seated lumbar flexion stretch DKTC 15sec x4 PPT 5" x10 LTR 5" x10ea Manual HSS bil Manual hip ER/IR stretching Quadruped cat/cow 5" hold x10 Child's pose 15" x4 Cable walks 25lb retro x8, 20lb fwd x8 Standing hip 3 way RTB at ankles 2x10 ea bilateral Squats at back of bike 2x10 Supine SLR with TA 2x10   Treatment  07/31/23:  Elliptical x36min L3 Standing hip 3 way RTB at ankles 2x15 ea bilateral Updated ROM and MMT Updated HEP  Stairs x1 flight reciprocal Squats x10 Staggered sit  to stands L posterior x10 Squat taps to plinth x10   [facility] Adult PT Treatment:                                                DATE: 07/18/23 Therapeutic Exercise: 12 min recumbent bike during subjective Prone hip extension 2x15 Modified plank (knee/elbow) 3x15 sec Black band tall kneeling row 2x10 Black band paloff tall kneeling x10 BIL HEP update + education  Therapeutic Activity: Squat tap to raised mat 1/3 x8, 1/2 x8 BW squat x8 cues for appropriate depth Stairwell fwd step up 2x8 w/ rail    [facility] Adult PT Treatment:                                                DATE: 07/12/23 Therapeutic Exercise: Recumbent bike 15 min  Prone hip ext 2x8 BIL cues for reduced truncal compensations  Tall kneeling blue band row 2x12 Tall kneeling blue band paloff x12 BIL HEP discussion/education  Therapeutic Activity: Squat tap 1/3 to raised mat 3x12 SLS weaning UE support 3x45sec     [facility] Adult PT Treatment:                                                DATE: 07/09/23 Therapeutic Exercise: Upright bike L5 x36min L hip PROM  Prone hamstring curl x12 unresisted, red band x8 BIL  Bridge w 3 sec iso hold 2x8  Row in tall kneeling; green band x30 Tall kneeling paloff press  green band x30 BIL Quadruped rocking x20 ea  Squat tap to raised surface (1/3) 2x10 SLS trials  Standing hip abduction 2.5# cuff weights 3x10ea    PATIENT EDUCATION:  Education details: rationale for interventions, HEP  Person educated: Patient Education method: Explanation, Demonstration, Tactile cues, Verbal cues Education comprehension: verbalized understanding, returned demonstration, verbal cues required, tactile cues required, and needs further education     HOME EXERCISE PROGRAM: Access Code: [email or web address] URL: [email or web address] Date: 07/18/2023 Prepared by: Mayme Spearman  Program Notes -with row, please perform tall kneeling as done in clinic  Exercises - Supine Heel Slide  - 2 x daily  - 7 x weekly - 2-3 sets - 10 reps - Supine Posterior Pelvic Tilt  - 2 x daily - 7 x weekly - 2-3 sets - 10 reps - Beginner Bridge  - 3-4 x weekly - 1-2 sets - 8-10 reps - Quadruped Rocking Slow  - 3-4 x weekly - 1-2 sets - 8-10 reps - Quadruped Pelvic Tilt  - 3-4 x weekly - 1-2 sets - 10 reps - Stride Stance Weight Shift  - 3-4 x weekly - 1-2 sets - 10 reps - Tall Kneeling Anti-Rotation Press  - 3-4 x weekly - 2 sets - 8 reps - Standing Shoulder Row with Anchored Resistance  - 1 x daily - 3-4 x weekly - 2 sets - 8 reps - Sidelying Hip Abduction  -  1-2 x daily - 7 x weekly - 3 sets - 10 reps - Prone Hip Extension  - 1-2 x daily - 7 x weekly - 2 sets - 8 reps - Plank on Knees  - 3-4 x weekly - 2-3 sets - 15-20sec hold - Mini Squat  - 3-4 x weekly - 2-3 sets - 8 reps  ASSESSMENT:  CLINICAL IMPRESSION: 08/05/2023 Began session with stretching to address lumbar complaints. He reported increase in discomfort with seated fwd lumbar flexion, so stopped this. Tightness noted with DKTC. Pt required cues with exercises to decrease range to avoid increased discomfort. Notable tightness in bil HS, so instructed pt to continue to stretch this at home gently within pain limits. Good tolerance for initiation of eccentric cable walk, though decreased step length noted. With supine SLR worked on [employer]. Instructed pt to work on lumbar stretching and use of heat. Pt to hold on staggered STS for now.   Eval: Patient is a 68 y.o. male 3 days s/p s/p Left hip pincer debridement with labral repair and CAM debridement presenting to the clinic with expected post op limitations including L hip pain, limited hip ROM, muscle weakness, and difficulty in walking.  Pt is limited with his normal functional mobility skills including ambulation and transfers.  Pt has difficulty with dressing.  Pt is an avid runner and is unable to run due to surgery.  He is not performing his normal work activities currently.  Pt should  benefit from skilled PT per protocol to address impairments and improve overall function.      OBJECTIVE IMPAIRMENTS: Abnormal gait, decreased activity tolerance, decreased mobility, difficulty walking, decreased ROM, decreased strength, hypomobility, impaired flexibility, and pain.   ACTIVITY LIMITATIONS: carrying, lifting, bending, standing, squatting, stairs, transfers, bed mobility, dressing, and locomotion level  PARTICIPATION LIMITATIONS: cleaning, community activity, occupation, and running  PERSONAL FACTORS: 1 comorbidity: cervical fusion  are also affecting patient's functional outcome.   REHAB POTENTIAL: Good  CLINICAL DECISION MAKING: Stable/uncomplicated  EVALUATION COMPLEXITY: Low   GOALS:   SHORT TERM GOALS:  Pt will be independent and compliant with HEP for improved pain, ROM, strength, and function.  Baseline: Goal status: MET 4/16 Target date: 07/05/2023  2.  Pt will progress with PROM per protocol without adverse effects for improved stiffness and mobility.  Baseline:  Goal status: MET 4/16 Target date: 07/05/2023  3.  Pt will wean off of crutches without adverse effects  Baseline:  Goal status: MET 4/16 Target date:  07/05/2023  4.  Pt will progress with exercises per protocol without adverse effects for improved strength and mobility.  Baseline:  Goal status: IN PROGRESS 4/16 Target date:  07/19/2023   5.  Pt will perform a 6 inch step up with good form and good stability.   Baseline:  Goal status: MET 4/17=6 Target date:  08/02/23   6.  Pt will ambulate with a normalized heel to toe gait pattern without limping.  Baseline:  Goal status: IN PROGRESS (limps after sitting for prolonged period) 4/16 Target date: 08/16/23   7.  Pt will have no pain and demo good form with squatting for improved function strength and to assist with functional mobility.  Baseline:  Goal status: IN PROGRESS 4/16  Target date:  08/23/2023     LONG TERM GOALS: Target  date: 09/26/2023   Pt's L hip AROM will be [facility] for improved stiffness and daily mobility  Baseline:  Goal status: MET 4/16  2.  Pt  will ambulate extended community distance without increased pain and significant difficulty.  Baseline:  Goal status: MET 4/16  3.  Pt will be able to perform all of his ADLs/IADLs and functional mobility skills without significant pain and limitation.  Baseline:  Goal status: IN PROGRESS 4/16  4.   Pt will be able to ascend and descend stairs with a reciprocal gait with good control without increased L hip pain.  Baseline:  Goal status: MET 4/16  5.  Pt will demo 4+/5 strength in L hip abd and ext, 4 to 4+/5 in L hip flex, and 5/5 in L knee ext for improved performance of and tolerance with functional mobility.  Baseline:  Goal status: IN PROGRESS 4/16    PLAN:  PT FREQUENCY:  2x/wk   PT DURATION: other: 8-9 weeks  PLANNED INTERVENTIONS: 97164- PT Re-evaluation, 97110-Therapeutic exercises, 97530- Therapeutic activity, 97112- Neuromuscular re-education, 97535- Self Care, 16109- Manual therapy, [phone]- Gait training, [phone]- Aquatic Therapy, [phone]- Electrical stimulation (unattended), [phone]- Electrical stimulation (manual), [id number]- Ultrasound, Patient/Family education, Balance training, Stair training, Taping, Dry Needling, Joint mobilization, Scar mobilization, Cryotherapy, and Moist heat  PLAN FOR NEXT SESSION: Cont per Dr. Verline Glow labral repair protocol.    Herb Loges, PTA  08/05/2023 8:55 AM

## 2023-08-09 ENCOUNTER — Encounter (HOSPITAL_BASED_OUTPATIENT_CLINIC_OR_DEPARTMENT_OTHER): Payer: Self-pay | Admitting: Physical Therapy

## 2023-08-09 ENCOUNTER — Ambulatory Visit (HOSPITAL_BASED_OUTPATIENT_CLINIC_OR_DEPARTMENT_OTHER): Admitting: Physical Therapy

## 2023-08-09 DIAGNOSIS — M25552 Pain in left hip: Secondary | ICD-10-CM | POA: Diagnosis not present

## 2023-08-09 DIAGNOSIS — M6281 Muscle weakness (generalized): Secondary | ICD-10-CM | POA: Diagnosis not present

## 2023-08-09 DIAGNOSIS — R262 Difficulty in walking, not elsewhere classified: Secondary | ICD-10-CM | POA: Diagnosis not present

## 2023-08-09 DIAGNOSIS — M25652 Stiffness of left hip, not elsewhere classified: Secondary | ICD-10-CM | POA: Diagnosis not present

## 2023-08-09 NOTE — Therapy (Signed)
 OUTPATIENT PHYSICAL THERAPY TREATMENT Progress Note Reporting Period 06/07/2023 to 08/30/2023  See note below for Objective Data and Assessment of Progress/Goals.       Patient Name: Johnny Boyer MRN: [id number] DOB:December 07, 1955, 68 y.o., male Today's Date: 08/09/2023  END OF SESSION:  PT End of Session - 07/31/23 1605     Visit Number 10    Number of Visits 28    Date for PT Re-Evaluation 09/26/23    Authorization Type [employer]ET[employer] MCR    Progress Note Due on Visit 10    PT Start Time 1603    PT Stop Time 1648    PT Time Calculation (min) 45 min    Activity Tolerance Patient tolerated treatment well    Behavior During Therapy [facility] for tasks assessed/performed                      Past Medical History:  Diagnosis Date   Hemorrhoids    Hyperlipidemia    Myocardial infarction (HCC) 2009   Sleep apnea    no cpap used did not tolerate, moderate osa   Past Surgical History:  Procedure Laterality Date   CARDIAC CATHETERIZATION  2009   stents to mid rca x 2   EVALUATION UNDER ANESTHESIA WITH HEMORRHOIDECTOMY N/A 04/21/2020   Procedure: HEMORRHOIDECTOMY X3 WITH LIGATION AND HEMORRHOIDOPEXY ANORECTAL EXAMINATION UNDER ANESTHESIA;  Surgeon: Candyce Champagne, MD;  Location: [facility];  Service: General;  Laterality: N/A;   FRACTURE SURGERY  1974   right wrist   SPINE SURGERY  2008 and 2009   cervical C 3 C4 C5 fusion   Patient Active Problem List   Diagnosis Date Noted   History of coronary artery disease 12/15/2019   External hemorrhoids 12/15/2019   Dyslipidemia 12/15/2019     REFERRING PROVIDER: Wilhelmenia Harada, MD  REFERRING DIAG: [phone] (ICD-10-CM) - Hip impingement syndrome, left s/p Left hip pincer debridement with labral repair and CAM debridement  THERAPY DIAG:  Stiffness of left hip, not elsewhere classified  Pain in left hip  Difficulty in walking, not elsewhere classified  Muscle weakness (generalized)  Rationale for Evaluation  and Treatment: Rehabilitation  ONSET DATE: DOS  06/03/2022   SUBJECTIVE:   SUBJECTIVE STATEMENT: 08/09/2023 Pt reports he strained his back on sit to stand the other day. Other than that he says he is doing okay. Is back to running.    Per eval - Pt reports his L hip pain began last year with no specific MOI.  Pt states his pain progressively worsened.  Pt received PT from September 2024 - January 2025 though had no improvement in pain.  Pt underwent Left hip pincer debridement with labral repair and CAM debridement on 2/18.  Post-op plan note indicated Weight bearing as tolerated and formal physical therapy to begin this week. Pt is limited with his normal functional mobility skills including ambulation and transfers.  He is using his hands to lift leg to get into bed and has difficulty with shower transfers.  Pt has difficulty with dressing and his wife ties his shoes.  Pt unable to run.  Pt is not working since surgery, but plans to work from home on Monday.    PERTINENT HISTORY: Left hip pincer debridement with labral repair and CAM debridement on 2/18 MI 2009 with stent placement Cervical fusion C3-5, 2008 and 2009 Pt states he has bulging discs, L4-5 L knee arthroscopic surgery  PAIN:  NPRS:  none currently, 4/10 at most with prolonged  standing (>3-4 hours) Location:  lateral L hip, at incision  PRECAUTIONS: Other: labral repair protocol, cervical fusion  RED FLAGS: None   WEIGHT BEARING RESTRICTIONS: Yes WBAT  FALLS:  Has patient fallen in last 6 months? No  LIVING ENVIRONMENT: Lives with: lives with their spouse Lives in: 1 story apartment Stairs: no Has following equipment at home: bilat crutches, shower chair  OCCUPATION: pt works for a [employer].  50% on feet/50% sitting.  PLOF: Independent  Pt was running 4 days per week, 3 miles per day.    PATIENT GOALS: get back to running, to be able to perform the normal daily activities including tying shoes,  reduce pain.  NEXT MD VISIT: 07/19/23  OBJECTIVE:  Note: Objective measures were completed at Evaluation unless otherwise noted.  LOWER EXTREMITY ROM:     Active  Right 4/16 Left 4/16  Hip flexion 105deg 103deg  Hip extension    Hip abduction 25deg 25deg  Hip adduction    Hip internal rotation 30deg 30deg  Hip external rotation 30deg 38deg  Knee flexion    Knee extension    Ankle dorsiflexion    Ankle plantarflexion    Ankle inversion    Ankle eversion     (Blank rows = not tested)    LOWER EXTREMITY MMT:    MMT Right 4/16 Left 4/16  Hip flexion    Hip extension 79.8 78.5  Hip abduction 56.5 48.2  Hip adduction    Hip internal rotation 43.5 38.0  Hip external rotation 43.8 41.9  Knee flexion    Knee extension 60.0 57.8  Ankle dorsiflexion    Ankle plantarflexion    Ankle inversion    Ankle eversion     (Blank rows = not tested)   DIAGNOSTIC FINDINGS: Pt had a MRI in July 2024 and is post op currently.   PATIENT SURVEYS:  LEFS 22/80 4/16: 67/80  COGNITION: Overall cognitive status: Within functional limits for tasks assessed                                                                                                                                    TREATMENT DATE:   4/25 Manual: All PROM performed with distraction to reduce pain and improve movement Hip inf joint mob Hip PA in 60deg flex STM trigger point therapy to lumbar paraspinals and anterior hip  Hip Posterior and inferior glides Grade II and III    There-ex: Quadruped cat/cow 5" hold x10 Standing hip 3 way RTB at ankles 2x10 ea bilateral Squats x10 Cable walks 25lb retro x8, 20lb fwd x8 There-Act  Neuro-Re-ed  Diver exercise 3x10 each leg  Treatment                            08/05/23:  Seated lumbar flexion stretch DKTC 15sec x4 PPT 5" x10 LTR 5"  x10ea Manual HSS bil Manual hip ER/IR stretching Quadruped cat/cow 5" hold x10 Child's pose 15" x4 Cable  walks 25lb retro x8, 20lb fwd x8 Standing hip 3 way RTB at ankles 2x10 ea bilateral Squats at back of bike 2x10 Supine SLR with TA 2x10   Treatment                            07/31/23:  Elliptical x70min L3 Standing hip 3 way RTB at ankles 2x15 ea bilateral Updated ROM and MMT Updated HEP  Stairs x1 flight reciprocal Squats x10 Staggered sit to stands L posterior x10 Squat taps to plinth x10   [facility] Adult PT Treatment:                                                DATE: 07/18/23 Therapeutic Exercise: 12 min recumbent bike during subjective Prone hip extension 2x15 Modified plank (knee/elbow) 3x15 sec Black band tall kneeling row 2x10 Black band paloff tall kneeling x10 BIL HEP update + education  Therapeutic Activity: Squat tap to raised mat 1/3 x8, 1/2 x8 BW squat x8 cues for appropriate depth Stairwell fwd step up 2x8 w/ rail    [facility] Adult PT Treatment:                                                DATE: 07/12/23 Therapeutic Exercise: Recumbent bike 15 min  Prone hip ext 2x8 BIL cues for reduced truncal compensations  Tall kneeling blue band row 2x12 Tall kneeling blue band paloff x12 BIL HEP discussion/education  Therapeutic Activity: Squat tap 1/3 to raised mat 3x12 SLS weaning UE support 3x45sec     [facility] Adult PT Treatment:                                                DATE: 07/09/23 Therapeutic Exercise: Upright bike L5 x29min L hip PROM  Prone hamstring curl x12 unresisted, red band x8 BIL  Bridge w 3 sec iso hold 2x8  Row in tall kneeling; green band x30 Tall kneeling paloff press  green band x30 BIL Quadruped rocking x20 ea  Squat tap to raised surface (1/3) 2x10 SLS trials  Standing hip abduction 2.5# cuff weights 3x10ea    PATIENT EDUCATION:  Education details: rationale for interventions, HEP  Person educated: Patient Education method: Explanation, Demonstration, Tactile cues, Verbal cues Education comprehension: verbalized understanding,  returned demonstration, verbal cues required, tactile cues required, and needs further education     HOME EXERCISE PROGRAM: Access Code: [email or web address] URL: [email or web address] Date: 07/18/2023 Prepared by: Mayme Spearman  Program Notes -with row, please perform tall kneeling as done in clinic  Exercises - Supine Heel Slide  - 2 x daily - 7 x weekly - 2-3 sets - 10 reps - Supine Posterior Pelvic Tilt  - 2 x daily - 7 x weekly - 2-3 sets - 10 reps - Beginner Bridge  - 3-4 x weekly - 1-2 sets - 8-10 reps - Quadruped Rocking Slow  - 3-4 x weekly - 1-2 sets - 8-10  reps - Quadruped Pelvic Tilt  - 3-4 x weekly - 1-2 sets - 10 reps - Stride Stance Weight Shift  - 3-4 x weekly - 1-2 sets - 10 reps - Tall Kneeling Anti-Rotation Press  - 3-4 x weekly - 2 sets - 8 reps - Standing Shoulder Row with Anchored Resistance  - 1 x daily - 3-4 x weekly - 2 sets - 8 reps - Sidelying Hip Abduction  - 1-2 x daily - 7 x weekly - 3 sets - 10 reps - Prone Hip Extension  - 1-2 x daily - 7 x weekly - 2 sets - 8 reps - Plank on Knees  - 3-4 x weekly - 2-3 sets - 15-20sec hold - Mini Squat  - 3-4 x weekly - 2-3 sets - 8 reps  ASSESSMENT:  CLINICAL IMPRESSION: 08/09/2023 Pt warmed up on the upright bike for with no increase in symptoms. Manual therapy was performed noting trigger point in lumbar paraspinal on R side. Pt reported increased ROM following manual. Therapy also performed  grade II and III inferior and posterior glides to increase flexion. Improved flexion noted Stretches and exercises were performed focusing on lumbar stretching and LE strengthening for functional activities. Pt will continue to benefit from skilled physical therapy to progress tolerance for functional ADL's.    Eval: Patient is a 68 y.o. male 3 days s/p s/p Left hip pincer debridement with labral repair and CAM debridement presenting to the clinic with expected post op limitations including L hip pain, limited hip ROM,  muscle weakness, and difficulty in walking.  Pt is limited with his normal functional mobility skills including ambulation and transfers.  Pt has difficulty with dressing.  Pt is an avid runner and is unable to run due to surgery.  He is not performing his normal work activities currently.  Pt should benefit from skilled PT per protocol to address impairments and improve overall function.      OBJECTIVE IMPAIRMENTS: Abnormal gait, decreased activity tolerance, decreased mobility, difficulty walking, decreased ROM, decreased strength, hypomobility, impaired flexibility, and pain.   ACTIVITY LIMITATIONS: carrying, lifting, bending, standing, squatting, stairs, transfers, bed mobility, dressing, and locomotion level  PARTICIPATION LIMITATIONS: cleaning, community activity, occupation, and running  PERSONAL FACTORS: 1 comorbidity: cervical fusion  are also affecting patient's functional outcome.   REHAB POTENTIAL: Good  CLINICAL DECISION MAKING: Stable/uncomplicated  EVALUATION COMPLEXITY: Low   GOALS:   SHORT TERM GOALS:  Pt will be independent and compliant with HEP for improved pain, ROM, strength, and function.  Baseline: Goal status: MET 4/16 Target date: 07/05/2023  2.  Pt will progress with PROM per protocol without adverse effects for improved stiffness and mobility.  Baseline:  Goal status: MET 4/16 Target date: 07/05/2023  3.  Pt will wean off of crutches without adverse effects  Baseline:  Goal status: MET 4/16 Target date:  07/05/2023  4.  Pt will progress with exercises per protocol without adverse effects for improved strength and mobility.  Baseline:  Goal status: IN PROGRESS 4/16 Target date:  07/19/2023   5.  Pt will perform a 6 inch step up with good form and good stability.   Baseline:  Goal status: MET 4/17=6 Target date:  08/02/23   6.  Pt will ambulate with a normalized heel to toe gait pattern without limping.  Baseline:  Goal status: IN PROGRESS (limps  after sitting for prolonged period) 4/16 Target date: 08/16/23   7.  Pt will have no pain and demo  good form with squatting for improved function strength and to assist with functional mobility.  Baseline:  Goal status: IN PROGRESS 4/16  Target date:  08/23/2023     LONG TERM GOALS: Target date: 09/26/2023   Pt's L hip AROM will be [facility] for improved stiffness and daily mobility  Baseline:  Goal status: MET 4/16  2.  Pt will ambulate extended community distance without increased pain and significant difficulty.  Baseline:  Goal status: MET 4/16  3.  Pt will be able to perform all of his ADLs/IADLs and functional mobility skills without significant pain and limitation.  Baseline:  Goal status: IN PROGRESS 4/16  4.   Pt will be able to ascend and descend stairs with a reciprocal gait with good control without increased L hip pain.  Baseline:  Goal status: MET 4/16  5.  Pt will demo 4+/5 strength in L hip abd and ext, 4 to 4+/5 in L hip flex, and 5/5 in L knee ext for improved performance of and tolerance with functional mobility.  Baseline:  Goal status: IN PROGRESS 4/16    PLAN:  PT FREQUENCY:  2x/wk   PT DURATION: other: 8-9 weeks  PLANNED INTERVENTIONS: 97164- PT Re-evaluation, 97110-Therapeutic exercises, 97530- Therapeutic activity, 97112- Neuromuscular re-education, 97535- Self Care, 87564- Manual therapy, [phone]- Gait training, [phone]- Aquatic Therapy, [phone]- Electrical stimulation (unattended), [phone]- Electrical stimulation (manual), [id number]- Ultrasound, Patient/Family education, Balance training, Stair training, Taping, Dry Needling, Joint mobilization, Scar mobilization, Cryotherapy, and Moist heat  PLAN FOR NEXT SESSION: Cont per Dr. Verline Glow labral repair protocol.    Herminia Lope Kaitlan Bin SPT 08/09/2023 10:47 AM

## 2023-08-12 ENCOUNTER — Ambulatory Visit (HOSPITAL_BASED_OUTPATIENT_CLINIC_OR_DEPARTMENT_OTHER)

## 2023-08-12 ENCOUNTER — Encounter (HOSPITAL_BASED_OUTPATIENT_CLINIC_OR_DEPARTMENT_OTHER): Payer: Self-pay

## 2023-08-12 DIAGNOSIS — M6281 Muscle weakness (generalized): Secondary | ICD-10-CM | POA: Diagnosis not present

## 2023-08-12 DIAGNOSIS — M25552 Pain in left hip: Secondary | ICD-10-CM

## 2023-08-12 DIAGNOSIS — M25652 Stiffness of left hip, not elsewhere classified: Secondary | ICD-10-CM | POA: Diagnosis not present

## 2023-08-12 DIAGNOSIS — R262 Difficulty in walking, not elsewhere classified: Secondary | ICD-10-CM

## 2023-08-12 NOTE — Therapy (Signed)
 OUTPATIENT PHYSICAL THERAPY TREATMENT       Patient Name: Johnny Boyer MRN: [id number] DOB:Nov 24, 1955, 68 y.o., male Today's Date: 08/12/2023  END OF SESSION:  PT End of Session - 07/31/23 1605     Visit Number 10    Number of Visits 28    Date for PT Re-Evaluation 09/26/23    Authorization Type [employer]ET[employer] MCR    Progress Note Due on Visit 10    PT Start Time 1603    PT Stop Time 1648    PT Time Calculation (min) 45 min    Activity Tolerance Patient tolerated treatment well    Behavior During Therapy [facility] for tasks assessed/performed                      Past Medical History:  Diagnosis Date   Hemorrhoids    Hyperlipidemia    Myocardial infarction (HCC) 2009   Sleep apnea    no cpap used did not tolerate, moderate osa   Past Surgical History:  Procedure Laterality Date   CARDIAC CATHETERIZATION  2009   stents to mid rca x 2   EVALUATION UNDER ANESTHESIA WITH HEMORRHOIDECTOMY N/A 04/21/2020   Procedure: HEMORRHOIDECTOMY X3 WITH LIGATION AND HEMORRHOIDOPEXY ANORECTAL EXAMINATION UNDER ANESTHESIA;  Surgeon: Candyce Champagne, MD;  Location: [facility];  Service: General;  Laterality: N/A;   FRACTURE SURGERY  1974   right wrist   SPINE SURGERY  2008 and 2009   cervical C 3 C4 C5 fusion   Patient Active Problem List   Diagnosis Date Noted   History of coronary artery disease 12/15/2019   External hemorrhoids 12/15/2019   Dyslipidemia 12/15/2019     REFERRING PROVIDER: Wilhelmenia Harada, MD  REFERRING DIAG: [id number] (ICD-10-CM) - Hip impingement syndrome, left s/p Left hip pincer debridement with labral repair and CAM debridement  THERAPY DIAG:  Stiffness of left hip, not elsewhere classified  Difficulty in walking, not elsewhere classified  Pain in left hip  Muscle weakness (generalized)  Rationale for Evaluation and Treatment: Rehabilitation  ONSET DATE: DOS  06/03/2022   SUBJECTIVE:   SUBJECTIVE STATEMENT: 08/12/2023 Pt reports he  was able to increase his running distance to 3 miles (run/walk).    Per eval - Pt reports his L hip pain began last year with no specific MOI.  Pt states his pain progressively worsened.  Pt received PT from September 2024 - January 2025 though had no improvement in pain.  Pt underwent Left hip pincer debridement with labral repair and CAM debridement on 2/18.  Post-op plan note indicated Weight bearing as tolerated and formal physical therapy to begin this week. Pt is limited with his normal functional mobility skills including ambulation and transfers.  He is using his hands to lift leg to get into bed and has difficulty with shower transfers.  Pt has difficulty with dressing and his wife ties his shoes.  Pt unable to run.  Pt is not working since surgery, but plans to work from home on Monday.    PERTINENT HISTORY: Left hip pincer debridement with labral repair and CAM debridement on 2/18 MI 2009 with stent placement Cervical fusion C3-5, 2008 and 2009 Pt states he has bulging discs, L4-5 L knee arthroscopic surgery  PAIN:  NPRS:  none currently, 4/10 at most with prolonged standing (>3-4 hours) Location:  lateral L hip, at incision  PRECAUTIONS: Other: labral repair protocol, cervical fusion  RED FLAGS: None   WEIGHT BEARING RESTRICTIONS: Yes WBAT  FALLS:  Has patient fallen in last 6 months? No  LIVING ENVIRONMENT: Lives with: lives with their spouse Lives in: 1 story apartment Stairs: no Has following equipment at home: bilat crutches, shower chair  OCCUPATION: pt works for a [employer].  50% on feet/50% sitting.  PLOF: Independent  Pt was running 4 days per week, 3 miles per day.    PATIENT GOALS: get back to running, to be able to perform the normal daily activities including tying shoes, reduce pain.  NEXT MD VISIT: 07/19/23  OBJECTIVE:  Note: Objective measures were completed at Evaluation unless otherwise noted.  LOWER EXTREMITY ROM:     Active   Right 4/16 Left 4/16  Hip flexion 105deg 103deg  Hip extension    Hip abduction 25deg 25deg  Hip adduction    Hip internal rotation 30deg 30deg  Hip external rotation 30deg 38deg  Knee flexion    Knee extension    Ankle dorsiflexion    Ankle plantarflexion    Ankle inversion    Ankle eversion     (Blank rows = not tested)    LOWER EXTREMITY MMT:    MMT Right 4/16 Left 4/16  Hip flexion    Hip extension 79.8 78.5  Hip abduction 56.5 48.2  Hip adduction    Hip internal rotation 43.5 38.0  Hip external rotation 43.8 41.9  Knee flexion    Knee extension 60.0 57.8  Ankle dorsiflexion    Ankle plantarflexion    Ankle inversion    Ankle eversion     (Blank rows = not tested)   DIAGNOSTIC FINDINGS: Pt had a MRI in July 2024 and is post op currently.   PATIENT SURVEYS:  LEFS 22/80 4/16: 67/80  COGNITION: Overall cognitive status: Within functional limits for tasks assessed                                                                                                                                    TREATMENT DATE:    4/28 Manual:   L hip PROM LAD L LE Posterior hip mobilizations Passive piriformis stretching STM to lateral glutes and piriformis in s/l   There-ex:  Child's pose stretch S/l hip abduction 3x10L Standing hip 3 way RTB at ankles 3x10 ea bilateral Retro lunges 2x5ea Diver exercise x10 each leg       4/25 Manual: All PROM performed with distraction to reduce pain and improve movement Hip inf joint mob Hip PA in 60deg flex STM trigger point therapy to lumbar paraspinals and anterior hip  Hip Posterior and inferior glides Grade II and III    There-ex: Quadruped cat/cow 5" hold x10 Standing hip 3 way RTB at ankles 2x10 ea bilateral Squats x10 Cable walks 25lb retro x8, 20lb fwd x8 There-Act  Neuro-Re-ed  Diver exercise 3x10 each leg  Treatment  08/05/23:  Seated lumbar flexion  stretch DKTC 15sec x4 PPT 5" x10 LTR 5" x10ea Manual HSS bil Manual hip ER/IR stretching Quadruped cat/cow 5" hold x10 Child's pose 15" x4 Cable walks 25lb retro x8, 20lb fwd x8 Standing hip 3 way RTB at ankles 2x10 ea bilateral Squats at back of bike 2x10 Supine SLR with TA 2x10   Treatment                            07/31/23:  Elliptical x61min L3 Standing hip 3 way RTB at ankles 2x15 ea bilateral Updated ROM and MMT Updated HEP  Stairs x1 flight reciprocal Squats x10 Staggered sit to stands L posterior x10 Squat taps to plinth x10   [facility] Adult PT Treatment:                                                DATE: 07/18/23 Therapeutic Exercise: 12 min recumbent bike during subjective Prone hip extension 2x15 Modified plank (knee/elbow) 3x15 sec Black band tall kneeling row 2x10 Black band paloff tall kneeling x10 BIL HEP update + education  Therapeutic Activity: Squat tap to raised mat 1/3 x8, 1/2 x8 BW squat x8 cues for appropriate depth Stairwell fwd step up 2x8 w/ rail    [facility] Adult PT Treatment:                                                DATE: 07/12/23 Therapeutic Exercise: Recumbent bike 15 min  Prone hip ext 2x8 BIL cues for reduced truncal compensations  Tall kneeling blue band row 2x12 Tall kneeling blue band paloff x12 BIL HEP discussion/education  Therapeutic Activity: Squat tap 1/3 to raised mat 3x12 SLS weaning UE support 3x45sec     [facility] Adult PT Treatment:                                                DATE: 07/09/23 Therapeutic Exercise: Upright bike L5 x15min L hip PROM  Prone hamstring curl x12 unresisted, red band x8 BIL  Bridge w 3 sec iso hold 2x8  Row in tall kneeling; green band x30 Tall kneeling paloff press  green band x30 BIL Quadruped rocking x20 ea  Squat tap to raised surface (1/3) 2x10 SLS trials  Standing hip abduction 2.5# cuff weights 3x10ea    PATIENT EDUCATION:  Education details: rationale for interventions, HEP   Person educated: Patient Education method: Explanation, Demonstration, Tactile cues, Verbal cues Education comprehension: verbalized understanding, returned demonstration, verbal cues required, tactile cues required, and needs further education     HOME EXERCISE PROGRAM: Access Code: [email or web address] URL: [email or web address] Date: 07/18/2023 Prepared by: Mayme Spearman  Program Notes -with row, please perform tall kneeling as done in clinic  Exercises - Supine Heel Slide  - 2 x daily - 7 x weekly - 2-3 sets - 10 reps - Supine Posterior Pelvic Tilt  - 2 x daily - 7 x weekly - 2-3 sets - 10 reps - Beginner Bridge  - 3-4 x weekly - 1-2 sets - 8-10 reps -  Quadruped Rocking Slow  - 3-4 x weekly - 1-2 sets - 8-10 reps - Quadruped Pelvic Tilt  - 3-4 x weekly - 1-2 sets - 10 reps - Stride Stance Weight Shift  - 3-4 x weekly - 1-2 sets - 10 reps - Tall Kneeling Anti-Rotation Press  - 3-4 x weekly - 2 sets - 8 reps - Standing Shoulder Row with Anchored Resistance  - 1 x daily - 3-4 x weekly - 2 sets - 8 reps - Sidelying Hip Abduction  - 1-2 x daily - 7 x weekly - 3 sets - 10 reps - Prone Hip Extension  - 1-2 x daily - 7 x weekly - 2 sets - 8 reps - Plank on Knees  - 3-4 x weekly - 2-3 sets - 15-20sec hold - Mini Squat  - 3-4 x weekly - 2-3 sets - 8 reps  ASSESSMENT:  CLINICAL IMPRESSION: 08/12/2023 Pt demonstrated limitations into cross body hip stretching and fig 4 ER in beginning of session, which was significantly improved following STM and manual stretching to piriformis and lateral hip.Challenged by s/l hip abduction on L LE following MT.  Notably weaker in L hip with retro lunges at rail compared to R hip. Pt advised to use massage ball over piriformis at home prior to and following running.  Will discuss POC with pt at next visit.    Eval: Patient is a 68 y.o. male 3 days s/p s/p Left hip pincer debridement with labral repair and CAM debridement presenting to the clinic with  expected post op limitations including L hip pain, limited hip ROM, muscle weakness, and difficulty in walking.  Pt is limited with his normal functional mobility skills including ambulation and transfers.  Pt has difficulty with dressing.  Pt is an avid runner and is unable to run due to surgery.  He is not performing his normal work activities currently.  Pt should benefit from skilled PT per protocol to address impairments and improve overall function.      OBJECTIVE IMPAIRMENTS: Abnormal gait, decreased activity tolerance, decreased mobility, difficulty walking, decreased ROM, decreased strength, hypomobility, impaired flexibility, and pain.   ACTIVITY LIMITATIONS: carrying, lifting, bending, standing, squatting, stairs, transfers, bed mobility, dressing, and locomotion level  PARTICIPATION LIMITATIONS: cleaning, community activity, occupation, and running  PERSONAL FACTORS: 1 comorbidity: cervical fusion  are also affecting patient's functional outcome.   REHAB POTENTIAL: Good  CLINICAL DECISION MAKING: Stable/uncomplicated  EVALUATION COMPLEXITY: Low   GOALS:   SHORT TERM GOALS:  Pt will be independent and compliant with HEP for improved pain, ROM, strength, and function.  Baseline: Goal status: MET 4/16 Target date: 07/05/2023  2.  Pt will progress with PROM per protocol without adverse effects for improved stiffness and mobility.  Baseline:  Goal status: MET 4/16 Target date: 07/05/2023  3.  Pt will wean off of crutches without adverse effects  Baseline:  Goal status: MET 4/16 Target date:  07/05/2023  4.  Pt will progress with exercises per protocol without adverse effects for improved strength and mobility.  Baseline:  Goal status: IN PROGRESS 4/16 Target date:  07/19/2023   5.  Pt will perform a 6 inch step up with good form and good stability.   Baseline:  Goal status: MET 4/17=6 Target date:  08/02/23   6.  Pt will ambulate with a normalized heel to toe gait  pattern without limping.  Baseline:  Goal status: IN PROGRESS (limps after sitting for prolonged period) 4/16 Target date: 08/16/23  7.  Pt will have no pain and demo good form with squatting for improved function strength and to assist with functional mobility.  Baseline:  Goal status: IN PROGRESS 4/16  Target date:  08/23/2023     LONG TERM GOALS: Target date: 09/26/2023   Pt's L hip AROM will be [facility] for improved stiffness and daily mobility  Baseline:  Goal status: MET 4/16  2.  Pt will ambulate extended community distance without increased pain and significant difficulty.  Baseline:  Goal status: MET 4/16  3.  Pt will be able to perform all of his ADLs/IADLs and functional mobility skills without significant pain and limitation.  Baseline:  Goal status: IN PROGRESS 4/16  4.   Pt will be able to ascend and descend stairs with a reciprocal gait with good control without increased L hip pain.  Baseline:  Goal status: MET 4/16  5.  Pt will demo 4+/5 strength in L hip abd and ext, 4 to 4+/5 in L hip flex, and 5/5 in L knee ext for improved performance of and tolerance with functional mobility.  Baseline:  Goal status: IN PROGRESS 4/16    PLAN:  PT FREQUENCY:  2x/wk   PT DURATION: other: 8-9 weeks  PLANNED INTERVENTIONS: 97164- PT Re-evaluation, 97110-Therapeutic exercises, 97530- Therapeutic activity, 97112- Neuromuscular re-education, 97535- Self Care, 16109- Manual therapy, [phone]- Gait training, [phone]- Aquatic Therapy, [phone]- Electrical stimulation (unattended), [phone]- Electrical stimulation (manual), [id number]- Ultrasound, Patient/Family education, Balance training, Stair training, Taping, Dry Needling, Joint mobilization, Scar mobilization, Cryotherapy, and Moist heat  PLAN FOR NEXT SESSION: Cont per Dr. Verline Glow labral repair protocol.    Herb Loges, PTA  08/12/2023 9:23 AM

## 2023-08-16 ENCOUNTER — Encounter (HOSPITAL_BASED_OUTPATIENT_CLINIC_OR_DEPARTMENT_OTHER): Payer: Self-pay | Admitting: Physical Therapy

## 2023-08-16 ENCOUNTER — Ambulatory Visit (HOSPITAL_BASED_OUTPATIENT_CLINIC_OR_DEPARTMENT_OTHER): Attending: Orthopaedic Surgery | Admitting: Physical Therapy

## 2023-08-16 DIAGNOSIS — M25652 Stiffness of left hip, not elsewhere classified: Secondary | ICD-10-CM | POA: Diagnosis present

## 2023-08-16 DIAGNOSIS — M6281 Muscle weakness (generalized): Secondary | ICD-10-CM | POA: Diagnosis present

## 2023-08-16 DIAGNOSIS — M25552 Pain in left hip: Secondary | ICD-10-CM | POA: Insufficient documentation

## 2023-08-16 DIAGNOSIS — R262 Difficulty in walking, not elsewhere classified: Secondary | ICD-10-CM | POA: Insufficient documentation

## 2023-08-16 NOTE — Therapy (Deleted)
 OUTPATIENT PHYSICAL THERAPY TREATMENT       Patient Name: Johnny Boyer MRN: [id number] DOB:Jul 11, 1955, 68 y.o., male Today's Date: 08/16/2023  END OF SESSION:  PT End of Session - 07/31/23 1605     Visit Number 10    Number of Visits 28    Date for PT Re-Evaluation 09/26/23    Authorization Type [employer]ET[employer] MCR    Progress Note Due on Visit 10    PT Start Time 1603    PT Stop Time 1648    PT Time Calculation (min) 45 min    Activity Tolerance Patient tolerated treatment well    Behavior During Therapy [facility] for tasks assessed/performed                      Past Medical History:  Diagnosis Date   Hemorrhoids    Hyperlipidemia    Myocardial infarction (HCC) 2009   Sleep apnea    no cpap used did not tolerate, moderate osa   Past Surgical History:  Procedure Laterality Date   CARDIAC CATHETERIZATION  2009   stents to mid rca x 2   EVALUATION UNDER ANESTHESIA WITH HEMORRHOIDECTOMY N/A 04/21/2020   Procedure: HEMORRHOIDECTOMY X3 WITH LIGATION AND HEMORRHOIDOPEXY ANORECTAL EXAMINATION UNDER ANESTHESIA;  Surgeon: Candyce Champagne, MD;  Location: [facility] [place];  Service: General;  Laterality: N/A;   FRACTURE SURGERY  1974   right wrist   SPINE SURGERY  2008 and 2009   cervical C 3 C4 C5 fusion   Patient Active Problem List   Diagnosis Date Noted   History of coronary artery disease 12/15/2019   External hemorrhoids 12/15/2019   Dyslipidemia 12/15/2019     REFERRING PROVIDER: Wilhelmenia Harada, MD  REFERRING DIAG: M25.852 (ICD-10-CM) - Hip impingement syndrome, left s/p Left hip pincer debridement with labral repair and CAM debridement  THERAPY DIAG:  No diagnosis found.  Rationale for Evaluation and Treatment: Rehabilitation  ONSET DATE: DOS  06/03/2022   SUBJECTIVE:   SUBJECTIVE STATEMENT: 5/2 Pt reports the hip is still the same. Feels the pain when he is sitting down more in the back than in the hip.    Per eval - Pt reports his L hip pain  began last year with no specific MOI.  Pt states his pain progressively worsened.  Pt received PT from September 2024 - January 2025 though had no improvement in pain.  Pt underwent Left hip pincer debridement with labral repair and CAM debridement on 2/18.  Post-op plan note indicated Weight bearing as tolerated and formal physical therapy to begin this week. Pt is limited with his normal functional mobility skills including ambulation and transfers.  He is using his hands to lift leg to get into bed and has difficulty with shower transfers.  Pt has difficulty with dressing and his wife ties his shoes.  Pt unable to run.  Pt is not working since surgery, but plans to work from home on Monday.    PERTINENT HISTORY: Left hip pincer debridement with labral repair and CAM debridement on 2/18 MI 2009 with stent placement Cervical fusion C3-5, 2008 and 2009 Pt states he has bulging discs, L4-5 L knee arthroscopic surgery  PAIN:  NPRS:  none currently, 4/10 at most with prolonged standing (>3-4 hours) Location:  lateral L hip, at incision  PRECAUTIONS: Other: labral repair protocol, cervical fusion  RED FLAGS: None   WEIGHT BEARING RESTRICTIONS: Yes WBAT  FALLS:  Has patient fallen in last 6 months? No  LIVING ENVIRONMENT: Lives with: lives with their spouse Lives in: 1 story apartment Stairs: no Has following equipment at home: bilat crutches, shower chair  OCCUPATION: pt works for a [employer].  50% on feet/50% sitting.  PLOF: Independent  Pt was running 4 days per week, 3 miles per day.    PATIENT GOALS: get back to running, to be able to perform the normal daily activities including tying shoes, reduce pain.  NEXT MD VISIT: 07/19/23  OBJECTIVE:  Note: Objective measures were completed at Evaluation unless otherwise noted.  LOWER EXTREMITY ROM:     Active  Right 4/16 Left 4/16  Hip flexion 105deg 103deg  Hip extension    Hip abduction 25deg 25deg  Hip adduction     Hip internal rotation 30deg 30deg  Hip external rotation 30deg 38deg  Knee flexion    Knee extension    Ankle dorsiflexion    Ankle plantarflexion    Ankle inversion    Ankle eversion     (Blank rows = not tested)    LOWER EXTREMITY MMT:    MMT Right 4/16 Left 4/16  Hip flexion    Hip extension 79.8 78.5  Hip abduction 56.5 48.2  Hip adduction    Hip internal rotation 43.5 38.0  Hip external rotation 43.8 41.9  Knee flexion    Knee extension 60.0 57.8  Ankle dorsiflexion    Ankle plantarflexion    Ankle inversion    Ankle eversion     (Blank rows = not tested)   DIAGNOSTIC FINDINGS: Pt had a MRI in July 2024 and is post op currently.   PATIENT SURVEYS:  LEFS 22/80 4/16: 67/80  COGNITION: Overall cognitive status: Within functional limits for tasks assessed                                                                                                                                    TREATMENT DATE:   5/2 Manual: All PROM performed with distraction to reduce pain and improve movement L hip PROM LAD L LE Posterior hip mobilizations Passive piriformis stretching STM to lateral glutes and piriformis in s/l  There-ex: Standing hip 3 way RTB at ankles 3x10 ea bilateral There-Act  Self Care  Neuro-Re-ed  4/28 Manual:   L hip PROM LAD L LE Posterior hip mobilizations Passive piriformis stretching STM to lateral glutes and piriformis in s/l   There-ex:  Child's pose stretch S/l hip abduction 3x10L Standing hip 3 way RTB at ankles 3x10 ea bilateral Retro lunges 2x5ea Diver exercise x10 each leg       4/25 Manual: All PROM performed with distraction to reduce pain and improve movement Hip inf joint mob Hip PA in 60deg flex STM trigger point therapy to lumbar paraspinals and anterior hip  Hip Posterior and inferior glides Grade II and III    There-ex: Quadruped cat/cow 5" hold x10 Standing hip 3  way RTB at ankles 2x10  ea bilateral Squats x10 Cable walks 25lb retro x8, 20lb fwd x8 There-Act  Neuro-Re-ed  Diver exercise 3x10 each leg  Treatment                            08/05/23:  Seated lumbar flexion stretch DKTC 15sec x4 PPT 5" x10 LTR 5" x10ea Manual HSS bil Manual hip ER/IR stretching Quadruped cat/cow 5" hold x10 Child's pose 15" x4 Cable walks 25lb retro x8, 20lb fwd x8 Standing hip 3 way RTB at ankles 2x10 ea bilateral Squats at back of bike 2x10 Supine SLR with TA 2x10   Treatment                            07/31/23:  Elliptical x55min L3 Standing hip 3 way RTB at ankles 2x15 ea bilateral Updated ROM and MMT Updated HEP  Stairs x1 flight reciprocal Squats x10 Staggered sit to stands L posterior x10 Squat taps to plinth x10   [facility] Adult PT Treatment:                                                DATE: 07/18/23 Therapeutic Exercise: 12 min recumbent bike during subjective Prone hip extension 2x15 Modified plank (knee/elbow) 3x15 sec Black band tall kneeling row 2x10 Black band paloff tall kneeling x10 BIL HEP update + education  Therapeutic Activity: Squat tap to raised mat 1/3 x8, 1/2 x8 BW squat x8 cues for appropriate depth Stairwell fwd step up 2x8 w/ rail    [facility] Adult PT Treatment:                                                DATE: 07/12/23 Therapeutic Exercise: Recumbent bike 15 min  Prone hip ext 2x8 BIL cues for reduced truncal compensations  Tall kneeling blue band row 2x12 Tall kneeling blue band paloff x12 BIL HEP discussion/education  Therapeutic Activity: Squat tap 1/3 to raised mat 3x12 SLS weaning UE support 3x45sec     [facility] Adult PT Treatment:                                                DATE: 07/09/23 Therapeutic Exercise: Upright bike L5 x42min L hip PROM  Prone hamstring curl x12 unresisted, red band x8 BIL  Bridge w 3 sec iso hold 2x8  Row in tall kneeling; green band x30 Tall kneeling paloff press  green band x30  BIL Quadruped rocking x20 ea  Squat tap to raised surface (1/3) 2x10 SLS trials  Standing hip abduction 2.5# cuff weights 3x10ea    PATIENT EDUCATION:  Education details: rationale for interventions, HEP  Person educated: Patient Education method: Explanation, Demonstration, Tactile cues, Verbal cues Education comprehension: verbalized understanding, returned demonstration, verbal cues required, tactile cues required, and needs further education     HOME EXERCISE PROGRAM: Access Code: [email or web address] URL: [email or web address] Date: 07/18/2023 Prepared by: Mayme Spearman  Program Notes -with row, please perform tall kneeling as done in  clinic  Exercises - Supine Heel Slide  - 2 x daily - 7 x weekly - 2-3 sets - 10 reps - Supine Posterior Pelvic Tilt  - 2 x daily - 7 x weekly - 2-3 sets - 10 reps - Beginner Bridge  - 3-4 x weekly - 1-2 sets - 8-10 reps - Quadruped Rocking Slow  - 3-4 x weekly - 1-2 sets - 8-10 reps - Quadruped Pelvic Tilt  - 3-4 x weekly - 1-2 sets - 10 reps - Stride Stance Weight Shift  - 3-4 x weekly - 1-2 sets - 10 reps - Tall Kneeling Anti-Rotation Press  - 3-4 x weekly - 2 sets - 8 reps - Standing Shoulder Row with Anchored Resistance  - 1 x daily - 3-4 x weekly - 2 sets - 8 reps - Sidelying Hip Abduction  - 1-2 x daily - 7 x weekly - 3 sets - 10 reps - Prone Hip Extension  - 1-2 x daily - 7 x weekly - 2 sets - 8 reps - Plank on Knees  - 3-4 x weekly - 2-3 sets - 15-20sec hold - Mini Squat  - 3-4 x weekly - 2-3 sets - 8 reps  ASSESSMENT:  CLINICAL IMPRESSION: 08/16/2023 Pt warmed up on the elliptical for with no increase in symptoms.     Eval: Patient is a 68 y.o. male 3 days s/p s/p Left hip pincer debridement with labral repair and CAM debridement presenting to the clinic with expected post op limitations including L hip pain, limited hip ROM, muscle weakness, and difficulty in walking.  Pt is limited with his normal functional mobility  skills including ambulation and transfers.  Pt has difficulty with dressing.  Pt is an avid runner and is unable to run due to surgery.  He is not performing his normal work activities currently.  Pt should benefit from skilled PT per protocol to address impairments and improve overall function.      OBJECTIVE IMPAIRMENTS: Abnormal gait, decreased activity tolerance, decreased mobility, difficulty walking, decreased ROM, decreased strength, hypomobility, impaired flexibility, and pain.   ACTIVITY LIMITATIONS: carrying, lifting, bending, standing, squatting, stairs, transfers, bed mobility, dressing, and locomotion level  PARTICIPATION LIMITATIONS: cleaning, community activity, occupation, and running  PERSONAL FACTORS: 1 comorbidity: cervical fusion  are also affecting patient's functional outcome.   REHAB POTENTIAL: Good  CLINICAL DECISION MAKING: Stable/uncomplicated  EVALUATION COMPLEXITY: Low   GOALS:   SHORT TERM GOALS:  Pt will be independent and compliant with HEP for improved pain, ROM, strength, and function.  Baseline: Goal status: MET 4/16 Target date: 07/05/2023  2.  Pt will progress with PROM per protocol without adverse effects for improved stiffness and mobility.  Baseline:  Goal status: MET 4/16 Target date: 07/05/2023  3.  Pt will wean off of crutches without adverse effects  Baseline:  Goal status: MET 4/16 Target date:  07/05/2023  4.  Pt will progress with exercises per protocol without adverse effects for improved strength and mobility.  Baseline:  Goal status: IN PROGRESS 4/16 Target date:  07/19/2023   5.  Pt will perform a 6 inch step up with good form and good stability.   Baseline:  Goal status: MET 4/17=6 Target date:  08/02/23   6.  Pt will ambulate with a normalized heel to toe gait pattern without limping.  Baseline:  Goal status: IN PROGRESS (limps after sitting for prolonged period) 4/16 Target date: 08/16/23   7.  Pt will have no pain  and  demo good form with squatting for improved function strength and to assist with functional mobility.  Baseline:  Goal status: IN PROGRESS 4/16  Target date:  08/23/2023     LONG TERM GOALS: Target date: 09/26/2023   Pt's L hip AROM will be [facility] for improved stiffness and daily mobility  Baseline:  Goal status: MET 4/16  2.  Pt will ambulate extended community distance without increased pain and significant difficulty.  Baseline:  Goal status: MET 4/16  3.  Pt will be able to perform all of his ADLs/IADLs and functional mobility skills without significant pain and limitation.  Baseline:  Goal status: IN PROGRESS 4/16  4.   Pt will be able to ascend and descend stairs with a reciprocal gait with good control without increased L hip pain.  Baseline:  Goal status: MET 4/16  5.  Pt will demo 4+/5 strength in L hip abd and ext, 4 to 4+/5 in L hip flex, and 5/5 in L knee ext for improved performance of and tolerance with functional mobility.  Baseline:  Goal status: IN PROGRESS 4/16    PLAN:  PT FREQUENCY:  2x/wk   PT DURATION: other: 8-9 weeks  PLANNED INTERVENTIONS: [id number]- PT Re-evaluation, 97110-Therapeutic exercises, 97530- Therapeutic activity, 97112- Neuromuscular re-education, 97535- Self Care, [id number]- Manual therapy, [phone]- Gait training, [phone]- Aquatic Therapy, [phone]- Electrical stimulation (unattended), [phone]- Electrical stimulation (manual), [id number]- Ultrasound, Patient/Family education, Balance training, Stair training, Taping, Dry Needling, Joint mobilization, Scar mobilization, Cryotherapy, and Moist heat  PLAN FOR NEXT SESSION: Cont per Dr. Verline Glow labral repair protocol.    Herminia Lope Stephinie Battisti SPT 08/16/2023 7:59 AM

## 2023-08-16 NOTE — Therapy (Signed)
 OUTPATIENT PHYSICAL THERAPY TREATMENT       Patient Name: Johnny Boyer MRN: [id number] DOB:March 09, 1956, 68 y.o., male Today's Date: 08/16/2023  END OF SESSION:  PT End of Session - 07/31/23 1605     Visit Number 14   Number of Visits 28    Date for PT Re-Evaluation 09/26/23    Authorization Type AET[employer] MCR    Progress Note Due on Visit 10    PT Start Time 0802   PT Stop Time  0844   PT Time Calculation (min) 42 min    Activity Tolerance Patient tolerated treatment well    Behavior During Therapy [facility] for tasks assessed/performed              Past Medical History:  Diagnosis Date   Hemorrhoids    Hyperlipidemia    Myocardial infarction (HCC) 2009   Sleep apnea    no cpap used did not tolerate, moderate osa   Past Surgical History:  Procedure Laterality Date   CARDIAC CATHETERIZATION  2009   stents to mid rca x 2   EVALUATION UNDER ANESTHESIA WITH HEMORRHOIDECTOMY N/A 04/21/2020   Procedure: HEMORRHOIDECTOMY X3 WITH LIGATION AND HEMORRHOIDOPEXY ANORECTAL EXAMINATION UNDER ANESTHESIA;  Surgeon: Candyce Champagne, MD;  Location: [facility];  Service: General;  Laterality: N/A;   FRACTURE SURGERY  1974   right wrist   SPINE SURGERY  2008 and 2009   cervical C 3 C4 C5 fusion   Patient Active Problem List   Diagnosis Date Noted   History of coronary artery disease 12/15/2019   External hemorrhoids 12/15/2019   Dyslipidemia 12/15/2019     REFERRING PROVIDER: Wilhelmenia Harada, MD  REFERRING DIAG: [phone] (ICD-10-CM) - Hip impingement syndrome, left s/p Left hip pincer debridement with labral repair and CAM debridement  THERAPY DIAG:  Stiffness of left hip, not elsewhere classified  Pain in left hip  Muscle weakness (generalized)  Difficulty in walking, not elsewhere classified  Rationale for Evaluation and Treatment: Rehabilitation  ONSET DATE: DOS  06/03/2022   SUBJECTIVE:   SUBJECTIVE STATEMENT: 08/16/2023 Pt reports hip is doing about the  same. Feels spot more in the back than in the hip. He feels like he is ready to work on his exercises on his own    Per eval - Pt reports his L hip pain began last year with no specific MOI.  Pt states his pain progressively worsened.  Pt received PT from September 2024 - January 2025 though had no improvement in pain.  Pt underwent Left hip pincer debridement with labral repair and CAM debridement on 2/18.  Post-op plan note indicated Weight bearing as tolerated and formal physical therapy to begin this week. Pt is limited with his normal functional mobility skills including ambulation and transfers.  He is using his hands to lift leg to get into bed and has difficulty with shower transfers.  Pt has difficulty with dressing and his wife ties his shoes.  Pt unable to run.  Pt is not working since surgery, but plans to work from home on Monday.    PERTINENT HISTORY: Left hip pincer debridement with labral repair and CAM debridement on 2/18 MI 2009 with stent placement Cervical fusion C3-5, 2008 and 2009 Pt states he has bulging discs, L4-5 L knee arthroscopic surgery  PAIN:  NPRS:  none currently, 4/10 at most with prolonged standing (>3-4 hours) Location:  lateral L hip, at incision  PRECAUTIONS: Other: labral repair protocol, cervical fusion  RED FLAGS: None  WEIGHT BEARING RESTRICTIONS: Yes WBAT  FALLS:  Has patient fallen in last 6 months? No  LIVING ENVIRONMENT: Lives with: lives with their spouse Lives in: 1 story apartment Stairs: no Has following equipment at home: bilat crutches, shower chair  OCCUPATION: pt works for a [employer].  50% on feet/50% sitting.  PLOF: Independent  Pt was running 4 days per week, 3 miles per day.    PATIENT GOALS: get back to running, to be able to perform the normal daily activities including tying shoes, reduce pain.  NEXT MD VISIT: 07/19/23  OBJECTIVE:  Note: Objective measures were completed at Evaluation unless otherwise  noted.  LOWER EXTREMITY ROM:     Active  Right 4/16 Left 4/16  Hip flexion 105deg 103deg  Hip extension    Hip abduction 25deg 25deg  Hip adduction    Hip internal rotation 30deg 30deg  Hip external rotation 30deg 38deg  Knee flexion    Knee extension    Ankle dorsiflexion    Ankle plantarflexion    Ankle inversion    Ankle eversion     (Blank rows = not tested)    LOWER EXTREMITY MMT:    MMT Right 4/16 Left 4/16  Hip flexion    Hip extension 79.8 78.5  Hip abduction 56.5 48.2  Hip adduction    Hip internal rotation 43.5 38.0  Hip external rotation 43.8 41.9  Knee flexion    Knee extension 60.0 57.8  Ankle dorsiflexion    Ankle plantarflexion    Ankle inversion    Ankle eversion     (Blank rows = not tested)   DIAGNOSTIC FINDINGS: Pt had a MRI in July 2024 and is post op currently.   PATIENT SURVEYS:  LEFS 22/80 4/16: 67/80  COGNITION: Overall cognitive status: Within functional limits for tasks assessed                                                                                                                                    TREATMENT DATE:   5/2 Manual: All PROM performed with distraction to reduce pain and improve movement L hip PROM LAD L LE Posterior hip mobilizations STM to lateral glutes and piriformis in s/l There-ex: Childs pose 3x30sec TTN 2x5 Squats at railing with red rtb 2x10 Standing hip 3 way RTB at ankles 3x10 ea bilateral Lunge into hip flex 2x10 Cable walkouts 2x5 each side 15lbs (keep weight at midline)  4/28 Manual:   L hip PROM LAD L LE Posterior hip mobilizations Passive piriformis stretching STM to lateral glutes and piriformis in s/l   There-ex:  Child's pose stretch S/l hip abduction 3x10L Standing hip 3 way RTB at ankles 3x10 ea bilateral Retro lunges 2x5ea Diver exercise x10 each leg       4/25 Manual: All PROM performed with distraction to reduce pain and improve  movement Hip inf joint mob  Hip PA in 60deg flex STM trigger point therapy to lumbar paraspinals and anterior hip  Hip Posterior and inferior glides Grade II and III    There-ex: Quadruped cat/cow 5" hold x10 Standing hip 3 way RTB at ankles 2x10 ea bilateral Squats x10 Cable walks 25lb retro x8, 20lb fwd x8 There-Act  Neuro-Re-ed  Diver exercise 3x10 each leg    PATIENT EDUCATION:  Education details: rationale for interventions, HEP  Person educated: Patient Education method: Explanation, Demonstration, Tactile cues, Verbal cues Education comprehension: verbalized understanding, returned demonstration, verbal cues required, tactile cues required, and needs further education     HOME EXERCISE PROGRAM: Access Code: [email or web address] URL: [email or web address] Date: 07/18/2023 Prepared by: Mayme Spearman  Program Notes -with row, please perform tall kneeling as done in clinic  Exercises - Supine Heel Slide  - 2 x daily - 7 x weekly - 2-3 sets - 10 reps - Supine Posterior Pelvic Tilt  - 2 x daily - 7 x weekly - 2-3 sets - 10 reps - Beginner Bridge  - 3-4 x weekly - 1-2 sets - 8-10 reps - Quadruped Rocking Slow  - 3-4 x weekly - 1-2 sets - 8-10 reps - Quadruped Pelvic Tilt  - 3-4 x weekly - 1-2 sets - 10 reps - Stride Stance Weight Shift  - 3-4 x weekly - 1-2 sets - 10 reps - Tall Kneeling Anti-Rotation Press  - 3-4 x weekly - 2 sets - 8 reps - Standing Shoulder Row with Anchored Resistance  - 1 x daily - 3-4 x weekly - 2 sets - 8 reps - Sidelying Hip Abduction  - 1-2 x daily - 7 x weekly - 3 sets - 10 reps - Prone Hip Extension  - 1-2 x daily - 7 x weekly - 2 sets - 8 reps - Plank on Knees  - 3-4 x weekly - 2-3 sets - 15-20sec hold - Mini Squat  - 3-4 x weekly - 2-3 sets - 8 reps  ASSESSMENT:  CLINICAL IMPRESSION: 08/16/2023 Pt warmed up on the elliptical for with no increase in symptoms. Manual therapy was done with trigger point noted in QL region on  R side. Therapeutic exercises were done with no increase in symptoms or tightness in back and hips. Pt has demonstrated strong independence with HEP and is comfortable with symptom management thus far. Pt was educated on dynamic and static warm ups for running and demonstrated proper understanding. Pt has benefited from skilled physical therapy to decrease irritability, improve strength, and improve endurance for return to running. The patient will work on his exercises on his own. He will return if he has any flair-ups.     Eval: Patient is a 68 y.o. male 3 days s/p s/p Left hip pincer debridement with labral repair and CAM debridement presenting to the clinic with expected post op limitations including L hip pain, limited hip ROM, muscle weakness, and difficulty in walking.  Pt is limited with his normal functional mobility skills including ambulation and transfers.  Pt has difficulty with dressing.  Pt is an avid runner and is unable to run due to surgery.  He is not performing his normal work activities currently.  Pt should benefit from skilled PT per protocol to address impairments and improve overall function.      OBJECTIVE IMPAIRMENTS: Abnormal gait, decreased activity tolerance, decreased mobility, difficulty walking, decreased ROM, decreased strength, hypomobility, impaired flexibility, and pain.   ACTIVITY LIMITATIONS: carrying, lifting, bending, standing, squatting, stairs,  transfers, bed mobility, dressing, and locomotion level  PARTICIPATION LIMITATIONS: cleaning, community activity, occupation, and running  PERSONAL FACTORS: 1 comorbidity: cervical fusion  are also affecting patient's functional outcome.   REHAB POTENTIAL: Good  CLINICAL DECISION MAKING: Stable/uncomplicated  EVALUATION COMPLEXITY: Low   GOALS:   SHORT TERM GOALS:  Pt will be independent and compliant with HEP for improved pain, ROM, strength, and function.  Baseline: Goal status: MET 4/16 Target date:  07/05/2023  2.  Pt will progress with PROM per protocol without adverse effects for improved stiffness and mobility.  Baseline:  Goal status: MET 4/16 Target date: 07/05/2023  3.  Pt will wean off of crutches without adverse effects  Baseline:  Goal status: MET 4/16 Target date:  07/05/2023  4.  Pt will progress with exercises per protocol without adverse effects for improved strength and mobility.  Baseline:  Goal status: IN PROGRESS 4/16 Target date:  07/19/2023   5.  Pt will perform a 6 inch step up with good form and good stability.   Baseline:  Goal status: MET 4/17=6 Target date:  08/02/23   6.  Pt will ambulate with a normalized heel to toe gait pattern without limping.  Baseline:  Goal status: IN PROGRESS (limps after sitting for prolonged period) 4/16 Target date: 08/16/23   7.  Pt will have no pain and demo good form with squatting for improved function strength and to assist with functional mobility.  Baseline:  Goal status: IN PROGRESS 4/16  Target date:  08/23/2023     LONG TERM GOALS: Target date: 09/26/2023   Pt's L hip AROM will be [facility] for improved stiffness and daily mobility  Baseline:  Goal status: MET 4/16  2.  Pt will ambulate extended community distance without increased pain and significant difficulty.  Baseline:  Goal status: MET 4/16  3.  Pt will be able to perform all of his ADLs/IADLs and functional mobility skills without significant pain and limitation.  Baseline:  Goal status: IN PROGRESS 4/16  4.   Pt will be able to ascend and descend stairs with a reciprocal gait with good control without increased L hip pain.  Baseline:  Goal status: MET 4/16  5.  Pt will demo 4+/5 strength in L hip abd and ext, 4 to 4+/5 in L hip flex, and 5/5 in L knee ext for improved performance of and tolerance with functional mobility.  Baseline:  Goal status: IN PROGRESS 4/16    PLAN:  PT FREQUENCY:  2x/wk   PT DURATION: other: 8-9 weeks  PLANNED  INTERVENTIONS: 97164- PT Re-evaluation, 97110-Therapeutic exercises, 97530- Therapeutic activity, 97112- Neuromuscular re-education, 97535- Self Care, 16109- Manual therapy, [phone]- Gait training, [phone]- Aquatic Therapy, [phone]- Electrical stimulation (unattended), [phone]- Electrical stimulation (manual), [id number]- Ultrasound, Patient/Family education, Balance training, Stair training, Taping, Dry Needling, Joint mobilization, Scar mobilization, Cryotherapy, and Moist heat  PLAN FOR NEXT SESSION: Cont per Dr. Verline Glow labral repair protocol.   Herminia Lope Joanna Borawski SPT 08/16/2023 12:22 PM

## 2023-08-26 ENCOUNTER — Encounter: Payer: Self-pay | Admitting: Cardiology

## 2023-09-04 ENCOUNTER — Ambulatory Visit (INDEPENDENT_AMBULATORY_CARE_PROVIDER_SITE_OTHER)

## 2023-09-04 VITALS — Ht 69.0 in | Wt 203.0 lb

## 2023-09-04 DIAGNOSIS — Z0000 Encounter for general adult medical examination without abnormal findings: Secondary | ICD-10-CM

## 2023-09-04 NOTE — Progress Notes (Signed)
 Subjective:   Johnny Boyer is a 68 y.o. who presents for a Medicare Wellness preventive visit.  As a reminder, Annual Wellness Visits don't include a physical exam, and some assessments may be limited, especially if this visit is performed virtually. We may recommend an in-person follow-up visit with your provider if needed.  Visit Complete: Virtual I connected with  Johnny Boyer on 09/04/23 by a audio enabled telemedicine application and verified that I am speaking with the correct person using two identifiers.  Patient Location: Home  Provider Location: Home Office  I discussed the limitations of evaluation and management by telemedicine. The patient expressed understanding and agreed to proceed.  Vital Signs: Because this visit was a virtual/telehealth visit, some criteria may be missing or patient reported. Any vitals not documented were not able to be obtained and vitals that have been documented are patient reported.  VideoDeclined- This patient declined Librarian, academic. Therefore the visit was completed with audio only.  Persons Participating in Visit: Patient.  AWV Questionnaire: No: Patient Medicare AWV questionnaire was not completed prior to this visit.  Cardiac Risk Factors include: advanced age (>12men, >59 women);male gender;dyslipidemia     Objective:     Today's Vitals   09/04/23 0844  Weight: 203 lb (92.1 kg)  Height: 5\' 9"  (1.753 m)   Body mass index is 29.98 kg/m.     09/04/2023    9:06 AM 06/07/2023    9:52 AM 12/18/2022    8:12 AM 10/15/2022   11:16 AM 04/21/2020    9:05 AM 10/31/2019   11:36 AM  Advanced Directives  Does Patient Have a Medical Advance Directive? No No No No No No  Would patient like information on creating a medical advance directive?  No - Patient declined No - Patient declined Yes (MAU/Ambulatory/Procedural Areas - Information given) No - Patient declined No - Patient declined    Current  Medications (verified) Outpatient Encounter Medications as of 09/04/2023  Medication Sig   aspirin  EC 81 MG tablet Take 81 mg by mouth daily.   atorvastatin  (LIPITOR) 80 MG tablet TAKE 1 TABLET BY MOUTH EVERY DAY   aspirin  EC 325 MG tablet Take 1 tablet (325 mg total) by mouth daily.   oxyCODONE  (ROXICODONE ) 5 MG immediate release tablet Take 1 tablet (5 mg total) by mouth every 4 (four) hours as needed for severe pain (pain score 7-10) or breakthrough pain.   No facility-administered encounter medications on file as of 09/04/2023.    Allergies (verified) Patient has no known allergies.   History: Past Medical History:  Diagnosis Date   Hemorrhoids    Hyperlipidemia    Myocardial infarction (HCC) 2009   Sleep apnea    no cpap used did not tolerate, moderate osa   Past Surgical History:  Procedure Laterality Date   CARDIAC CATHETERIZATION  2009   stents to mid rca x 2   EVALUATION UNDER ANESTHESIA WITH HEMORRHOIDECTOMY N/A 04/21/2020   Procedure: HEMORRHOIDECTOMY X3 WITH LIGATION AND HEMORRHOIDOPEXY ANORECTAL EXAMINATION UNDER ANESTHESIA;  Surgeon: Candyce Champagne, MD;  Location: [facility];  Service: General;  Laterality: N/A;   FRACTURE SURGERY  1974   right wrist   SPINE SURGERY  2008 and 2009   cervical C 3 C4 C5 fusion   Family History  Problem Relation Age of Onset   Heart disease Mother    Hyperlipidemia Mother    Hyperlipidemia Sister    Hypertension Sister    Hyperlipidemia Brother  Hypertension Brother    Social History   Socioeconomic History   Marital status: Married    Spouse name: Shelagh Derrick   Number of children: 2   Years of education: Not on file   Highest education level: Doctorate  Occupational History   Occupation: RETIRED  Tobacco Use   Smoking status: Never   Smokeless tobacco: Never  Vaping Use   Vaping status: Never Used  Substance and Sexual Activity   Alcohol use: Yes    Comment: 1 or 2 beers per day   Drug use: Never    Sexual activity: Not on file  Other Topics Concern   Not on file  Social History Narrative   Lives with wife/2025   Social Drivers of Health   Financial Resource Strain: Low Risk  (09/04/2023)   Overall Financial Resource Strain (CARDIA)    Difficulty of Paying Living Expenses: Not hard at all  Food Insecurity: No Food Insecurity (09/04/2023)   Hunger Vital Sign    Worried About Running Out of Food in the Last Year: Never true    Ran Out of Food in the Last Year: Never true  Transportation Needs: No Transportation Needs (09/04/2023)   PRAPARE - Administrator, Civil Service (Medical): No    Lack of Transportation (Non-Medical): No  Physical Activity: Sufficiently Active (09/04/2023)   Exercise Vital Sign    Days of Exercise per Week: 7 days    Minutes of Exercise per Session: 40 min  Stress: No Stress Concern Present (09/04/2023)   [employer] of Occupational Health - Occupational Stress Questionnaire    Feeling of Stress : Not at all  Social Connections: Moderately Isolated (09/04/2023)   Social Connection and Isolation Panel [NHANES]    Frequency of Communication with Friends and Family: More than three times a week    Frequency of Social Gatherings with Friends and Family: More than three times a week    Attends Religious Services: Never    Database administrator or Organizations: No    Attends Engineer, structural: Never    Marital Status: Married    Tobacco Counseling Counseling given: Not Answered    Clinical Intake:  Pre-visit preparation completed: Yes  Pain : No/denies pain     BMI - recorded: 29.98 Nutritional Status: BMI 25 -29 Overweight Nutritional Risks: None Diabetes: No  Lab Results  Component Value Date   HGBA1C 5.6 12/24/2022   HGBA1C 5.9 12/19/2021   HGBA1C 5.7 (H) 12/10/2019     How often do you need to have someone help you when you read instructions, pamphlets, or other written materials from your doctor or  pharmacy?: 1 - Never  Interpreter Needed?: No  Information entered by :: Dallana Mavity, RMA   Activities of Daily Living     09/04/2023    8:50 AM 10/15/2022   11:14 AM  In your present state of health, do you have any difficulty performing the following activities:  Hearing? 0 0  Vision? 0 0  Difficulty concentrating or making decisions? 0 0  Walking or climbing stairs? 0 0  Dressing or bathing? 0 0  Doing errands, shopping? 0 0  Preparing Food and eating ? N N  Using the Toilet? N N  In the past six months, have you accidently leaked urine? N N  Do you have problems with loss of bowel control? N N  Managing your Medications? N N  Managing your Finances? N N  Housekeeping or managing your  Housekeeping? N N    Patient Care Team: Elvira Hammersmith, MD as PCP - General (Internal Medicine) Candyce Champagne, MD as Consulting Physician (General Surgery)  Indicate any recent Medical Services you may have received from other than [facility] providers in the past year (date may be approximate).     Assessment:    This is a routine wellness examination for Johnny Boyer.  Hearing/Vision screen Hearing Screening - Comments:: Denies hearing difficulties   Vision Screening - Comments:: Wears eyeglasses/ Dr. Sherrin Dollar,    Goals Addressed             This Visit's Progress    im happy where im at   On track      Depression Screen     09/04/2023    9:08 AM 12/24/2022    3:40 PM 10/15/2022   11:17 AM 12/19/2021    8:10 AM 12/15/2020    8:05 AM 12/15/2019    8:04 AM 12/03/2018    1:22 PM  PHQ 2/9 Scores  PHQ - 2 Score 0 0 0 0 0 0 0  PHQ- 9 Score 0          Fall Risk     09/04/2023    9:06 AM 12/24/2022    3:40 PM 10/15/2022   11:16 AM 10/13/2022    6:08 AM 12/19/2021    8:10 AM  Fall Risk   Falls in the past year? 0 0 0 0 0  Number falls in past yr: 0 0 0 0 0  Injury with Fall? 0 0 0 0 0  Risk for fall due to :  No Fall Risks No Fall Risks  No Fall Risks  Follow up Falls evaluation  completed;Falls prevention discussed Falls evaluation completed Falls prevention discussed  Falls evaluation completed    MEDICARE RISK AT HOME:  Medicare Risk at Home Any stairs in or around the home?: No If so, are there any without handrails?: No Home free of loose throw rugs in walkways, pet beds, electrical cords, etc?: Yes Adequate lighting in your home to reduce risk of falls?: Yes Life alert?: No Use of a cane, walker or w/c?: No Grab bars in the bathroom?: Yes Shower chair or bench in shower?: No Elevated toilet seat or a handicapped toilet?: No  TIMED UP AND GO:  Was the test performed?  No  Cognitive Function: Declined/Normal: No cognitive concerns noted by patient or family. Patient alert, oriented, able to answer questions appropriately and recall recent events. No signs of memory loss or confusion.        10/15/2022   11:18 AM  6CIT Screen  What Year? 0 points  What month? 0 points  What time? 0 points  Count back from 20 0 points  Months in reverse 0 points  Repeat phrase 0 points  Total Score 0 points    Immunizations Immunization History  Administered Date(s) Administered   Influenza, Seasonal, Injecte, Preservative Fre 12/24/2022   Influenza,inj,quad, With Preservative 02/12/2018   Influenza-Unspecified 01/05/2013, 03/01/2021   PFIZER(Purple Top)SARS-COV-2 Vaccination 06/23/2019, 07/21/2019, 03/08/2020, 08/14/2020, 03/21/2021   Pneumococcal Conjugate-13 12/15/2019   Tdap 01/05/2013   Zoster Recombinant(Shingrix) 12/15/2020    Screening Tests Health Maintenance  Topic Date Due   Pneumonia Vaccine 53+ Years old (2 of 2 - PPSV23) 02/09/2020   Zoster Vaccines- Shingrix (2 of 2) 02/09/2021   COVID-19 Vaccine (6 - 2024-25 season) 12/16/2022   DTaP/Tdap/Td (2 - Td or Tdap) 01/06/2023   Medicare Annual Wellness (AWV)  10/15/2023   INFLUENZA VACCINE  11/15/2023   Fecal DNA (Cologuard)  01/09/2026   Hepatitis C Screening  Completed   HPV VACCINES  Aged  Out   Meningococcal B Vaccine  Aged Out    Health Maintenance  Health Maintenance Due  Topic Date Due   Pneumonia Vaccine 21+ Years old (2 of 2 - PPSV23) 02/09/2020   Zoster Vaccines- Shingrix (2 of 2) 02/09/2021   COVID-19 Vaccine (6 - 2024-25 season) 12/16/2022   DTaP/Tdap/Td (2 - Td or Tdap) 01/06/2023   Medicare Annual Wellness (AWV)  10/15/2023   Health Maintenance Items Addressed: See Nurse Notes  Additional Screening:  Vision Screening: Recommended annual ophthalmology exams for early detection of glaucoma and other disorders of the eye.  Dental Screening: Recommended annual dental exams for proper oral hygiene  Community Resource Referral / Chronic Care Management: CRR required this visit?  No   CCM required this visit?  No   Plan:    I have personally reviewed and noted the following in the patient's chart:   Medical and social history Use of alcohol, tobacco or illicit drugs  Current medications and supplements including opioid prescriptions. Patient is not currently taking opioid prescriptions. Functional ability and status Nutritional status Physical activity Advanced directives List of other physicians Hospitalizations, surgeries, and ER visits in previous 12 months Vitals Screenings to include cognitive, depression, and falls Referrals and appointments  In addition, I have reviewed and discussed with patient certain preventive protocols, quality metrics, and best practice recommendations. A written personalized care plan for preventive services as well as general preventive health recommendations were provided to patient.   Idamay Hosein L Niles Ess, CMA   09/04/2023   After Visit Summary: (MyChart) Due to this being a telephonic visit, the after visit summary with patients personalized plan was offered to patient via MyChart   Notes: Please refer to Routing Comments.

## 2023-09-04 NOTE — Patient Instructions (Signed)
 Johnny Boyer , Thank you for taking time out of your busy schedule to complete your Annual Wellness Visit with me. I enjoyed our conversation and look forward to speaking with you again next year. I, as well as your care team,  appreciate your ongoing commitment to your health goals. Please review the following plan we discussed and let me know if I can assist you in the future. Your Game plan/ To Do List   Follow up Visits: Next Medicare AWV with our clinical staff: Per patient-I will be moving to another state before September, 2025.     Have you seen your provider in the last 6 months (3 months if uncontrolled diabetes)? No Next Office Visit with your provider: Patient stated that he will be moving out of state by end of summer.  He will request his records from here once he is at destination.    Clinician Recommendations:  Aim for 30 minutes of exercise or brisk walking, 6-8 glasses of water, and 5 servings of fruits and vegetables each day. You are due for a Shingles vaccine and a Tetanus vaccine.        This is a list of the screening recommended for you and due dates:  Health Maintenance  Topic Date Due   Pneumonia Vaccine (2 of 2 - PPSV23) 02/09/2020   Zoster (Shingles) Vaccine (2 of 2) 02/09/2021   COVID-19 Vaccine (6 - 2024-25 season) 12/16/2022   DTaP/Tdap/Td vaccine (2 - Td or Tdap) 01/06/2023   Medicare Annual Wellness Visit  10/15/2023   Flu Shot  11/15/2023   Cologuard (Stool DNA test)  01/09/2026   Hepatitis C Screening  Completed   HPV Vaccine  Aged Out   Meningitis B Vaccine  Aged Out    Advanced directives: (Copy Requested) Please bring a copy of your health care power of attorney and living will to the office to be added to your chart at your convenience. You can mail to [facility] [address] [place], [place] [address] or email to [email or web address] Advance Care Planning is important because it:  [x]  Makes sure you receive the medical  care that is consistent with your values, goals, and preferences  [x]  It provides guidance to your family and loved ones and reduces their decisional burden about whether or not they are making the right decisions based on your wishes.  Follow the link provided in your after visit summary or read over the paperwork we have mailed to you to help you started getting your Advance Directives in place. If you need assistance in completing these, please reach out to us  so that we can help you!  See attachments for Preventive Care and Fall Prevention Tips.

## 2023-09-06 ENCOUNTER — Ambulatory Visit (HOSPITAL_BASED_OUTPATIENT_CLINIC_OR_DEPARTMENT_OTHER): Admitting: Orthopaedic Surgery

## 2023-09-06 DIAGNOSIS — M25852 Other specified joint disorders, left hip: Secondary | ICD-10-CM

## 2023-09-06 NOTE — Progress Notes (Signed)
 Post Operative Evaluation    Procedure/Date of Surgery: Left hip arthroscopy with cam debridement 2/18  Interval History:   Presents today 12 weeks status post above procedure.  Overall he is doing very well.  He has been able to run 5 miles without significant pain.  PMH/PSH/Family History/Social History/Meds/Allergies:    Past Medical History:  Diagnosis Date   Hemorrhoids    Hyperlipidemia    Myocardial infarction (HCC) 2009   Sleep apnea    no cpap used did not tolerate, moderate osa   Past Surgical History:  Procedure Laterality Date   CARDIAC CATHETERIZATION  2009   stents to mid rca x 2   EVALUATION UNDER ANESTHESIA WITH HEMORRHOIDECTOMY N/A 04/21/2020   Procedure: HEMORRHOIDECTOMY X3 WITH LIGATION AND HEMORRHOIDOPEXY ANORECTAL EXAMINATION UNDER ANESTHESIA;  Surgeon: Candyce Champagne, MD;  Location: [facility];  Service: General;  Laterality: N/A;   FRACTURE SURGERY  1974   right wrist   SPINE SURGERY  2008 and 2009   cervical C 3 C4 C5 fusion   Social History   Socioeconomic History   Marital status: Married    Spouse name: Shelagh Derrick   Number of children: 2   Years of education: Not on file   Highest education level: Doctorate  Occupational History   Occupation: RETIRED  Tobacco Use   Smoking status: Never   Smokeless tobacco: Never  Vaping Use   Vaping status: Never Used  Substance and Sexual Activity   Alcohol use: Yes    Comment: 1 or 2 beers per day   Drug use: Never   Sexual activity: Not on file  Other Topics Concern   Not on file  Social History Narrative   Lives with wife/2025   Social Drivers of Health   Financial Resource Strain: Low Risk  (09/04/2023)   Overall Financial Resource Strain (CARDIA)    Difficulty of Paying Living Expenses: Not hard at all  Food Insecurity: No Food Insecurity (09/04/2023)   Hunger Vital Sign    Worried About Running Out of Food in the Last Year: Never true    Ran  Out of Food in the Last Year: Never true  Transportation Needs: No Transportation Needs (09/04/2023)   PRAPARE - Administrator, Civil Service (Medical): No    Lack of Transportation (Non-Medical): No  Physical Activity: Sufficiently Active (09/04/2023)   Exercise Vital Sign    Days of Exercise per Week: 7 days    Minutes of Exercise per Session: 40 min  Stress: No Stress Concern Present (09/04/2023)   [employer] of Occupational Health - Occupational Stress Questionnaire    Feeling of Stress : Not at all  Social Connections: Moderately Isolated (09/04/2023)   Social Connection and Isolation Panel [NHANES]    Frequency of Communication with Friends and Family: More than three times a week    Frequency of Social Gatherings with Friends and Family: More than three times a week    Attends Religious Services: Never    Database administrator or Organizations: No    Attends Banker Meetings: Never    Marital Status: Married   Family History  Problem Relation Age of Onset   Heart disease Mother    Hyperlipidemia Mother    Hyperlipidemia Sister    Hypertension Sister  Hyperlipidemia Brother    Hypertension Brother    No Known Allergies Current Outpatient Medications  Medication Sig Dispense Refill   aspirin  EC 325 MG tablet Take 1 tablet (325 mg total) by mouth daily. 14 tablet 0   aspirin  EC 81 MG tablet Take 81 mg by mouth daily.     atorvastatin  (LIPITOR) 80 MG tablet TAKE 1 TABLET BY MOUTH EVERY DAY 90 tablet 2   oxyCODONE  (ROXICODONE ) 5 MG immediate release tablet Take 1 tablet (5 mg total) by mouth every 4 (four) hours as needed for severe pain (pain score 7-10) or breakthrough pain. 15 tablet 0   No current facility-administered medications for this visit.   No results found.  Review of Systems:   A ROS was performed including pertinent positives and negatives as documented in the HPI.   Musculoskeletal Exam:    There were no vitals taken for  this visit.  Left hip incisions are well-appearing.  Range of motion is 30 degrees internal and external rotation about the left hip without pain.  There is excellent abduction strength with no antalgic gait  Imaging:      I personally reviewed and interpreted the radiographs.   Assessment:   12-week status post left hip arthroscopy with cam debridement overall doing extremely well.  He is now running without pain Plan :    - He will return to clinic as needed      I personally saw and evaluated the patient, and participated in the management and treatment plan.  Wilhelmenia Harada, MD Attending Physician, Orthopedic Surgery  This document was dictated using Dragon voice recognition software. A reasonable attempt at proof reading has been made to minimize errors.

## 2023-10-28 ENCOUNTER — Encounter: Payer: Self-pay | Admitting: Cardiology

## 2023-12-17 ENCOUNTER — Encounter: Payer: Self-pay | Admitting: Sports Medicine

## 2024-02-17 ENCOUNTER — Encounter: Payer: Self-pay | Admitting: Radiology
